# Patient Record
Sex: Female | Born: 1945 | Race: White | Hispanic: No | Marital: Married | Smoking: Never smoker
Health system: Southern US, Community
[De-identification: ages and names within clinical notes are randomized; demographics above are authoritative.]

## PROBLEM LIST (undated history)

## (undated) DIAGNOSIS — R011 Cardiac murmur, unspecified: Secondary | ICD-10-CM

## (undated) DIAGNOSIS — I4891 Unspecified atrial fibrillation: Secondary | ICD-10-CM

## (undated) DIAGNOSIS — I10 Essential (primary) hypertension: Secondary | ICD-10-CM

## (undated) HISTORY — DX: Unspecified atrial fibrillation: I48.91

## (undated) HISTORY — PX: ENDOMETRIAL BIOPSY: SHX622

## (undated) HISTORY — DX: Cardiac murmur, unspecified: R01.1

## (undated) HISTORY — PX: TOTAL KNEE ARTHROPLASTY: SHX125

## (undated) HISTORY — DX: Essential (primary) hypertension: I10

---

## 1997-12-19 ENCOUNTER — Encounter: Admission: RE | Admit: 1997-12-19 | Discharge: 1998-03-19 | Payer: Self-pay | Admitting: Internal Medicine

## 1998-01-30 ENCOUNTER — Ambulatory Visit (HOSPITAL_COMMUNITY): Admission: RE | Admit: 1998-01-30 | Discharge: 1998-01-31 | Payer: Self-pay | Admitting: General Surgery

## 1999-09-17 ENCOUNTER — Encounter: Payer: Self-pay | Admitting: Internal Medicine

## 1999-09-17 ENCOUNTER — Encounter: Admission: RE | Admit: 1999-09-17 | Discharge: 1999-09-17 | Payer: Self-pay | Admitting: Internal Medicine

## 2004-10-01 ENCOUNTER — Other Ambulatory Visit: Admission: RE | Admit: 2004-10-01 | Discharge: 2004-10-01 | Payer: Self-pay | Admitting: Obstetrics and Gynecology

## 2004-10-01 ENCOUNTER — Ambulatory Visit (HOSPITAL_COMMUNITY): Admission: RE | Admit: 2004-10-01 | Discharge: 2004-10-01 | Payer: Self-pay | Admitting: Internal Medicine

## 2008-07-08 ENCOUNTER — Ambulatory Visit (HOSPITAL_COMMUNITY): Admission: RE | Admit: 2008-07-08 | Discharge: 2008-07-08 | Payer: Self-pay | Admitting: Family Medicine

## 2008-09-05 HISTORY — PX: ROTATOR CUFF REPAIR: SHX139

## 2009-05-26 ENCOUNTER — Encounter (INDEPENDENT_AMBULATORY_CARE_PROVIDER_SITE_OTHER): Payer: Self-pay | Admitting: Obstetrics and Gynecology

## 2009-05-26 ENCOUNTER — Ambulatory Visit (HOSPITAL_COMMUNITY): Admission: RE | Admit: 2009-05-26 | Discharge: 2009-05-26 | Payer: Self-pay | Admitting: Obstetrics and Gynecology

## 2010-02-03 ENCOUNTER — Ambulatory Visit: Admission: RE | Admit: 2010-02-03 | Discharge: 2010-02-03 | Payer: Self-pay | Admitting: Gynecologic Oncology

## 2010-03-05 HISTORY — PX: ABDOMINAL HYSTERECTOMY: SHX81

## 2010-03-16 ENCOUNTER — Inpatient Hospital Stay (HOSPITAL_COMMUNITY): Admission: RE | Admit: 2010-03-16 | Discharge: 2010-03-19 | Payer: Self-pay | Admitting: Gynecologic Oncology

## 2010-03-16 ENCOUNTER — Encounter (INDEPENDENT_AMBULATORY_CARE_PROVIDER_SITE_OTHER): Payer: Self-pay | Admitting: Obstetrics and Gynecology

## 2010-03-17 ENCOUNTER — Encounter (INDEPENDENT_AMBULATORY_CARE_PROVIDER_SITE_OTHER): Payer: Self-pay | Admitting: Obstetrics and Gynecology

## 2010-04-22 ENCOUNTER — Ambulatory Visit: Admission: RE | Admit: 2010-04-22 | Discharge: 2010-04-22 | Payer: Self-pay | Admitting: Gynecologic Oncology

## 2010-05-24 ENCOUNTER — Inpatient Hospital Stay (HOSPITAL_COMMUNITY): Admission: RE | Admit: 2010-05-24 | Discharge: 2010-05-28 | Payer: Self-pay | Admitting: Orthopedic Surgery

## 2010-06-29 ENCOUNTER — Encounter
Admission: RE | Admit: 2010-06-29 | Discharge: 2010-08-04 | Payer: Self-pay | Source: Home / Self Care | Admitting: Orthopedic Surgery

## 2010-10-07 NOTE — H&P (Signed)
[name], [name]               ACCOUNT NO.:  [id number]  MEDICAL RECORD NO.:  [id number]          PATIENT TYPE:  INP  LOCATION:  NA                           FACILITY:  [facility]  PHYSICIAN:  Rozell Searing, [facility]    DATE OF BIRTH:  11-24-1945  DATE OF ADMISSION: DATE OF DISCHARGE:                             HISTORY & PHYSICAL   CHIEF COMPLAINT:  Right knee pain.  BRIEF HISTORY:  [name] has been followed by Dr. Lequita Halt for worsening pain in her right knee.  She has unfortunately failed conservative measures.  At this point, her right knee is keeping her from doing her daily activities.  She now presents for a right total knee arthroplasty.  MEDICATION ALLERGIES:  She has sensitivity to Codeine.  This causes nausea and vomiting.  Her allergy to sulfa drugs is a rash.  She has nausea and vomiting with Dilaudid IV.  However, she does fine with oral Dilaudid or with intravenous morphine.  PRIMARY CARE PHYSICIAN:  Dr. Sigmund Hazel  CURRENT MEDICATIONS: 1. Mobic 15 mg one tablet by mouth daily. 2. Lisinopril 20/25 mg one tablet by mouth daily. 3. Tylenol #4/Tramadol 32.5/37.5 one to two tablets by mouth every     four to six hours as needed for pain. 4. Dilaudid 2 mg one to two tablets by mouth every four to six hours     as needed for pain. 5. Zofran 4 mg one tablet by mouth every six hours as needed for     nausea.  PAST MEDICAL HISTORY: 1. Endstage arthritis of the right knee. 2. Hypertension. 3. Innocent heart murmur. 4. Arthritis. 5. History of bulging discs.  This was diagnosed by Dr. Ethelene Hal January     2012.  PAST SURGICAL HISTORY: 1. C-section in 1982. 2. Cholecystectomy in 1998. 3. Rotator cuff repair 2010. 4. Right knee medial meniscectomy 2010. 5. Total hysterectomy December of 2010. 6. Left total knee arthroplasty 2011.  FAMILY MEDICAL HISTORY:  Father passed at the age of 59.  He had heart disease.  Mother passed at the age of 37 of breast  cancer.  SOCIAL HISTORY:  The patient is divorced.  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  She denies past or present use of alcohol or tobacco products.  She has three children.  She lives at home.  She lives alone.  She does plan to go to skilled nursing facility following her hospital stay, and she would prefer [facility].  REASON FOR ADMISSION:  GENERAL:  Negative for fevers, chills or weight change. HEENT:  Positive for impaired vision.  The patient wears glasses. Negative for balance problems.  Negative for blackout spells. CARDIOVASCULAR:  Positive for heart murmur.  Negative for chest pain or palpitations. RESPIRATIONS:  Negative for shortness of breath at rest or with exertion. GASTROINTESTINAL:  Negative for nausea, vomiting or diarrhea. GENITOURINARY:  Negative for hematuria or dysuria. MUSCULOSKELETAL:  Positive for joint pain and swelling. DERMATOLOGIC:  Negative for rash or lesions.  PHYSICAL EXAMINATION:  VITAL SIGNS:  Pulse 70, respirations 18, blood pressure 130/80 in the left arm. GENERAL APPEARANCE:  Ms.  Clark is alert and oriented times three.  She is well-developed and well-nourished.  She does appear anxious on today's visit. HEENT:  Normocephalic, atraumatic.  Extraocular movements intact.  The patient wears glasses. NECK:  Supple with full range of motion without lymphadenopathy. CHEST:  Lungs are clear to auscultation bilaterally without wheezes, rhonchi or rales. HEART:  Regular rate and rhythm.  No murmur noted. ABDOMEN:  Bowel sounds present in all four quadrants.  Abdomen was soft, nontender and nondistended to palpation. EXTREMITIES:  Evaluation of the patient's right knee, she has pain with palpation over the medial joint line.  Decreased range of motion.  She has crepitus throughout the range of motion.  No instability was noted. No masses or tumors palpated in the popliteal space. SKIN:  Unremarkable. NEUROLOGIC:   Intact. PERIPHERAL VASCULAR:  Carotid pulses 2 plus bilaterally without bruits.  RADIOGRAPHS:  AP lateral views of the patient's right knee reveal endstage arthritis of the right knee, significant in the medial compartments as well as the patellofemoral.  IMPRESSION:  Endstage arthritis of the right knee.  PLAN:  Right total knee arthroplasty to be performed by Dr. Lequita Halt on October 19, 2010.  She will undergo all preoperative labs and testing at [facility].     Rozell Searing, [facility]     LD/MEDQ  D:  10/06/2010  T:  10/06/2010  Job:  [id number]  Electronically Signed by Rozell Searing  on 10/07/2010 11:48:59 AM

## 2010-10-11 ENCOUNTER — Encounter (HOSPITAL_COMMUNITY): Payer: PRIVATE HEALTH INSURANCE | Attending: Orthopedic Surgery

## 2010-10-11 LAB — APTT: aPTT: 36 seconds (ref 24–37)

## 2010-10-11 LAB — PROTIME-INR
INR: 1.05 (ref 0.00–1.49)
Prothrombin Time: 13.9 seconds (ref 11.6–15.2)

## 2010-10-11 LAB — COMPREHENSIVE METABOLIC PANEL
Alkaline Phosphatase: 127 U/L — ABNORMAL HIGH (ref 39–117)
BUN: 20 mg/dL (ref 6–23)
Calcium: 9.5 mg/dL (ref 8.4–10.5)
Creatinine, Ser: 0.88 mg/dL (ref 0.4–1.2)
Glucose, Bld: 89 mg/dL (ref 70–99)
Potassium: 4.4 mEq/L (ref 3.5–5.1)
Total Protein: 7.4 g/dL (ref 6.0–8.3)

## 2010-10-11 LAB — CBC
HCT: 39.7 % (ref 36.0–46.0)
Hemoglobin: 13.2 g/dL (ref 12.0–15.0)
MCH: 29.5 pg (ref 26.0–34.0)
MCHC: 33.2 g/dL (ref 30.0–36.0)
MCV: 88.6 fL (ref 78.0–100.0)
Platelets: 306 10*3/uL (ref 150–400)
RBC: 4.48 MIL/uL (ref 3.87–5.11)
RDW: 14 % (ref 11.5–15.5)
WBC: 11 10*3/uL — ABNORMAL HIGH (ref 4.0–10.5)

## 2010-10-11 LAB — SURGICAL PCR SCREEN
MRSA, PCR: NEGATIVE
Staphylococcus aureus: NEGATIVE

## 2010-10-12 LAB — URINE MICROSCOPIC-ADD ON

## 2010-10-12 LAB — URINALYSIS, ROUTINE W REFLEX MICROSCOPIC
Bilirubin Urine: NEGATIVE
Ketones, ur: NEGATIVE mg/dL
Leukocytes, UA: NEGATIVE
Nitrite: NEGATIVE
Specific Gravity, Urine: 1.023 (ref 1.005–1.030)
Urobilinogen, UA: 0.2 mg/dL (ref 0.0–1.0)

## 2010-10-19 ENCOUNTER — Inpatient Hospital Stay (HOSPITAL_COMMUNITY)
Admission: RE | Admit: 2010-10-19 | Discharge: 2010-10-23 | DRG: 470 | Disposition: A | Discharge status: Rehabilitation Facility | Payer: PRIVATE HEALTH INSURANCE | Attending: Orthopedic Surgery | Admitting: Orthopedic Surgery

## 2010-10-19 DIAGNOSIS — D62 Acute posthemorrhagic anemia: Secondary | ICD-10-CM | POA: Diagnosis not present

## 2010-10-19 DIAGNOSIS — I10 Essential (primary) hypertension: Secondary | ICD-10-CM | POA: Diagnosis present

## 2010-10-19 DIAGNOSIS — IMO0002 Reserved for concepts with insufficient information to code with codable children: Secondary | ICD-10-CM | POA: Diagnosis present

## 2010-10-19 DIAGNOSIS — M1710 Unilateral primary osteoarthritis, unspecified knee: Principal | ICD-10-CM | POA: Diagnosis present

## 2010-10-19 LAB — TYPE AND SCREEN
ABO/RH(D): A POS
Antibody Screen: NEGATIVE

## 2010-10-20 LAB — CBC
Platelets: 214 10*3/uL (ref 150–400)
RBC: 3.6 MIL/uL — ABNORMAL LOW (ref 3.87–5.11)
WBC: 8.9 10*3/uL (ref 4.0–10.5)

## 2010-10-20 LAB — BASIC METABOLIC PANEL
Chloride: 104 mEq/L (ref 96–112)
GFR calc Af Amer: >60 mL/min (ref >60)
Potassium: 4.4 mEq/L (ref 3.5–5.1)

## 2010-10-20 NOTE — Op Note (Signed)
NAMEAMRIT, ERCK               ACCOUNT NO.:  [id number]  MEDICAL RECORD NO.:  [id number]           PATIENT TYPE:  I  LOCATION:  1607                         FACILITY:  [facility]  PHYSICIAN:  Ollen Gross, M.D.    DATE OF BIRTH:  06-30-1946  DATE OF PROCEDURE:  10/19/2010 DATE OF DISCHARGE:                              OPERATIVE REPORT   PREOPERATIVE DIAGNOSIS:  Osteoarthritis, right knee.  POSTOPERATIVE DIAGNOSIS:  Osteoarthritis, right knee.  PROCEDURE:  Right total knee arthroplasty.  SURGEON:  Ollen Gross, M.D.  ASSISTANT:  Alexzandrew L. Perkins, P.A.C.  ANESTHESIA:  General.  ESTIMATED BLOOD LOSS:  Minimal.  DRAIN:  Hemovac x1.  TOURNIQUET TIME:  33 minutes and 300 mmHg.  COMPLICATIONS:  None.  CONDITION.:  Stable to recovery.  BRIEF CLINICAL NOTE:  [name] is a 65 year old female who had advanced arthritis of both knees.  She previously underwent a successful left total knee arthroplasty.  She had intractable pain in her right knee and presents now for right total knee arthroplasty.  PROCEDURE IN DETAIL:  After successful administration of general anesthetic, a tourniquet was placed high on her right thigh and her right lower extremity.  It was prepped and draped in usual sterile fashion.  Extremity was wrapped in Esmarch and knee flexed, tourniquet inflated to 300 mmHg.  Midline incision was made with #10 blade through subcutaneous tissue to the level of the extensor mechanism.  A fresh blade was used make a medial parapatellar arthrotomy.  Soft tissue on the proximal medial tibia subperiosteally elevated to the joint line with a knife into the semimembranosus bursa with a Cobb elevator.  Soft tissue laterally was elevated with attention being paid to avoiding the patellar tendon on tibial tubercle.  The patella was everted, knee flexed 90 degrees, and ACL and PCL removed.  Drill was used to create a starting hole in the distal femur and the canal was  thoroughly irrigated.  The 5-degree left valgus alignment guide was placed and the block was pinned to remove 10 mm off the distal femur.  Distal femoral resection was made with an oscillating saw.  Tibia subluxed forward and the menisci were removed.  The extramedullary tibial alignment guide was placed referencing proximally to medial aspect of the tibial tubercle and distally along the second metatarsal axis and tibial crest.  Block was pinned to remove 2 mm off the more deficient medial side.  Tibial resection was made with an oscillating saw.  Size 3 was most appropriate tibial component and the proximal tibia was prepared with modular drill and keel punch for the size 3.  The femoral sizing guide was placed, size 3 was most appropriate femur. Rotation was marked off the epicondylar axis and the size 3 cutting block was placed.  The rotation was confirmed by creating rectangular flexion gap at 90 degrees.  The anterior-posterior chamfer cuts were made.  Intercondylar block was placed and that cut was made.  Trial size 3 posterior stabilized femur was placed.  The size 10 mm posterior stabilized rotating platform insert trial was placed.  With the 10, full extension was achieved with excellent  varus-valgus and anterior- posterior balance throughout full range of motion.  The patella was everted and the thickness was measured to be 21 mm.  Freehand resection was taken to 12 mm, 35 template was placed, lug holes were drilled, trial patella was placed and it tracked normally.  Osteophytes were removed off the posterior femur with the trial in place.  All trials were removed and the cut bone surfaces prepared for pulsatile lavage. Cement was mixed and once ready for implantation, size 3 mobile bearing tibial tray, size 3 posterior stabilized femur and 35 patella were cemented into place.  Patella was held with a clamp.  Trial 10 mm insert was placed, knee held in full extension, all  extruded cement was removed.  When the cement fully hardened, then the permanent 10-mm posterior stabilized rotating platform femoral component was placed. The wound was then copiously irrigated with saline solution and the arthrotomy closed over Hemovac drain with interrupted #1 PDS.  Flexion against gravity to 135 degrees and patella tracked normally.  Tourniquet released after total time of 33 minutes.  The subcu was then closed with interrupted 2-0 Vicryl and subcuticular with running 4-0 Monocryl.  The catheter for Marcaine pain pump was placed and pump was initiated. Incisions cleaned and dried and Steri-Strips and a bulky sterile dressing were applied.  The leg was then placed into a knee immobilizer. She was awakened and transported to recovery in stable condition.     Ollen Gross, M.D.     FA/MEDQ  D:  10/19/2010  T:  10/20/2010  Job:  [id number]  Electronically Signed by Ollen Gross M.D. on 10/20/2010 02:55:01 PM

## 2010-10-21 LAB — CBC
HCT: 30.7 % — ABNORMAL LOW (ref 36.0–46.0)
Hemoglobin: 9.8 g/dL — ABNORMAL LOW (ref 12.0–15.0)
MCV: 91.9 fL (ref 78.0–100.0)
RBC: 3.34 MIL/uL — ABNORMAL LOW (ref 3.87–5.11)
WBC: 8.9 10*3/uL (ref 4.0–10.5)

## 2010-10-21 LAB — BASIC METABOLIC PANEL
BUN: 8 mg/dL (ref 6–23)
Chloride: 102 mEq/L (ref 96–112)
Glucose, Bld: 127 mg/dL — ABNORMAL HIGH (ref 70–99)
Potassium: 4.2 mEq/L (ref 3.5–5.1)

## 2010-10-22 ENCOUNTER — Inpatient Hospital Stay (HOSPITAL_COMMUNITY): Payer: PRIVATE HEALTH INSURANCE

## 2010-10-22 LAB — CBC
HCT: 29.8 % — ABNORMAL LOW (ref 36.0–46.0)
MCHC: 30.9 g/dL (ref 30.0–36.0)
MCV: 94.3 fL (ref 78.0–100.0)
RDW: 14 % (ref 11.5–15.5)
WBC: 8.1 10*3/uL (ref 4.0–10.5)

## 2010-10-27 NOTE — Discharge Summary (Signed)
[name], [name]               ACCOUNT NO.:  [id number]  MEDICAL RECORD NO.:  [id number]           PATIENT TYPE:  I  LOCATION:  1607                         FACILITY:  [facility]  PHYSICIAN:  Ollen Gross, M.D.    DATE OF BIRTH:  06-Jun-1946  DATE OF ADMISSION:  10/19/2010 DATE OF DISCHARGE:  10/22/2010                        DISCHARGE SUMMARY - REFERRING   ADMITTING DIAGNOSES: 1. Osteoarthritis, right knee. 2. Hypertension. 3. Heart murmur. 4. Osteoarthritis. 5. Degenerative disk disease/bulging disks.  DISCHARGE DIAGNOSES: 1. Osteoarthritis right knee, status post right total knee replacement     arthroplasty. 2. Postop acute blood loss anemia, did not require transfusion. 3. Hypertension. 4. Heart murmur. 5. Osteoarthritis. 6. Degenerative disk disease/bulging disks.  PROCEDURE:  October 19, 2010, right total knee.  Surgeon, Dr. Lequita Halt. Assistant, Patrica Duel, PA-C.  Anesthesia general.  Tourniquet time 33 minutes.  CONSULTS:  None.  BRIEF HISTORY:  The patient is a 65 year old female with end-stage arthritis of both knees, previously underwent a successful total knee and now presents for her right total knee.  LABORATORY DATA:  Preop CBC showed hemoglobin low of 13.2, hematocrit of 39.7, white cell count 11, platelets 306.  PT/INR 13.9 and 1.05 on admission with PTT of 36.  Chem panel on admission, elevated alkaline phosphatase of 127.  Remaining Chem panel all within normal limits. Preop UA, 0-2 white, 0-2 red, many bacteria, turbid color.  Blood group type A positive.  Nasal swabs were negative.  Staphylococcus aureus negative for MRSA.  Serial CBCs were followed, hemoglobin dropped down to 10.6 and 9.8, last done hemoglobin and hematocrit prior to discharge 9.2 and 29.8.  Serial BMETs were followed for 48 hours, glucose went up from 89 to 148 back down to 127, the remaining electrolytes remained within normal limits.  X-RAYS:  Two-view chest,  May 27, 2010, cardiomegaly without congestive heart failure, questionable pulmonary hyperinflation.  EKG dated April 30, 2010, sinus rhythm within normal limits and confirmed, did not see signature.  HOSPITAL COURSE:  The patient was admitted to [facility], taken to OR, and underwent above-stated procedure without complication. The patient tolerated procedure well, later transferred to recovery room and then to orthopedic floor.  Started on p.o. and IV analgesic for pain control.  Following surgery, given 24 hours postop IV antibiotics. Allowed to be weightbearing as tolerated.  She has had fair amount of pain through the night doing a little bit better on the morning of day #1, encouraged p.o. medications.  She did want to look into skilled nursing facility, so we got Social Work involved.  Hemoglobin was stable, looked good postop at 10.6.  She got up with therapy.  By day #2, she was doing a little bit better, pain was under better control, hemoglobin was 9.8, dressing was changed and incision looked good.  Kept her on low iron supplement for low hemoglobin.  FL-2 was sent out and beds were sought.  She was looking into [facility] and on the morning of day #3, we were waiting on final insurance approval; if it is approved, then the patient will be transferred at that time.  DISCHARGE PLAN: 1. Possible discharge to [facility] on October 22, 2010. 2. Discharge diagnoses, please see above. 3. Discharge medications, current medications at time of transfer     include Xarelto 10 mg p.o. daily.  She needs to stay on this for 12     more days, then discontinue the Xarelto.  Colace 100 mg p.o.     b.i.d.; Zofran 8 mg p.o. q.6 hours p.r.n. nausea; Nu-Iron 150 mg     p.o. daily for 2 more weeks, then discontinue the Nu-Iron; Tylenol     325 mg 1-2 every 4-6 hours as needed for mild pain, temperature, or     headache; Robaxin 500 mg p.o. q.6-8 hours p.r.n. spasm; Restoril  15     mg 1-2 p.o. nightly p.r.n. sleep; Dilaudid 2 mg 1 or 2 tablets     every 4 hours as needed for moderate pain. 4. Diet, as tolerated. 5. Activity, she is weightbearing as tolerated, total knee protocol.     Continue with PT and OT for gait training, ambulation, ADLs, range     of motion strengthening exercises for total knee protocol. 6. Followup.  The patient needs to follow up in the office on Tuesday,     February 28th, please contact the office at [phone] to help     arrange appointment followup care of this patient.  DISPOSITION:  Awaiting insurance approval, planned to go to skilled nursing facility.  CONDITION UPON DISCHARGE:  Improving, but final discharge is pending at insurance approval.     Alexzandrew L. Julien Girt, P.A.C.   ______________________________ Ollen Gross, M.D.    ALP/MEDQ  D:  10/22/2010  T:  10/22/2010  Job:  [id number]  cc:   Sigmund Hazel, M.D. Fax: [phone]  [facility]  Electronically Signed by Patrica Duel P.A.C. on 10/26/2010 07:47:34 AM Electronically Signed by Ollen Gross M.D. on 10/27/2010 04:47:44 PM

## 2010-11-18 LAB — CBC
HCT: 25.8 % — ABNORMAL LOW (ref 36.0–46.0)
HCT: 26.2 % — ABNORMAL LOW (ref 36.0–46.0)
HCT: 27.1 % — ABNORMAL LOW (ref 36.0–46.0)
Hemoglobin: 12.1 g/dL (ref 12.0–15.0)
Hemoglobin: 8.6 g/dL — ABNORMAL LOW (ref 12.0–15.0)
Hemoglobin: 8.8 g/dL — ABNORMAL LOW (ref 12.0–15.0)
Hemoglobin: 9.2 g/dL — ABNORMAL LOW (ref 12.0–15.0)
MCH: 29.4 pg (ref 26.0–34.0)
MCH: 29.5 pg (ref 26.0–34.0)
MCH: 29.5 pg (ref 26.0–34.0)
MCH: 29.8 pg (ref 26.0–34.0)
MCHC: 33.1 g/dL (ref 30.0–36.0)
MCHC: 33.3 g/dL (ref 30.0–36.0)
MCHC: 33.5 g/dL (ref 30.0–36.0)
MCHC: 33.8 g/dL (ref 30.0–36.0)
RBC: 3.1 MIL/uL — ABNORMAL LOW (ref 3.87–5.11)
RDW: 13.6 % (ref 11.5–15.5)
RDW: 14.3 % (ref 11.5–15.5)

## 2010-11-18 LAB — BASIC METABOLIC PANEL
BUN: 8 mg/dL (ref 6–23)
CO2: 31 mEq/L (ref 19–32)
Chloride: 106 mEq/L (ref 96–112)
Glucose, Bld: 116 mg/dL — ABNORMAL HIGH (ref 70–99)
Glucose, Bld: 143 mg/dL — ABNORMAL HIGH (ref 70–99)
Potassium: 4.3 mEq/L (ref 3.5–5.1)
Potassium: 4.3 mEq/L (ref 3.5–5.1)
Sodium: 139 mEq/L (ref 135–145)
Sodium: 141 mEq/L (ref 135–145)

## 2010-11-18 LAB — COMPREHENSIVE METABOLIC PANEL
AST: 24 U/L (ref 0–37)
CO2: 32 mEq/L (ref 19–32)
Calcium: 9.1 mg/dL (ref 8.4–10.5)
Creatinine, Ser: 0.72 mg/dL (ref 0.4–1.2)
GFR calc Af Amer: >60 mL/min (ref >60)
GFR calc non Af Amer: >60 mL/min (ref >60)

## 2010-11-18 LAB — APTT: aPTT: 35 seconds (ref 24–37)

## 2010-11-18 LAB — PROTIME-INR
INR: 1.07 (ref 0.00–1.49)
INR: 1.2 (ref 0.00–1.49)
INR: 1.45 (ref 0.00–1.49)
Prothrombin Time: 14.1 seconds (ref 11.6–15.2)

## 2010-11-18 LAB — URINALYSIS, ROUTINE W REFLEX MICROSCOPIC
Ketones, ur: NEGATIVE mg/dL
Nitrite: NEGATIVE
Protein, ur: NEGATIVE mg/dL
Urobilinogen, UA: 0.2 mg/dL (ref 0.0–1.0)

## 2010-11-21 LAB — BASIC METABOLIC PANEL
CO2: 29 mEq/L (ref 19–32)
Chloride: 104 mEq/L (ref 96–112)
Creatinine, Ser: 0.64 mg/dL (ref 0.4–1.2)
GFR calc Af Amer: >60 mL/min (ref >60)
Glucose, Bld: 164 mg/dL — ABNORMAL HIGH (ref 70–99)

## 2010-11-21 LAB — COMPREHENSIVE METABOLIC PANEL
Albumin: 3.7 g/dL (ref 3.5–5.2)
Alkaline Phosphatase: 100 U/L (ref 39–117)
BUN: 14 mg/dL (ref 6–23)
CO2: 33 mEq/L — ABNORMAL HIGH (ref 19–32)
Chloride: 102 mEq/L (ref 96–112)
Creatinine, Ser: 0.72 mg/dL (ref 0.4–1.2)
GFR calc non Af Amer: >60 mL/min (ref >60)
Glucose, Bld: 105 mg/dL — ABNORMAL HIGH (ref 70–99)
Potassium: 4.4 mEq/L (ref 3.5–5.1)
Total Bilirubin: 0.5 mg/dL (ref 0.3–1.2)

## 2010-11-21 LAB — DIFFERENTIAL
Basophils Absolute: 0 10*3/uL (ref 0.0–0.1)
Basophils Relative: 0 % (ref 0–1)
Monocytes Absolute: 0.3 10*3/uL (ref 0.1–1.0)
Neutro Abs: 4.1 10*3/uL (ref 1.7–7.7)
Neutrophils Relative %: 71 % (ref 43–77)

## 2010-11-21 LAB — CBC
HCT: 36.4 % (ref 36.0–46.0)
Hemoglobin: 10.7 g/dL — ABNORMAL LOW (ref 12.0–15.0)
Hemoglobin: 12.4 g/dL (ref 12.0–15.0)
MCH: 31 pg (ref 26.0–34.0)
MCH: 31 pg (ref 26.0–34.0)
MCV: 90.3 fL (ref 78.0–100.0)
MCV: 90.5 fL (ref 78.0–100.0)
RBC: 3.46 MIL/uL — ABNORMAL LOW (ref 3.87–5.11)
RBC: 4.02 MIL/uL (ref 3.87–5.11)
WBC: 5.8 10*3/uL (ref 4.0–10.5)

## 2010-11-21 LAB — ABO/RH: ABO/RH(D): A POS

## 2010-11-21 LAB — SURGICAL PCR SCREEN: Staphylococcus aureus: POSITIVE — AB

## 2010-11-24 ENCOUNTER — Ambulatory Visit: Payer: PRIVATE HEALTH INSURANCE | Attending: Orthopedic Surgery | Admitting: Physical Therapy

## 2010-11-24 DIAGNOSIS — IMO0001 Reserved for inherently not codable concepts without codable children: Secondary | ICD-10-CM | POA: Insufficient documentation

## 2010-11-24 DIAGNOSIS — Z96659 Presence of unspecified artificial knee joint: Secondary | ICD-10-CM | POA: Insufficient documentation

## 2010-11-24 DIAGNOSIS — M25569 Pain in unspecified knee: Secondary | ICD-10-CM | POA: Insufficient documentation

## 2010-11-24 DIAGNOSIS — M25669 Stiffness of unspecified knee, not elsewhere classified: Secondary | ICD-10-CM | POA: Insufficient documentation

## 2010-11-24 NOTE — Discharge Summary (Signed)
  [name], [name]               ACCOUNT NO.:  [id number]  MEDICAL RECORD NO.:  [id number]           PATIENT TYPE:  I  LOCATION:  1607                         FACILITY:  [facility]  PHYSICIAN:  Ollen Gross, M.D.    DATE OF BIRTH:  1946-06-24  DATE OF ADMISSION:  10/19/2010 DATE OF DISCHARGE:  10/23/2010                              DISCHARGE SUMMARY   ADDENDUM  ADMITTING AND DISCHARGE DIAGNOSIS:  Please see previously dictated summary.  PROCEDURE:  Right total knee.  HISTORY AND PREOPERATIVE LABORATORY DATA:  See previously dictated summary.  HOSPITAL COURSE:  There was a chance of possibility that the patient will be discharged to [facility] on Friday 17, 2012, however, bed was not available.  She had stay in the hospital another day.  She was not discharged until the following day, the Saturday, the 18th. Arrangements were being made.  Once she got final insurance approval and the bed was available, she was transferred over on October 23, 2010.  DISCHARGE MEDICATIONS/PLAN:  Please see previously dictated summary.  DISPOSITION:  Skilled nurse facility place.  CONDITION ON DISCHARGE:  Improved.     Alexzandrew L. Julien Girt, P.A.C.   ______________________________ Ollen Gross, M.D.    ALP/MEDQ  D:  11/09/2010  T:  11/10/2010  Job:  [id number]  cc:   Ollen Gross, M.D. Fax: [phone]  Sigmund Hazel, M.D. Fax: [phone]  [facility]  [employer]  Electronically Signed by Patrica Duel P.A.C. on 11/11/2010 10:35:38 AM Electronically Signed by Ollen Gross M.D. on 11/24/2010 08:16:37 AM

## 2010-11-25 ENCOUNTER — Ambulatory Visit: Payer: PRIVATE HEALTH INSURANCE | Admitting: Physical Therapy

## 2010-11-30 ENCOUNTER — Ambulatory Visit: Payer: PRIVATE HEALTH INSURANCE | Admitting: Physical Therapy

## 2010-12-02 ENCOUNTER — Ambulatory Visit: Payer: PRIVATE HEALTH INSURANCE | Attending: Orthopedic Surgery | Admitting: Physical Therapy

## 2010-12-02 DIAGNOSIS — IMO0001 Reserved for inherently not codable concepts without codable children: Secondary | ICD-10-CM | POA: Insufficient documentation

## 2010-12-02 DIAGNOSIS — M25569 Pain in unspecified knee: Secondary | ICD-10-CM | POA: Insufficient documentation

## 2010-12-02 DIAGNOSIS — M25669 Stiffness of unspecified knee, not elsewhere classified: Secondary | ICD-10-CM | POA: Insufficient documentation

## 2010-12-02 DIAGNOSIS — Z96659 Presence of unspecified artificial knee joint: Secondary | ICD-10-CM | POA: Insufficient documentation

## 2010-12-07 ENCOUNTER — Ambulatory Visit: Payer: PRIVATE HEALTH INSURANCE | Attending: Orthopedic Surgery | Admitting: Physical Therapy

## 2010-12-07 DIAGNOSIS — M25669 Stiffness of unspecified knee, not elsewhere classified: Secondary | ICD-10-CM | POA: Insufficient documentation

## 2010-12-07 DIAGNOSIS — IMO0001 Reserved for inherently not codable concepts without codable children: Secondary | ICD-10-CM | POA: Insufficient documentation

## 2010-12-07 DIAGNOSIS — Z96659 Presence of unspecified artificial knee joint: Secondary | ICD-10-CM | POA: Insufficient documentation

## 2010-12-07 DIAGNOSIS — M25569 Pain in unspecified knee: Secondary | ICD-10-CM | POA: Insufficient documentation

## 2010-12-10 LAB — BASIC METABOLIC PANEL
BUN: 9 mg/dL (ref 6–23)
CO2: 31 mEq/L (ref 19–32)
Chloride: 99 mEq/L (ref 96–112)
Glucose, Bld: 85 mg/dL (ref 70–99)
Potassium: 3.5 mEq/L (ref 3.5–5.1)

## 2010-12-10 LAB — URINALYSIS, ROUTINE W REFLEX MICROSCOPIC
Bilirubin Urine: NEGATIVE
Ketones, ur: NEGATIVE mg/dL
Nitrite: NEGATIVE
Protein, ur: NEGATIVE mg/dL
Urobilinogen, UA: 0.2 mg/dL (ref 0.0–1.0)

## 2010-12-10 LAB — CBC
HCT: 37.8 % (ref 36.0–46.0)
MCV: 91.2 fL (ref 78.0–100.0)
Platelets: 273 10*3/uL (ref 150–400)
RDW: 14.5 % (ref 11.5–15.5)

## 2010-12-14 ENCOUNTER — Ambulatory Visit: Payer: PRIVATE HEALTH INSURANCE | Admitting: Physical Therapy

## 2010-12-16 ENCOUNTER — Ambulatory Visit: Payer: PRIVATE HEALTH INSURANCE | Admitting: Physical Therapy

## 2010-12-21 ENCOUNTER — Encounter: Payer: PRIVATE HEALTH INSURANCE | Admitting: Physical Therapy

## 2011-06-07 LAB — CREATININE, SERUM: GFR calc non Af Amer: >60

## 2011-06-07 LAB — BUN: BUN: 11

## 2011-08-19 IMAGING — CR DG CHEST 2V
2 series · 2 of 2 positions shown · non-contrast
Comparison: 03/15/2010

CLINICAL DATA: Postop for knee arthroplasty.  Low saturation.
Cough.

CHEST - 2 VIEW

[w chest lat *]
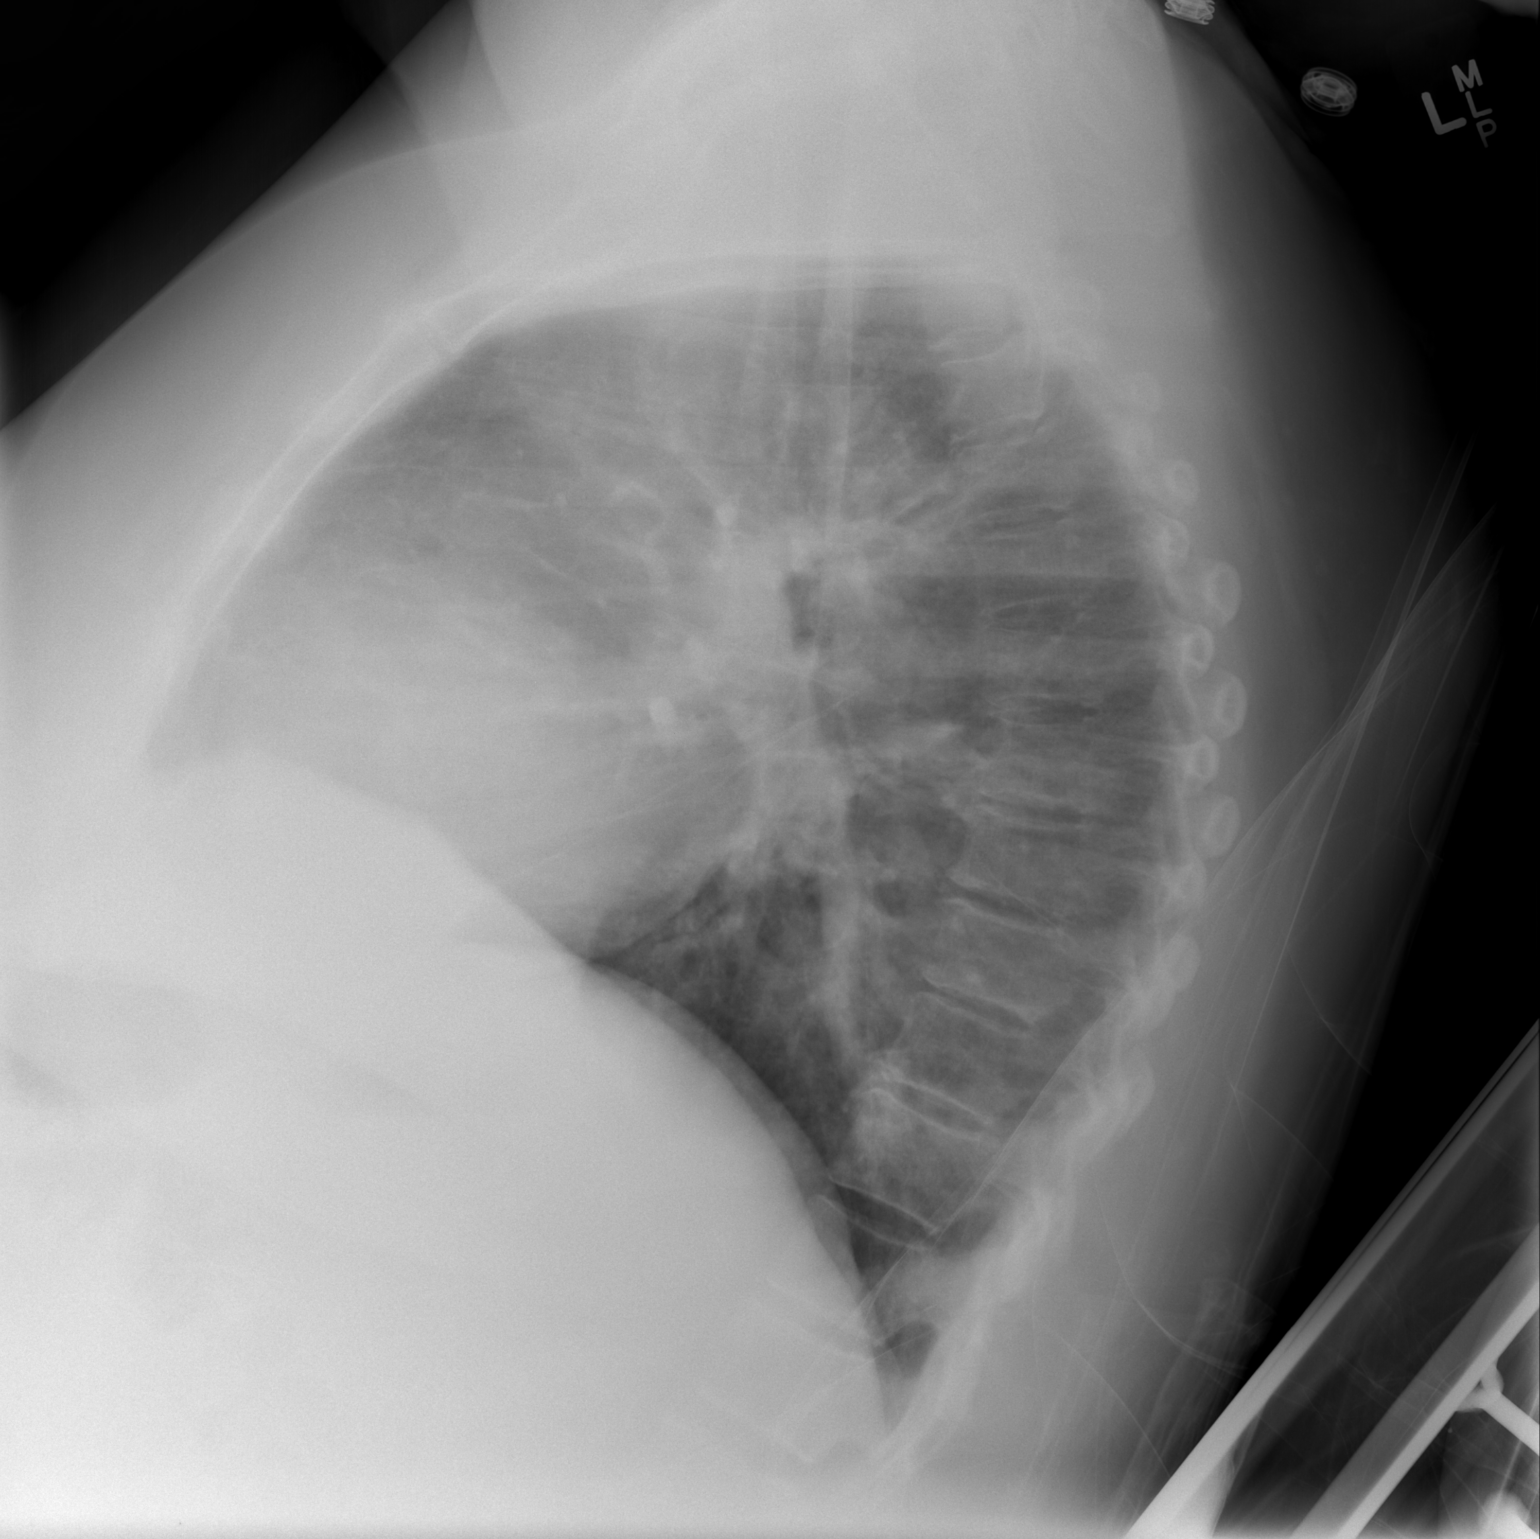

[view not recorded]
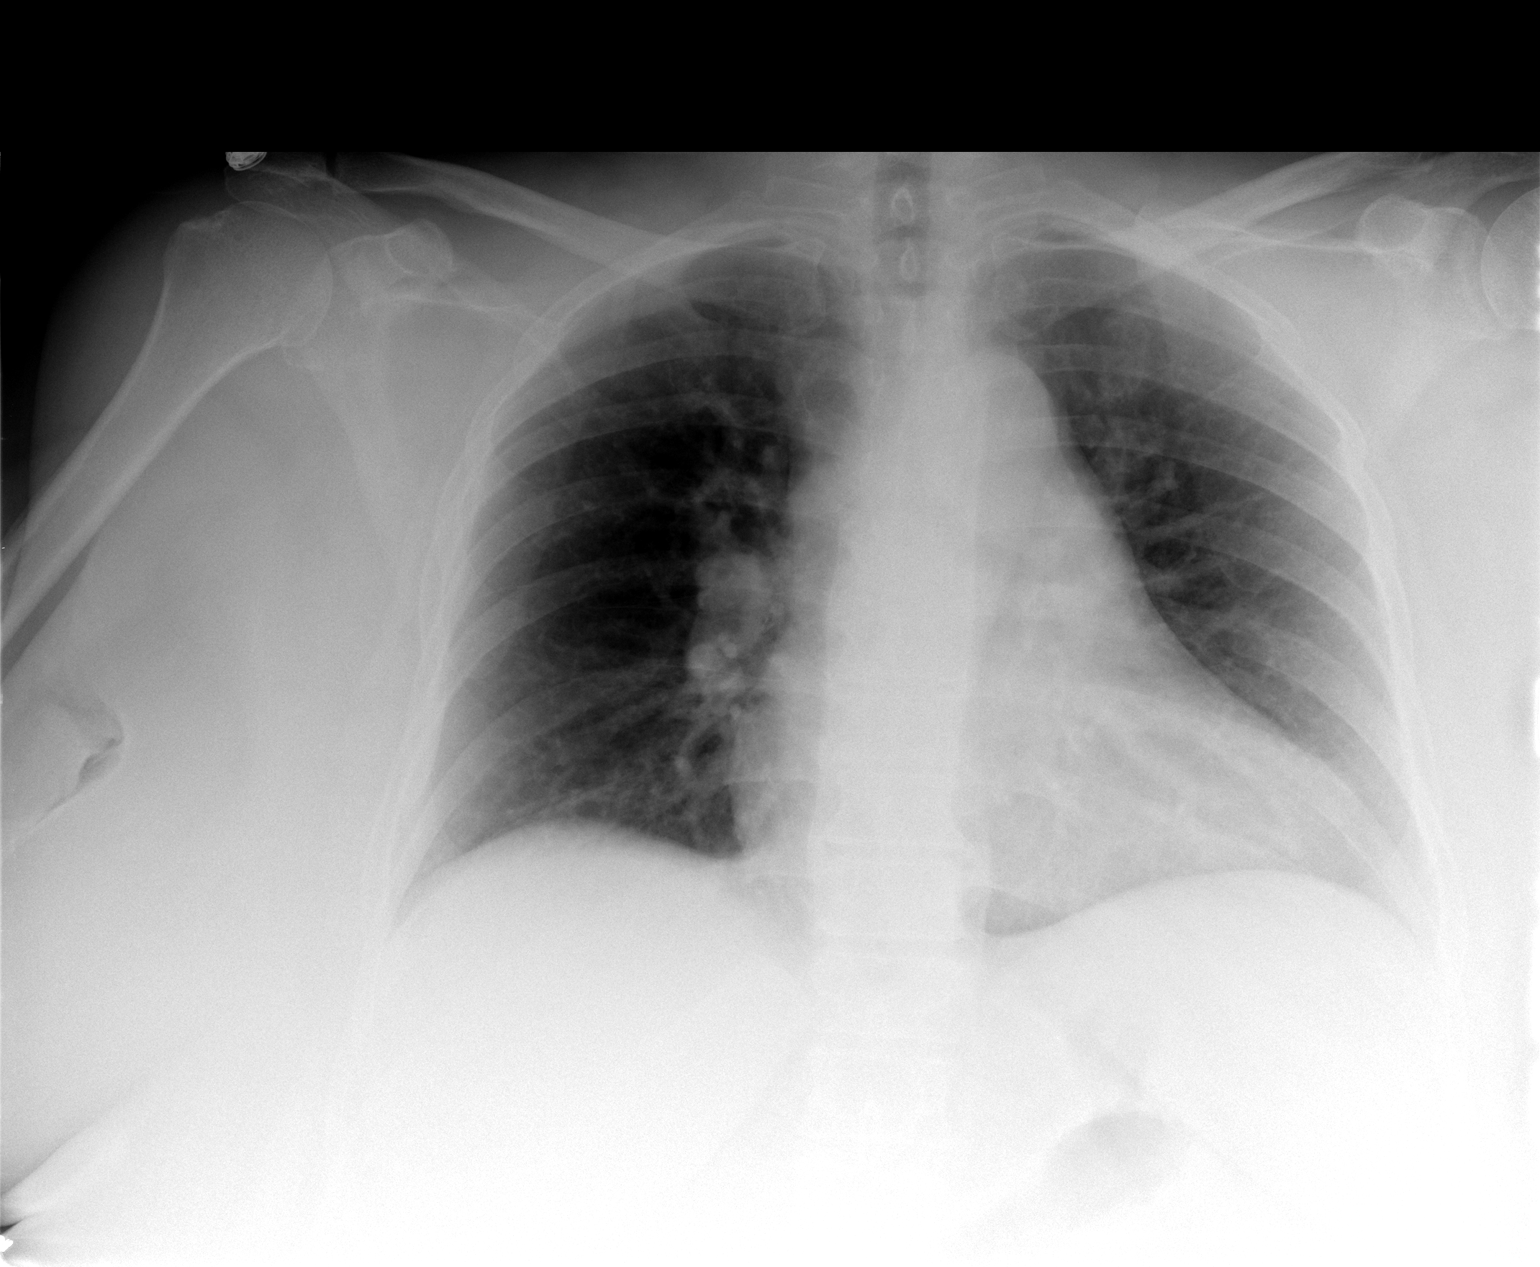

[2 of 2 positions shown; findings below may reference images not displayed]

FINDINGS: Increased A - P diameter of the chest suggests
hyperinflation.  Mid thoracic spondylosis is mild. Midline trachea.
Mild cardiomegaly. No pleural effusion or pneumothorax.  No
congestive failure.

Clear lungs.
IMPRESSION: 1. Cardiomegaly without congestive failure.
2.  Question pulmonary hyperinflation.

## 2013-09-16 ENCOUNTER — Encounter: Payer: Self-pay | Admitting: Obstetrics and Gynecology

## 2013-11-07 ENCOUNTER — Ambulatory Visit (INDEPENDENT_AMBULATORY_CARE_PROVIDER_SITE_OTHER): Payer: Commercial Managed Care - HMO | Admitting: Obstetrics and Gynecology

## 2013-11-07 ENCOUNTER — Encounter: Payer: Self-pay | Admitting: Obstetrics and Gynecology

## 2013-11-07 VITALS — BP 120/82 | HR 80 | Ht 61.5 in | Wt 248.0 lb

## 2013-11-07 DIAGNOSIS — Z01419 Encounter for gynecological examination (general) (routine) without abnormal findings: Secondary | ICD-10-CM

## 2013-11-07 DIAGNOSIS — Z13820 Encounter for screening for osteoporosis: Secondary | ICD-10-CM

## 2013-11-07 NOTE — Progress Notes (Signed)
Patient ID: [name], female   DOB: 07-Mar-1946, 68 y.o.   MRN: [id number] GYNECOLOGY VISIT  PCP:    Kathyrn Lass, MD  Referring provider:   HPI: 68 y.o.   Divorced  Caucasian  female   [phone] with No LMP recorded. Patient has had a hysterectomy.   here for  AEX.   Some urinary incontinence if coughs hard.   Always working on her weight.  Hgb:    PCP Urine:  Not done  GYNECOLOGIC HISTORY: No LMP recorded. Patient has had a hysterectomy. Sexually active:  no Partner preference: female Contraception:   Hysterectomy Menopausal hormone therapy: no DES exposure: no  Blood transfusions:  no  Sexually transmitted diseases:   no GYN procedures and prior surgeries:  TAH/BSO - complex atypical hyperplasia extending into adenomyosis, leiomyomata, focal endosalpingiosis of ovaries.  Last mammogram:  12/2009 wnl:[facility]               Last pap and high risk HPV testing:   03/2009 wnl:no HPV testing History of abnormal pap smear:  no   OB History   Grav Para Term Preterm Abortions TAB SAB Ect Mult Living   3 3 3       3        LIFESTYLE: Exercise:    no           Tobacco:    no Alcohol:      no Drug use:   no  OTHER HEALTH MAINTENANCE: Tetanus/TDap:    2008? Gardisil:               n/a Influenza:             2014 Zostavax:              never  Bone density:       never Colonoscopy:       Never, Will do in near future.   Cholesterol check:  09/2013 wnl with PCP  Family History  Problem Relation Age of Onset  . Breast cancer Mother   . Breast cancer Paternal Aunt   . Stroke Paternal Aunt     There are no active problems to display for this patient.  Past Medical History  Diagnosis Date  . Heart murmur     --innocent  . Hypertension     Past Surgical History  Procedure Laterality Date  . Abdominal hysterectomy  03/2010    TAH/BSO  . Total knee arthroplasty Bilateral   . Cesarean section  1981  . Rotator cuff repair Left 2010    ALLERGIES: Sulfa  antibiotics  Current Outpatient Prescriptions  Medication Sig Dispense Refill  . lisinopril-hydrochlorothiazide (PRINZIDE,ZESTORETIC) 20-25 MG per tablet Take 20-25 tablets by mouth daily.      . Multiple Vitamin (MULTIVITAMIN) capsule Take 1 capsule by mouth daily.      . vitamin B-12 (CYANOCOBALAMIN) 100 MCG tablet Take 100 mcg by mouth daily.       No current facility-administered medications for this visit.     ROS:  Pertinent items are noted in HPI.  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   PHYSICAL EXAMINATION:    BP 120/82  Pulse 80  Ht 5' 1.5" (1.562 m)  Wt 248 lb (112.492 kg)  BMI 46.11 kg/m2   Wt Readings from Last 3 Encounters:  11/07/13 248 lb (112.492 kg)     Ht Readings from Last 3 Encounters:  11/07/13 5' 1.5" (1.562 m)  General appearance: alert, cooperative and appears stated age Head: Normocephalic, without obvious abnormality, atraumatic Neck: no adenopathy, supple, symmetrical, trachea midline and thyroid not enlarged, symmetric, no tenderness/mass/nodules Lungs: clear to auscultation bilaterally Breasts: Inspection negative, No nipple retraction or dimpling, No nipple discharge or bleeding, No axillary or supraclavicular adenopathy, Normal to palpation without dominant masses Heart: regular rate and rhythm Abdomen: obese, soft, non-tender; no masses,  no organomegaly Extremities: extremities normal, atraumatic, no cyanosis or edema Skin: Skin color, texture, turgor normal. No rashes.  Multiple eschars on chest.  (Recent visit to dermatology.) Lymph nodes: Cervical, supraclavicular, and axillary nodes normal. No abnormal inguinal nodes palpated Neurologic: Grossly normal  Pelvic: External genitalia:  no lesions              Urethra:  normal appearing urethra with no masses, tenderness or lesions              Bartholins and Skenes: normal                 Vagina: normal appearing vagina with normal color and discharge, no lesions               Cervix: absent              Pap and high risk HPV testing done: no.            Bimanual Exam:  Uterus:  uterus is  Absent                                      Adnexa: nontender and no masses                                      Rectovaginal: Confirms                                      Anus:  normal sphincter tone, no lesions  ASSESSMENT  Normal gynecologic exam. Genuine stress incontinence.  PLAN  Mammogram recommended yearly.  Will schedule for patient. Pap smear and high risk HPV testing not indicted. Will schedule bone density for patient.  Counseled on self breast exam, exercise. Discussed Kegels, physical therapy and briefly mentioned surgery - the latter two declined.  Return annually or prn   An After Visit Summary was printed and given to the patient.

## 2013-11-07 NOTE — Patient Instructions (Signed)

## 2013-11-22 ENCOUNTER — Other Ambulatory Visit: Payer: PRIVATE HEALTH INSURANCE

## 2013-11-22 ENCOUNTER — Ambulatory Visit
Admission: RE | Admit: 2013-11-22 | Discharge: 2013-11-22 | Disposition: A | Discharge status: Home / Self Care | Payer: Commercial Managed Care - HMO | Source: Ambulatory Visit | Attending: Obstetrics and Gynecology | Admitting: Obstetrics and Gynecology

## 2013-11-22 DIAGNOSIS — Z01419 Encounter for gynecological examination (general) (routine) without abnormal findings: Secondary | ICD-10-CM

## 2014-01-30 ENCOUNTER — Telehealth: Payer: Self-pay | Admitting: Obstetrics and Gynecology

## 2014-01-30 NOTE — Telephone Encounter (Signed)
Calling patient to let her know records are here for her to pick up. Voicemail full unable to leave message. Left a automated call back number.

## 2014-07-07 ENCOUNTER — Encounter: Payer: Self-pay | Admitting: Obstetrics and Gynecology

## 2014-11-10 ENCOUNTER — Ambulatory Visit: Payer: Commercial Managed Care - HMO | Admitting: Obstetrics and Gynecology

## 2014-11-11 ENCOUNTER — Encounter: Payer: Self-pay | Admitting: Obstetrics and Gynecology

## 2015-03-17 ENCOUNTER — Telehealth: Payer: Self-pay | Admitting: Obstetrics and Gynecology

## 2015-03-17 NOTE — Telephone Encounter (Signed)
Left patient a message, details in staff message regarding dnka.

## 2015-04-07 ENCOUNTER — Encounter: Payer: Self-pay | Admitting: Obstetrics and Gynecology

## 2015-05-07 ENCOUNTER — Telehealth: Payer: Self-pay | Admitting: Obstetrics and Gynecology

## 2015-05-07 NOTE — Telephone Encounter (Signed)
LMTCB about canceled appointment °

## 2015-07-09 ENCOUNTER — Ambulatory Visit: Payer: Commercial Managed Care - HMO | Admitting: Obstetrics and Gynecology

## 2015-07-16 ENCOUNTER — Ambulatory Visit: Payer: Commercial Managed Care - HMO | Admitting: Obstetrics and Gynecology

## 2015-09-23 ENCOUNTER — Ambulatory Visit (INDEPENDENT_AMBULATORY_CARE_PROVIDER_SITE_OTHER): Payer: PPO | Admitting: Obstetrics and Gynecology

## 2015-09-23 ENCOUNTER — Encounter: Payer: Self-pay | Admitting: Obstetrics and Gynecology

## 2015-09-23 VITALS — BP 122/80 | HR 100 | Resp 20 | Ht 62.0 in | Wt 249.0 lb

## 2015-09-23 DIAGNOSIS — Z01419 Encounter for gynecological examination (general) (routine) without abnormal findings: Secondary | ICD-10-CM

## 2015-09-23 NOTE — Patient Instructions (Addendum)
[name],   I really encourage you to make the calls to the health department and social services to help your daughter get the care and resources she needs!  Erika Half, MD     EXERCISE AND DIET:  We recommended that you start or continue a regular exercise program for good health. Regular exercise means any activity that makes your heart beat faster and makes you sweat.  We recommend exercising at least 30 minutes per day at least 3 days a week, preferably 4 or 5.  We also recommend a diet low in fat and sugar.  Inactivity, poor dietary choices and obesity can cause diabetes, heart attack, stroke, and kidney damage, among others.    ALCOHOL AND SMOKING:  Women should limit their alcohol intake to no more than 7 drinks/beers/glasses of wine (combined, not each!) per week. Moderation of alcohol intake to this level decreases your risk of breast cancer and liver damage. And of course, no recreational drugs are part of a healthy lifestyle.  And absolutely no smoking or even second hand smoke. Most people know smoking can cause heart and lung diseases, but did you know it also contributes to weakening of your bones? Aging of your skin?  Yellowing of your teeth and nails?  CALCIUM AND VITAMIN D:  Adequate intake of calcium and Vitamin D are recommended.  The recommendations for exact amounts of these supplements seem to change often, but generally speaking 600 mg of calcium (either carbonate or citrate) and 800 units of Vitamin D per day seems prudent. Certain women may benefit from higher intake of Vitamin D.  If you are among these women, your doctor will have told you during your visit.    PAP SMEARS:  Pap smears, to check for cervical cancer or precancers,  have traditionally been done yearly, although recent scientific advances have shown that most women can have pap smears less often.  However, every woman still should have a physical exam from her gynecologist every year. It will include a breast  check, inspection of the vulva and vagina to check for abnormal growths or skin changes, a visual exam of the cervix, and then an exam to evaluate the size and shape of the uterus and ovaries.  And after 68 years of age, a rectal exam is indicated to check for rectal cancers. We will also provide age appropriate advice regarding health maintenance, like when you should have certain vaccines, screening for sexually transmitted diseases, bone density testing, colonoscopy, mammograms, etc.   MAMMOGRAMS:  All women over 68 years old should have a yearly mammogram. Many facilities now offer a "3D" mammogram, which may cost around $50 extra out of pocket. If possible,  we recommend you accept the option to have the 3D mammogram performed.  It both reduces the number of women who will be called back for extra views which then turn out to be normal, and it is better than the routine mammogram at detecting truly abnormal areas.    COLONOSCOPY:  Colonoscopy to screen for colon cancer is recommended for all women at age 68.  We know, you hate the idea of the prep.  We agree, BUT, having colon cancer and not knowing it is worse!!  Colon cancer so often starts as a polyp that can be seen and removed at colonscopy, which can quite literally save your life!  And if your first colonoscopy is normal and you have no family history of colon cancer, most women don't have to have  it again for 10 years.  Once every ten years, you can do something that may end up saving your life, right?  We will be happy to help you get it scheduled when you are ready.  Be sure to check your insurance coverage so you understand how much it will cost.  It may be covered as a preventative service at no cost, but you should check your particular policy.

## 2015-09-23 NOTE — Progress Notes (Signed)
Patient ID: [name], female   DOB: October 08, 1945, 70 y.o.   MRN: [id number] 70 y.o. [id number] Divorced Caucasian female here for annual exam.    Daughter not well and living with patient.  Patient with a lot of stress.  Still has urinary incontinene.  Leaks spontaneously.  Had a reaction to the Pneumovax injection.  Needs to get her Shingles vaccine.   Wants to start going to the [facility].  PCP: Kathyrn Lass, MD  - labs with PCP.   No LMP recorded. Patient has had a hysterectomy.          Sexually active: No. female The current method of family planning is status post hysterectomy.    Exercising: No.   Smoker:  no  Health Maintenance: Pap:  03/2009 Neg History of abnormal Pap:  no MMG:  11-22-13 3D/Density Cat.A/Neg/BiRads1:[facility] Colonoscopy:  NEVER BMD:   2015  Result  Normal with PCP([facility]) TDaP:  2010 Screening Labs:  Hb today: PCP, Urine today: PCP   reports that she has never smoked. She does not have any smokeless tobacco history on file. She reports that she does not drink alcohol.  Past Medical History  Diagnosis Date  . Heart murmur     --innocent  . Hypertension     Past Surgical History  Procedure Laterality Date  . Abdominal hysterectomy  03/2010    TAH/BSO  . Total knee arthroplasty Bilateral   . Cesarean section  1981  . Rotator cuff repair Left 2010  . 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 lisinopril-hydrochlorothiazide (PRINZIDE,ZESTORETIC) 20-25 MG per tablet Take 20-25 tablets by mouth daily.    . Multiple Vitamin (MULTIVITAMIN) capsule Take 1 capsule by mouth daily.    . ondansetron (ZOFRAN) 4 MG tablet Take 1 tablet by mouth as needed.  0  . vitamin B-12 (CYANOCOBALAMIN) 100 MCG tablet Take 100 mcg by mouth daily.     No current facility-administered medications for this visit.    Family History  Problem Relation Age of  Onset  . Breast cancer Mother   . Breast cancer Paternal Aunt   . Stroke Paternal Aunt     ROS:  Pertinent items are noted in HPI.  Otherwise, a comprehensive ROS was negative.  Exam:   BP 122/80 mmHg  Pulse 100  Resp 20  Ht 5\' 2"  (1.575 m)  Wt 249 lb (112.946 kg)  BMI 45.53 kg/m2    General appearance: alert, cooperative and appears stated age Head: Normocephalic, without obvious abnormality, atraumatic Neck: no adenopathy, supple, symmetrical, trachea midline and thyroid normal to inspection and palpation Lungs: clear to auscultation bilaterally Breasts: normal appearance, no masses or tenderness, Inspection negative, No nipple retraction or dimpling, No nipple discharge or bleeding, No axillary or supraclavicular adenopathy Heart: regular rate and rhythm Abdomen: central obesity, soft, non-tender; bowel sounds normal; no masses,  no organomegaly Extremities: extremities normal, atraumatic, no cyanosis or edema Skin: Skin color, texture, turgor normal. No rashes or lesions Lymph nodes: Cervical, supraclavicular, and axillary nodes normal. No abnormal inguinal nodes palpated Neurologic: Grossly normal  Pelvic: External genitalia:  no lesions              Urethra:  normal appearing urethra with no masses, tenderness or lesions              Bartholins and Skenes: normal  Vagina: normal appearing vagina with normal color and discharge, no lesions              Cervix: absent              Pap taken: No. Bimanual Exam:  Uterus:  uterus absent.  Minor scarring at vaginal apex.               Adnexa: no mass, fullness, tenderness and Exam limited by body habitus.              Rectovaginal: Yes.  .  Confirms.              Anus:  normal sphincter tone, no lesions  Chaperone was present for exam.  Assessment:   Well woman visit with normal exam. Status post TAH/BSO.  Urinary incontinence. No prior colonoscopy.   Plan: Yearly mammogram recommended after age 40.   Patient will schedule.  Recommended self breast exam.  Pap and HR HPV as above. Discussed Calcium, Vitamin D, regular exercise program including cardiovascular and weight bearing exercise.  Physical activity encouraged.  Labs performed.  No..    Refills given on medications.  No..   Patient will schedule her colonoscopy.  Support given to help daughter obtain health care, social services, and financial resources.  Follow up annually and prn.     After visit summary provided.

## 2015-11-05 DIAGNOSIS — R635 Abnormal weight gain: Secondary | ICD-10-CM | POA: Diagnosis not present

## 2015-11-05 DIAGNOSIS — N951 Menopausal and female climacteric states: Secondary | ICD-10-CM | POA: Diagnosis not present

## 2015-11-19 DIAGNOSIS — R7301 Impaired fasting glucose: Secondary | ICD-10-CM | POA: Diagnosis not present

## 2015-11-19 DIAGNOSIS — E538 Deficiency of other specified B group vitamins: Secondary | ICD-10-CM | POA: Diagnosis not present

## 2015-11-19 DIAGNOSIS — N958 Other specified menopausal and perimenopausal disorders: Secondary | ICD-10-CM | POA: Diagnosis not present

## 2015-11-19 DIAGNOSIS — Z6841 Body Mass Index (BMI) 40.0 and over, adult: Secondary | ICD-10-CM | POA: Diagnosis not present

## 2015-11-19 DIAGNOSIS — E039 Hypothyroidism, unspecified: Secondary | ICD-10-CM | POA: Diagnosis not present

## 2015-12-03 DIAGNOSIS — Z6841 Body Mass Index (BMI) 40.0 and over, adult: Secondary | ICD-10-CM | POA: Diagnosis not present

## 2015-12-03 DIAGNOSIS — E039 Hypothyroidism, unspecified: Secondary | ICD-10-CM | POA: Diagnosis not present

## 2015-12-03 DIAGNOSIS — E538 Deficiency of other specified B group vitamins: Secondary | ICD-10-CM | POA: Diagnosis not present

## 2015-12-03 DIAGNOSIS — R7301 Impaired fasting glucose: Secondary | ICD-10-CM | POA: Diagnosis not present

## 2015-12-11 DIAGNOSIS — Z6841 Body Mass Index (BMI) 40.0 and over, adult: Secondary | ICD-10-CM | POA: Diagnosis not present

## 2015-12-11 DIAGNOSIS — E538 Deficiency of other specified B group vitamins: Secondary | ICD-10-CM | POA: Diagnosis not present

## 2015-12-11 DIAGNOSIS — R7301 Impaired fasting glucose: Secondary | ICD-10-CM | POA: Diagnosis not present

## 2015-12-11 DIAGNOSIS — E039 Hypothyroidism, unspecified: Secondary | ICD-10-CM | POA: Diagnosis not present

## 2015-12-17 DIAGNOSIS — Z6841 Body Mass Index (BMI) 40.0 and over, adult: Secondary | ICD-10-CM | POA: Diagnosis not present

## 2015-12-17 DIAGNOSIS — R7301 Impaired fasting glucose: Secondary | ICD-10-CM | POA: Diagnosis not present

## 2015-12-17 DIAGNOSIS — E538 Deficiency of other specified B group vitamins: Secondary | ICD-10-CM | POA: Diagnosis not present

## 2015-12-17 DIAGNOSIS — E039 Hypothyroidism, unspecified: Secondary | ICD-10-CM | POA: Diagnosis not present

## 2015-12-24 DIAGNOSIS — E039 Hypothyroidism, unspecified: Secondary | ICD-10-CM | POA: Diagnosis not present

## 2015-12-24 DIAGNOSIS — E538 Deficiency of other specified B group vitamins: Secondary | ICD-10-CM | POA: Diagnosis not present

## 2015-12-24 DIAGNOSIS — R7301 Impaired fasting glucose: Secondary | ICD-10-CM | POA: Diagnosis not present

## 2015-12-24 DIAGNOSIS — Z6841 Body Mass Index (BMI) 40.0 and over, adult: Secondary | ICD-10-CM | POA: Diagnosis not present

## 2015-12-31 DIAGNOSIS — E538 Deficiency of other specified B group vitamins: Secondary | ICD-10-CM | POA: Diagnosis not present

## 2015-12-31 DIAGNOSIS — Z6841 Body Mass Index (BMI) 40.0 and over, adult: Secondary | ICD-10-CM | POA: Diagnosis not present

## 2015-12-31 DIAGNOSIS — N958 Other specified menopausal and perimenopausal disorders: Secondary | ICD-10-CM | POA: Diagnosis not present

## 2015-12-31 DIAGNOSIS — E039 Hypothyroidism, unspecified: Secondary | ICD-10-CM | POA: Diagnosis not present

## 2015-12-31 DIAGNOSIS — R7301 Impaired fasting glucose: Secondary | ICD-10-CM | POA: Diagnosis not present

## 2016-01-04 ENCOUNTER — Other Ambulatory Visit: Payer: Self-pay

## 2016-01-04 DIAGNOSIS — Z1231 Encounter for screening mammogram for malignant neoplasm of breast: Secondary | ICD-10-CM

## 2016-01-07 DIAGNOSIS — R7301 Impaired fasting glucose: Secondary | ICD-10-CM | POA: Diagnosis not present

## 2016-01-07 DIAGNOSIS — E039 Hypothyroidism, unspecified: Secondary | ICD-10-CM | POA: Diagnosis not present

## 2016-01-07 DIAGNOSIS — E538 Deficiency of other specified B group vitamins: Secondary | ICD-10-CM | POA: Diagnosis not present

## 2016-01-07 DIAGNOSIS — Z6841 Body Mass Index (BMI) 40.0 and over, adult: Secondary | ICD-10-CM | POA: Diagnosis not present

## 2016-01-14 DIAGNOSIS — R7301 Impaired fasting glucose: Secondary | ICD-10-CM | POA: Diagnosis not present

## 2016-01-14 DIAGNOSIS — E538 Deficiency of other specified B group vitamins: Secondary | ICD-10-CM | POA: Diagnosis not present

## 2016-01-14 DIAGNOSIS — Z6841 Body Mass Index (BMI) 40.0 and over, adult: Secondary | ICD-10-CM | POA: Diagnosis not present

## 2016-01-14 DIAGNOSIS — H9319 Tinnitus, unspecified ear: Secondary | ICD-10-CM | POA: Diagnosis not present

## 2016-01-14 DIAGNOSIS — H6980 Other specified disorders of Eustachian tube, unspecified ear: Secondary | ICD-10-CM | POA: Diagnosis not present

## 2016-01-14 DIAGNOSIS — E039 Hypothyroidism, unspecified: Secondary | ICD-10-CM | POA: Diagnosis not present

## 2016-01-14 DIAGNOSIS — I10 Essential (primary) hypertension: Secondary | ICD-10-CM | POA: Diagnosis not present

## 2016-01-21 DIAGNOSIS — Z6841 Body Mass Index (BMI) 40.0 and over, adult: Secondary | ICD-10-CM | POA: Diagnosis not present

## 2016-01-21 DIAGNOSIS — R7301 Impaired fasting glucose: Secondary | ICD-10-CM | POA: Diagnosis not present

## 2016-01-21 DIAGNOSIS — E039 Hypothyroidism, unspecified: Secondary | ICD-10-CM | POA: Diagnosis not present

## 2016-01-21 DIAGNOSIS — E538 Deficiency of other specified B group vitamins: Secondary | ICD-10-CM | POA: Diagnosis not present

## 2016-01-28 ENCOUNTER — Ambulatory Visit
Admission: RE | Admit: 2016-01-28 | Discharge: 2016-01-28 | Disposition: A | Discharge status: Home / Self Care | Payer: PPO | Source: Ambulatory Visit

## 2016-01-28 DIAGNOSIS — Z6841 Body Mass Index (BMI) 40.0 and over, adult: Secondary | ICD-10-CM | POA: Diagnosis not present

## 2016-01-28 DIAGNOSIS — R7301 Impaired fasting glucose: Secondary | ICD-10-CM | POA: Diagnosis not present

## 2016-01-28 DIAGNOSIS — E538 Deficiency of other specified B group vitamins: Secondary | ICD-10-CM | POA: Diagnosis not present

## 2016-01-28 DIAGNOSIS — Z1231 Encounter for screening mammogram for malignant neoplasm of breast: Secondary | ICD-10-CM

## 2016-01-28 DIAGNOSIS — E039 Hypothyroidism, unspecified: Secondary | ICD-10-CM | POA: Diagnosis not present

## 2016-02-04 DIAGNOSIS — Z6841 Body Mass Index (BMI) 40.0 and over, adult: Secondary | ICD-10-CM | POA: Diagnosis not present

## 2016-02-04 DIAGNOSIS — E039 Hypothyroidism, unspecified: Secondary | ICD-10-CM | POA: Diagnosis not present

## 2016-02-04 DIAGNOSIS — E538 Deficiency of other specified B group vitamins: Secondary | ICD-10-CM | POA: Diagnosis not present

## 2016-02-04 DIAGNOSIS — R7301 Impaired fasting glucose: Secondary | ICD-10-CM | POA: Diagnosis not present

## 2016-02-11 DIAGNOSIS — E538 Deficiency of other specified B group vitamins: Secondary | ICD-10-CM | POA: Diagnosis not present

## 2016-02-11 DIAGNOSIS — R7301 Impaired fasting glucose: Secondary | ICD-10-CM | POA: Diagnosis not present

## 2016-02-11 DIAGNOSIS — E039 Hypothyroidism, unspecified: Secondary | ICD-10-CM | POA: Diagnosis not present

## 2016-02-11 DIAGNOSIS — Z6841 Body Mass Index (BMI) 40.0 and over, adult: Secondary | ICD-10-CM | POA: Diagnosis not present

## 2016-02-18 DIAGNOSIS — E039 Hypothyroidism, unspecified: Secondary | ICD-10-CM | POA: Diagnosis not present

## 2016-02-18 DIAGNOSIS — Z6841 Body Mass Index (BMI) 40.0 and over, adult: Secondary | ICD-10-CM | POA: Diagnosis not present

## 2016-02-18 DIAGNOSIS — R7301 Impaired fasting glucose: Secondary | ICD-10-CM | POA: Diagnosis not present

## 2016-02-18 DIAGNOSIS — E538 Deficiency of other specified B group vitamins: Secondary | ICD-10-CM | POA: Diagnosis not present

## 2016-02-18 DIAGNOSIS — N958 Other specified menopausal and perimenopausal disorders: Secondary | ICD-10-CM | POA: Diagnosis not present

## 2016-02-25 DIAGNOSIS — E039 Hypothyroidism, unspecified: Secondary | ICD-10-CM | POA: Diagnosis not present

## 2016-02-25 DIAGNOSIS — R7301 Impaired fasting glucose: Secondary | ICD-10-CM | POA: Diagnosis not present

## 2016-02-25 DIAGNOSIS — Z6841 Body Mass Index (BMI) 40.0 and over, adult: Secondary | ICD-10-CM | POA: Diagnosis not present

## 2016-02-25 DIAGNOSIS — S2341XA Sprain of ribs, initial encounter: Secondary | ICD-10-CM | POA: Diagnosis not present

## 2016-02-25 DIAGNOSIS — E538 Deficiency of other specified B group vitamins: Secondary | ICD-10-CM | POA: Diagnosis not present

## 2016-03-03 DIAGNOSIS — E538 Deficiency of other specified B group vitamins: Secondary | ICD-10-CM | POA: Diagnosis not present

## 2016-03-03 DIAGNOSIS — Z6841 Body Mass Index (BMI) 40.0 and over, adult: Secondary | ICD-10-CM | POA: Diagnosis not present

## 2016-03-03 DIAGNOSIS — R7301 Impaired fasting glucose: Secondary | ICD-10-CM | POA: Diagnosis not present

## 2016-03-03 DIAGNOSIS — E039 Hypothyroidism, unspecified: Secondary | ICD-10-CM | POA: Diagnosis not present

## 2016-03-17 DIAGNOSIS — Z6841 Body Mass Index (BMI) 40.0 and over, adult: Secondary | ICD-10-CM | POA: Diagnosis not present

## 2016-03-17 DIAGNOSIS — R7301 Impaired fasting glucose: Secondary | ICD-10-CM | POA: Diagnosis not present

## 2016-03-17 DIAGNOSIS — E538 Deficiency of other specified B group vitamins: Secondary | ICD-10-CM | POA: Diagnosis not present

## 2016-03-17 DIAGNOSIS — E039 Hypothyroidism, unspecified: Secondary | ICD-10-CM | POA: Diagnosis not present

## 2016-03-24 DIAGNOSIS — Z6841 Body Mass Index (BMI) 40.0 and over, adult: Secondary | ICD-10-CM | POA: Diagnosis not present

## 2016-03-24 DIAGNOSIS — E538 Deficiency of other specified B group vitamins: Secondary | ICD-10-CM | POA: Diagnosis not present

## 2016-03-31 DIAGNOSIS — R7301 Impaired fasting glucose: Secondary | ICD-10-CM | POA: Diagnosis not present

## 2016-03-31 DIAGNOSIS — E039 Hypothyroidism, unspecified: Secondary | ICD-10-CM | POA: Diagnosis not present

## 2016-03-31 DIAGNOSIS — Z6841 Body Mass Index (BMI) 40.0 and over, adult: Secondary | ICD-10-CM | POA: Diagnosis not present

## 2016-03-31 DIAGNOSIS — E538 Deficiency of other specified B group vitamins: Secondary | ICD-10-CM | POA: Diagnosis not present

## 2016-04-07 DIAGNOSIS — E538 Deficiency of other specified B group vitamins: Secondary | ICD-10-CM | POA: Diagnosis not present

## 2016-04-15 DIAGNOSIS — Z6839 Body mass index (BMI) 39.0-39.9, adult: Secondary | ICD-10-CM | POA: Diagnosis not present

## 2016-04-15 DIAGNOSIS — E538 Deficiency of other specified B group vitamins: Secondary | ICD-10-CM | POA: Diagnosis not present

## 2016-04-15 DIAGNOSIS — R7301 Impaired fasting glucose: Secondary | ICD-10-CM | POA: Diagnosis not present

## 2016-04-15 DIAGNOSIS — E039 Hypothyroidism, unspecified: Secondary | ICD-10-CM | POA: Diagnosis not present

## 2016-04-21 DIAGNOSIS — E538 Deficiency of other specified B group vitamins: Secondary | ICD-10-CM | POA: Diagnosis not present

## 2016-04-28 DIAGNOSIS — E039 Hypothyroidism, unspecified: Secondary | ICD-10-CM | POA: Diagnosis not present

## 2016-04-28 DIAGNOSIS — R7301 Impaired fasting glucose: Secondary | ICD-10-CM | POA: Diagnosis not present

## 2016-04-28 DIAGNOSIS — Z6839 Body mass index (BMI) 39.0-39.9, adult: Secondary | ICD-10-CM | POA: Diagnosis not present

## 2016-04-28 DIAGNOSIS — E538 Deficiency of other specified B group vitamins: Secondary | ICD-10-CM | POA: Diagnosis not present

## 2016-05-05 DIAGNOSIS — E538 Deficiency of other specified B group vitamins: Secondary | ICD-10-CM | POA: Diagnosis not present

## 2016-05-13 DIAGNOSIS — E538 Deficiency of other specified B group vitamins: Secondary | ICD-10-CM | POA: Diagnosis not present

## 2016-05-19 DIAGNOSIS — E538 Deficiency of other specified B group vitamins: Secondary | ICD-10-CM | POA: Diagnosis not present

## 2016-05-19 DIAGNOSIS — Z6839 Body mass index (BMI) 39.0-39.9, adult: Secondary | ICD-10-CM | POA: Diagnosis not present

## 2016-05-19 DIAGNOSIS — E039 Hypothyroidism, unspecified: Secondary | ICD-10-CM | POA: Diagnosis not present

## 2016-05-19 DIAGNOSIS — R7301 Impaired fasting glucose: Secondary | ICD-10-CM | POA: Diagnosis not present

## 2016-05-26 DIAGNOSIS — E538 Deficiency of other specified B group vitamins: Secondary | ICD-10-CM | POA: Diagnosis not present

## 2016-08-18 DIAGNOSIS — Z6841 Body Mass Index (BMI) 40.0 and over, adult: Secondary | ICD-10-CM | POA: Diagnosis not present

## 2016-08-18 DIAGNOSIS — I10 Essential (primary) hypertension: Secondary | ICD-10-CM | POA: Diagnosis not present

## 2016-10-07 ENCOUNTER — Ambulatory Visit: Payer: PPO | Admitting: Obstetrics and Gynecology

## 2016-10-07 DIAGNOSIS — E538 Deficiency of other specified B group vitamins: Secondary | ICD-10-CM | POA: Diagnosis not present

## 2016-10-12 DIAGNOSIS — R7301 Impaired fasting glucose: Secondary | ICD-10-CM | POA: Diagnosis not present

## 2016-10-12 DIAGNOSIS — Z6841 Body Mass Index (BMI) 40.0 and over, adult: Secondary | ICD-10-CM | POA: Diagnosis not present

## 2016-10-12 DIAGNOSIS — E039 Hypothyroidism, unspecified: Secondary | ICD-10-CM | POA: Diagnosis not present

## 2016-10-12 DIAGNOSIS — E538 Deficiency of other specified B group vitamins: Secondary | ICD-10-CM | POA: Diagnosis not present

## 2017-05-25 DIAGNOSIS — E785 Hyperlipidemia, unspecified: Secondary | ICD-10-CM | POA: Diagnosis not present

## 2017-05-25 DIAGNOSIS — Z1159 Encounter for screening for other viral diseases: Secondary | ICD-10-CM | POA: Diagnosis not present

## 2017-05-25 DIAGNOSIS — E049 Nontoxic goiter, unspecified: Secondary | ICD-10-CM | POA: Diagnosis not present

## 2017-05-29 DIAGNOSIS — E049 Nontoxic goiter, unspecified: Secondary | ICD-10-CM | POA: Diagnosis not present

## 2017-05-29 DIAGNOSIS — I10 Essential (primary) hypertension: Secondary | ICD-10-CM | POA: Diagnosis not present

## 2017-05-29 DIAGNOSIS — Z23 Encounter for immunization: Secondary | ICD-10-CM | POA: Diagnosis not present

## 2017-05-29 DIAGNOSIS — Z6841 Body Mass Index (BMI) 40.0 and over, adult: Secondary | ICD-10-CM | POA: Diagnosis not present

## 2017-05-29 DIAGNOSIS — Z0000 Encounter for general adult medical examination without abnormal findings: Secondary | ICD-10-CM | POA: Diagnosis not present

## 2017-05-29 DIAGNOSIS — H9319 Tinnitus, unspecified ear: Secondary | ICD-10-CM | POA: Diagnosis not present

## 2017-05-29 DIAGNOSIS — Z1389 Encounter for screening for other disorder: Secondary | ICD-10-CM | POA: Diagnosis not present

## 2017-05-29 DIAGNOSIS — R7309 Other abnormal glucose: Secondary | ICD-10-CM | POA: Diagnosis not present

## 2017-12-14 DIAGNOSIS — K2900 Acute gastritis without bleeding: Secondary | ICD-10-CM | POA: Diagnosis not present

## 2018-01-08 DIAGNOSIS — F419 Anxiety disorder, unspecified: Secondary | ICD-10-CM | POA: Diagnosis not present

## 2018-01-08 DIAGNOSIS — I10 Essential (primary) hypertension: Secondary | ICD-10-CM | POA: Diagnosis not present

## 2018-01-08 DIAGNOSIS — K219 Gastro-esophageal reflux disease without esophagitis: Secondary | ICD-10-CM | POA: Diagnosis not present

## 2018-01-08 DIAGNOSIS — J4520 Mild intermittent asthma, uncomplicated: Secondary | ICD-10-CM | POA: Diagnosis not present

## 2018-02-05 DIAGNOSIS — Z6841 Body Mass Index (BMI) 40.0 and over, adult: Secondary | ICD-10-CM | POA: Diagnosis not present

## 2018-02-05 DIAGNOSIS — R05 Cough: Secondary | ICD-10-CM | POA: Diagnosis not present

## 2018-02-05 DIAGNOSIS — K219 Gastro-esophageal reflux disease without esophagitis: Secondary | ICD-10-CM | POA: Diagnosis not present

## 2018-02-05 DIAGNOSIS — I10 Essential (primary) hypertension: Secondary | ICD-10-CM | POA: Diagnosis not present

## 2018-02-19 DIAGNOSIS — M5412 Radiculopathy, cervical region: Secondary | ICD-10-CM | POA: Diagnosis not present

## 2018-02-19 DIAGNOSIS — M5030 Other cervical disc degeneration, unspecified cervical region: Secondary | ICD-10-CM | POA: Diagnosis not present

## 2018-02-19 DIAGNOSIS — M79602 Pain in left arm: Secondary | ICD-10-CM | POA: Diagnosis not present

## 2018-02-23 DIAGNOSIS — M5412 Radiculopathy, cervical region: Secondary | ICD-10-CM

## 2018-02-23 HISTORY — DX: Radiculopathy, cervical region: M54.12

## 2018-03-12 DIAGNOSIS — M5412 Radiculopathy, cervical region: Secondary | ICD-10-CM | POA: Diagnosis not present

## 2018-03-15 DIAGNOSIS — M5412 Radiculopathy, cervical region: Secondary | ICD-10-CM | POA: Diagnosis not present

## 2018-03-20 DIAGNOSIS — M5412 Radiculopathy, cervical region: Secondary | ICD-10-CM | POA: Diagnosis not present

## 2018-03-23 DIAGNOSIS — M5030 Other cervical disc degeneration, unspecified cervical region: Secondary | ICD-10-CM

## 2018-03-23 HISTORY — DX: Other cervical disc degeneration, unspecified cervical region: M50.30

## 2018-04-04 DIAGNOSIS — M5412 Radiculopathy, cervical region: Secondary | ICD-10-CM | POA: Diagnosis not present

## 2018-04-10 ENCOUNTER — Other Ambulatory Visit: Payer: Self-pay | Admitting: Physical Medicine and Rehabilitation

## 2018-04-10 DIAGNOSIS — M5412 Radiculopathy, cervical region: Secondary | ICD-10-CM

## 2018-04-10 DIAGNOSIS — Z77018 Contact with and (suspected) exposure to other hazardous metals: Secondary | ICD-10-CM

## 2018-04-16 ENCOUNTER — Ambulatory Visit
Admission: RE | Admit: 2018-04-16 | Discharge: 2018-04-16 | Disposition: A | Discharge status: Home / Self Care | Payer: PPO | Source: Ambulatory Visit | Attending: Physical Medicine and Rehabilitation | Admitting: Physical Medicine and Rehabilitation

## 2018-04-16 DIAGNOSIS — M542 Cervicalgia: Secondary | ICD-10-CM | POA: Diagnosis not present

## 2018-04-16 DIAGNOSIS — Z135 Encounter for screening for eye and ear disorders: Secondary | ICD-10-CM | POA: Diagnosis not present

## 2018-04-16 DIAGNOSIS — Z77018 Contact with and (suspected) exposure to other hazardous metals: Secondary | ICD-10-CM

## 2018-04-16 DIAGNOSIS — M5412 Radiculopathy, cervical region: Secondary | ICD-10-CM

## 2018-05-10 DIAGNOSIS — E8881 Metabolic syndrome: Secondary | ICD-10-CM | POA: Diagnosis not present

## 2018-05-10 DIAGNOSIS — Z6841 Body Mass Index (BMI) 40.0 and over, adult: Secondary | ICD-10-CM | POA: Diagnosis not present

## 2018-05-10 DIAGNOSIS — Z713 Dietary counseling and surveillance: Secondary | ICD-10-CM | POA: Diagnosis not present

## 2018-06-04 DIAGNOSIS — J4520 Mild intermittent asthma, uncomplicated: Secondary | ICD-10-CM | POA: Diagnosis not present

## 2018-06-04 DIAGNOSIS — M2550 Pain in unspecified joint: Secondary | ICD-10-CM | POA: Diagnosis not present

## 2018-06-04 DIAGNOSIS — Z23 Encounter for immunization: Secondary | ICD-10-CM | POA: Diagnosis not present

## 2018-06-04 DIAGNOSIS — E049 Nontoxic goiter, unspecified: Secondary | ICD-10-CM | POA: Diagnosis not present

## 2018-06-04 DIAGNOSIS — I10 Essential (primary) hypertension: Secondary | ICD-10-CM | POA: Diagnosis not present

## 2018-06-04 DIAGNOSIS — E785 Hyperlipidemia, unspecified: Secondary | ICD-10-CM | POA: Diagnosis not present

## 2018-06-04 DIAGNOSIS — Z79899 Other long term (current) drug therapy: Secondary | ICD-10-CM | POA: Diagnosis not present

## 2018-06-04 DIAGNOSIS — Z0000 Encounter for general adult medical examination without abnormal findings: Secondary | ICD-10-CM | POA: Diagnosis not present

## 2018-06-04 DIAGNOSIS — Z1211 Encounter for screening for malignant neoplasm of colon: Secondary | ICD-10-CM | POA: Diagnosis not present

## 2018-06-04 DIAGNOSIS — Z6841 Body Mass Index (BMI) 40.0 and over, adult: Secondary | ICD-10-CM | POA: Diagnosis not present

## 2018-06-04 DIAGNOSIS — K219 Gastro-esophageal reflux disease without esophagitis: Secondary | ICD-10-CM | POA: Diagnosis not present

## 2018-06-05 ENCOUNTER — Other Ambulatory Visit: Payer: Self-pay | Admitting: Family Medicine

## 2018-06-05 DIAGNOSIS — Z1239 Encounter for other screening for malignant neoplasm of breast: Secondary | ICD-10-CM

## 2018-06-08 DIAGNOSIS — Z1211 Encounter for screening for malignant neoplasm of colon: Secondary | ICD-10-CM | POA: Diagnosis not present

## 2018-06-25 DIAGNOSIS — H35371 Puckering of macula, right eye: Secondary | ICD-10-CM | POA: Diagnosis not present

## 2018-06-25 DIAGNOSIS — H524 Presbyopia: Secondary | ICD-10-CM | POA: Diagnosis not present

## 2018-06-25 DIAGNOSIS — H2513 Age-related nuclear cataract, bilateral: Secondary | ICD-10-CM | POA: Diagnosis not present

## 2018-07-09 DIAGNOSIS — R195 Other fecal abnormalities: Secondary | ICD-10-CM | POA: Diagnosis not present

## 2018-07-09 DIAGNOSIS — Z6841 Body Mass Index (BMI) 40.0 and over, adult: Secondary | ICD-10-CM | POA: Diagnosis not present

## 2018-08-01 ENCOUNTER — Ambulatory Visit
Admission: RE | Admit: 2018-08-01 | Discharge: 2018-08-01 | Disposition: A | Discharge status: Home / Self Care | Payer: PPO | Source: Ambulatory Visit | Attending: Family Medicine | Admitting: Family Medicine

## 2018-08-01 DIAGNOSIS — Z1239 Encounter for other screening for malignant neoplasm of breast: Secondary | ICD-10-CM

## 2018-08-01 DIAGNOSIS — Z1231 Encounter for screening mammogram for malignant neoplasm of breast: Secondary | ICD-10-CM | POA: Diagnosis not present

## 2018-09-13 DIAGNOSIS — E785 Hyperlipidemia, unspecified: Secondary | ICD-10-CM | POA: Diagnosis not present

## 2018-09-13 DIAGNOSIS — J4520 Mild intermittent asthma, uncomplicated: Secondary | ICD-10-CM | POA: Diagnosis not present

## 2018-09-13 DIAGNOSIS — E559 Vitamin D deficiency, unspecified: Secondary | ICD-10-CM | POA: Diagnosis not present

## 2018-09-13 DIAGNOSIS — R5383 Other fatigue: Secondary | ICD-10-CM | POA: Diagnosis not present

## 2018-09-13 DIAGNOSIS — I10 Essential (primary) hypertension: Secondary | ICD-10-CM | POA: Diagnosis not present

## 2018-09-13 DIAGNOSIS — K219 Gastro-esophageal reflux disease without esophagitis: Secondary | ICD-10-CM | POA: Diagnosis not present

## 2018-09-14 DIAGNOSIS — R5383 Other fatigue: Secondary | ICD-10-CM | POA: Diagnosis not present

## 2018-09-14 DIAGNOSIS — E785 Hyperlipidemia, unspecified: Secondary | ICD-10-CM | POA: Diagnosis not present

## 2018-09-14 DIAGNOSIS — J4520 Mild intermittent asthma, uncomplicated: Secondary | ICD-10-CM | POA: Diagnosis not present

## 2018-09-14 DIAGNOSIS — E559 Vitamin D deficiency, unspecified: Secondary | ICD-10-CM | POA: Diagnosis not present

## 2018-09-14 DIAGNOSIS — I10 Essential (primary) hypertension: Secondary | ICD-10-CM | POA: Diagnosis not present

## 2018-09-14 DIAGNOSIS — K219 Gastro-esophageal reflux disease without esophagitis: Secondary | ICD-10-CM | POA: Diagnosis not present

## 2018-10-12 DIAGNOSIS — R009 Unspecified abnormalities of heart beat: Secondary | ICD-10-CM | POA: Diagnosis not present

## 2018-10-18 NOTE — Progress Notes (Signed)
Cardiology Office Note:    Date:  10/19/2018   ID:  [name], DOB May 22, 1946, MRN [id number]  PCP:  Kathyrn Lass, MD  Cardiologist:  Shirlee More, MD   Referring MD: Otis Brace, MD  ASSESSMENT:    1. Abnormal EKG   2. Chaotic atrial rhythm   3. Palpitations    PLAN:    In order of problems listed above:  1. Her EKG today shows sinus rhythm in the monitor strip from the day of colonoscopy I would best described as a chaotic atrial rhythm with multiple P wave morphologies but not tachycardic and certainly does not meet criteria for atrial fibrillation.  She thinks it was precipitated by the bowel prep and she may well be correct further evaluation 2-week ambulatory heart rhythm monitor reassess in the office in 6 weeks at this time would not put her on antiarrhythmic drug or anticoagulant and I do not think she requires an echocardiogram thyroid studies this week were normal.  I also encouraged her to buy an adapter for the iPhone and to screen her heart rhythm at home for atrial fibrillation 2. EKG strip to my eye is chaotic atrial rhythm would not anticoagulate her start antiarrhythmic drugs and often this is a response to other issues and may well been precipitated with her bowel prep her thyroid and potassium are normal.  She takes no proarrhythmic drugs. 3. Since this episode she is a little bit aware of her heart and says at times she has palpitation will utilize a 2-week ambulatory heart rhythm monitor 4. Encourage her to buy an iPhone adapter that she can report symptomatic episodes at home  Next appointment in 6 weeks   Medication Adjustments/Labs and Tests Ordered: Current medicines are reviewed at length with the patient today.  Concerns regarding medicines are outlined above.  Orders Placed This Encounter  Procedures  . LONG TERM MONITOR (3-14 DAYS)  . EKG 12-Lead   No orders of the defined types were placed in this encounter.    Chief Complaint  Patient  presents with  . Atrial Fibrillation    History of Present Illness:    [name] is a 73 y.o. female who is being seen today for the evaluation of atrial fibrillation at the request of Brahmbhatt, Orson Gear, MD.  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  She had a fecal blood test that was abnormal and was scheduled for routine colonoscopy she presented in a monitor strip showed a heart rhythm that was a concern for atrial fibrillation procedure canceled was seen in her PCPs office where they told her they were not sure what rhythm she was in but did not think it is atrial fibrillation and is referred to me I agree with their assessment and I think the best description of hers was sinus rhythm with frequent APCs and I think it meets criteria for what we call chaotic atrial rhythm.  She has no underlying history of heart disease and several decades ago was seen by cardiologist in [place] had an echocardiogram and was told she had an innocent murmur.  No chest pain exercise intolerance edema orthopnea syncope or TIA but since that day every once a while she is aware of her heart beating irregularly not severe not frequent and not sustained.  She takes no over-the-counter proarrhythmic drugs.  She tells me she is not scheduled for colonoscopy in the next month.  For further evaluation will utilize a 2-week ambulatory heart rhythm  monitor to assess her atrial arrhythmia if she is having episodes of atrial tachycardia she would benefit from beta-blocker if we document atrial fibrillation we need to consider issues like anticoagulation as she is female and over the age of 65 and/or antiarrhythmic drug.  She has had no obvious rectal bleeding Past Medical History:  Diagnosis Date  . Heart murmur    --innocent  . Hypertension     Past Surgical History:  Procedure Laterality Date  . ABDOMINAL HYSTERECTOMY  03/2010   TAH/BSO  . CESAREAN SECTION  1981  . 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 ROTATOR CUFF REPAIR Left 2010  . TOTAL KNEE ARTHROPLASTY Bilateral     Current Medications: Current Meds  Medication Sig  . albuterol (PROAIR HFA) 108 (90 Base) MCG/ACT inhaler ProAir HFA 90 mcg/actuation aerosol inhaler  INHALE 1 TO 2 PUFFS EVERY 4 TO 6 HOURS AS NEEDED 30 DAYS  . losartan-hydrochlorothiazide (HYZAAR) 50-12.5 MG tablet Take 1 tablet by mouth daily.  . Vitamin D, Ergocalciferol, (DRISDOL) 1.25 MG (50000 UT) CAPS capsule TAKE 1 CAPSULE BY MOUTH 2 TIMES A WEEK     Allergies:   Codeine and Sulfa antibiotics   Social History   Socioeconomic History  . Marital status: Divorced    Spouse name: Not on file  . Number of children: Not on file  . Years of education: Not on file  . Highest education level: Not on file  Occupational History  . Not on file  Social Needs  . Financial resource strain: Not on file  . Food insecurity:    Worry: Not on file    Inability: Not on file  . Transportation needs:    Medical: Not on file    Non-medical: Not on file  Tobacco Use  . Smoking status: Never Smoker  . Smokeless tobacco: Never Used  Substance and Sexual Activity  . Alcohol use: Yes    Comment: occ wine  . Drug use: Never  . Sexual activity: Never    Partners: Male    Birth control/protection: Surgical    Comment: TAH/BSO  Lifestyle  . Physical activity:    Days per week: Not on file    Minutes per session: Not on file  . Stress: Not on file  Relationships  . Social connections:    Talks on phone: Not on file    Gets together: Not on file    Attends religious service: Not on file    Active member of club or organization: Not on file    Attends meetings of clubs or organizations: Not on file    Relationship status: Not on file  Other Topics Concern  . Not on file  Social History Narrative  . Not on file     Family History: The patient's family history includes Breast cancer in her mother, paternal aunt, and  paternal aunt; Heart attack in her maternal grandfather; Stroke in her paternal aunt and another family member.  ROS:   Review of Systems  Constitution: Negative.  HENT: Negative.   Eyes: Negative.   Cardiovascular: Positive for palpitations.  Respiratory: Negative.   Endocrine: Negative.   Hematologic/Lymphatic: Negative.   Skin: Negative.   Musculoskeletal: Negative.   Gastrointestinal: Negative.   Genitourinary: Negative.   Neurological: Negative.   Psychiatric/Behavioral: Negative.   Allergic/Immunologic: Negative.    Please see the history of present illness.     All other systems reviewed and are negative.  EKGs/Labs/Other Studies  Reviewed:    The following studies were reviewed today:   EKG:  EKG is  ordered today.  The ekg ordered today demonstrates sinus rhythm and is normal  Recent Labs: Recent CBC renal function and thyroid are normal reviewed through [facility]   Physical Exam:    VS:  BP 140/90 (BP Location: Left Arm, Patient Position: Sitting, Cuff Size: Large)   Pulse 77   Ht 5\' 2"  (1.575 m)   Wt 229 lb 1.9 oz (103.9 kg)   SpO2 95%   BMI 41.91 kg/m     Wt Readings from Last 3 Encounters:  10/19/18 229 lb 1.9 oz (103.9 kg)  09/23/15 249 lb (112.9 kg)  11/07/13 248 lb (112.5 kg)     GEN:  Well nourished, well developed in no acute distress HEENT: Normal NECK: No JVD; No carotid bruits LYMPHATICS: No lymphadenopathy CARDIAC: RRR, no murmurs, rubs, gallops RESPIRATORY:  Clear to auscultation without rales, wheezing or rhonchi  ABDOMEN: Soft, non-tender, non-distended MUSCULOSKELETAL:  No edema; No deformity  SKIN: Warm and dry NEUROLOGIC:  Alert and oriented x 3 PSYCHIATRIC:  Normal affect     Signed, Shirlee More, MD  10/19/2018 10:06 AM    [place]

## 2018-10-19 ENCOUNTER — Ambulatory Visit (INDEPENDENT_AMBULATORY_CARE_PROVIDER_SITE_OTHER): Payer: PPO | Admitting: Cardiology

## 2018-10-19 ENCOUNTER — Encounter: Payer: Self-pay | Admitting: Cardiology

## 2018-10-19 VITALS — BP 140/90 | HR 77 | Ht 62.0 in | Wt 229.1 lb

## 2018-10-19 DIAGNOSIS — R9431 Abnormal electrocardiogram [ECG] [EKG]: Secondary | ICD-10-CM

## 2018-10-19 DIAGNOSIS — R002 Palpitations: Secondary | ICD-10-CM | POA: Diagnosis not present

## 2018-10-19 DIAGNOSIS — I498 Other specified cardiac arrhythmias: Secondary | ICD-10-CM | POA: Insufficient documentation

## 2018-10-19 HISTORY — DX: Other specified cardiac arrhythmias: I49.8

## 2018-10-19 NOTE — Patient Instructions (Addendum)
Medication Instructions:  Your physician recommends that you continue on your current medications as directed. Please refer to the Current Medication list given to you today.  If you need a refill on your cardiac medications before your next appointment, please call your pharmacy.   Lab work: NONE  If you have labs (blood work) drawn today and your tests are completely normal, you will receive your results only by: Marland Kitchen MyChart Message (if you have MyChart) OR . A paper copy in the mail If you have any lab test that is abnormal or we need to change your treatment, we will call you to review the results.  Testing/Procedures: An EKG was performed today.  You will schedule appointment for placement of Zio monitor at check out desk.  Follow-Up: At [facility], you and your health needs are our priority.  As part of our continuing mission to provide you with exceptional heart care, we have created designated Provider Care Teams.  These Care Teams include your primary Cardiologist (physician) and Advanced Practice Providers (APPs -  Physician Assistants and Nurse Practitioners) who all work together to provide you with the care you need, when you need it. You will need a follow up appointment in 6 weeks.     Any Other Special Instructions Will Be Listed Below (If Applicable). Your physician has recommended that you wear a ZIO monitor. ZIO monitors are medical devices that record the heart's electrical activity. Doctors most often use these monitors to diagnose arrhythmias. Arrhythmias are problems with the speed or rhythm of the heartbeat. The monitor is a small, portable device. You can wear one while you do your normal daily activities. This is usually used to diagnose what is causing palpitations/syncope (passing out).      KardiaMobile [email or web address]        FDA-cleared, clinical grade mobile EKG monitor: Jodelle Red is the most clinically-validated  mobile EKG used by the world's leading cardiac care medical professionals With Basic service, know instantly if your heart rhythm is normal or if atrial fibrillation is detected, and email the last single EKG recording to yourself or your doctor Premium service, available for purchase through the Kardia app for $9.99 per month or $99 per year, includes unlimited history and storage of your EKG recordings, a monthly EKG summary report to share with your doctor, along with the ability to track your blood pressure, activity and weight Includes one KardiaMobile phone clip FREE SHIPPING: Standard delivery 1-3 business days. Orders placed by 11:00am PST will ship that afternoon. Otherwise, will ship next business day. All orders ship via [employer] from [place], [place]

## 2018-10-22 ENCOUNTER — Ambulatory Visit (INDEPENDENT_AMBULATORY_CARE_PROVIDER_SITE_OTHER): Payer: PPO

## 2018-10-22 DIAGNOSIS — R002 Palpitations: Secondary | ICD-10-CM

## 2018-10-25 ENCOUNTER — Ambulatory Visit: Payer: PPO | Admitting: Cardiology

## 2018-11-15 ENCOUNTER — Telehealth: Payer: Self-pay | Admitting: *Deleted

## 2018-11-15 MED ORDER — METOPROLOL TARTRATE 50 MG PO TABS: 50.0000 mg | ORAL_TABLET | Freq: Two times a day (BID) | ORAL | 1 refills | Status: DC

## 2018-11-15 NOTE — Telephone Encounter (Signed)
-----   Message from Richardo Priest, MD sent at 11/13/2018  5:58 PM EDT ----- Her event monitor shows a high frequency of supraventricular arrhythmia frequent episodes of atrial tachycardia and episodes of atrial fibrillation with rapid rates of greater than 180 bpm.  If it was not for her anemia and Hemoccult-positive stool not anticoagulated unlike her started beta-blocker metoprolol Lopressor 50 mg twice daily and when I have office hours in [facility]

## 2018-11-15 NOTE — Telephone Encounter (Signed)
Patient informed of monitor results and advised that Dr. Bettina Gavia recommends her start metoprolol tartrate 50 mg twice daily. Patient is agreeable and verbalized understanding. Prescription sent to [facility] in [place] as requested. Reminded her of follow up appointment on 12/10/2018 in the [facility] office at 10:20 am when results will be discussed in greater detail. No further questions.

## 2018-12-03 ENCOUNTER — Telehealth: Payer: Self-pay | Admitting: Cardiology

## 2018-12-03 NOTE — Telephone Encounter (Signed)
Patient contacted by phone. She is very concerned as her heart has continued to race since wearing the monitor.  She was calling to inquire about the appointment she is scheduled for on 12-10-2018.  Patient has not been taking metoprolol as directed after results of monitor were reviewed by Dr Bettina Gavia.  She was only taking it once daily.   Patient is concerned if she is having atrial fibrillation why she has not been given an anticoagulation.  Explained to patient per Dr Joya Gaskins verbal order that she should not be taking any anticoagulation due to positive hemocult stool test and she has not had colonoscopy.  Patient advised that being on an anticoagulation would put her at a higher risk for GI bleed.   Patient advised to purchase Kardia app to monitor heart rate from home.  She was instructed to take metoprolol tartrate 50mg  one tablet twice daily as previously instructed by Dr Bettina Gavia.  Patient will have televisit with Dr Bettina Gavia as planned on 12-10-2018 to discuss in more detail.   Patient understands that Dr Bettina Gavia would not be providing a note at this time stating that she is unable to work.   Patient agrees to plan and verbalized understanding.

## 2018-12-03 NOTE — Telephone Encounter (Signed)
I understand her anxiety as we are all concerned at this time but I do not think I can say that she is unable to work.

## 2018-12-03 NOTE — Telephone Encounter (Signed)
Patient called asking if she should stay out of work for the next 30 days until this calms down. 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 Please advise.

## 2018-12-06 ENCOUNTER — Telehealth: Payer: Self-pay | Admitting: Cardiology

## 2018-12-06 DIAGNOSIS — R195 Other fecal abnormalities: Secondary | ICD-10-CM | POA: Insufficient documentation

## 2018-12-06 HISTORY — DX: Other fecal abnormalities: R19.5

## 2018-12-06 NOTE — Telephone Encounter (Signed)
Called to get additional information on cardiac screening questions below.   Patient reports gaining 10 pounds in the past year. She has occasional shortness of breath on exertion mainly when walking up stairs. Patient states that her ankle were slightly swollen a couple of days ago after drinking multiple diet sodas. This edema has resolved.   Patient will keep follow up televisit as scheduled on Monday, 12/10/2018, at 10:40 am with Dr. Bettina Gavia. She will weigh herself that morning and have recent BP and HR readings available in addition to having her medications readily available to review. No further questions.

## 2018-12-06 NOTE — Telephone Encounter (Signed)
Cardiac Questionnaire:    Since your last visit or hospitalization:    1. Have you been having new or worsening chest pain? no   2. Have you been having new or worsening shortness of breath? some on exertion 3. Have you been having new or worsening leg swelling, wt gain, or increase in abdominal girth (pants fitting more tightly)? Wt gain and swelling ankles   4. Have you had any passing out spells? no    *A YES to any of these questions would result in the appointment being kept. *If all the answers to these questions are NO, we should indicate that given the current situation regarding the worldwide coronarvirus pandemic, at the recommendation of the CDC, we are looking to limit gatherings in our waiting area, and thus will reschedule their appointment beyond four weeks from today.   _____________   [id number] Pre-Screening Questions:   Do you currently have a fever?no  Have you recently travelled on a cruise, internationally, or to [place], [place], [place], [place], or [place], [place]) ? no  Have you been in contact with someone that is currently pending confirmation of Covid19 testing or has been confirmed to have the [place] virus?  No Are you currently experiencing fatigue or cough? No  YOUR CARDIOLOGY TEAM HAS ARRANGED FOR AN E-VISIT FOR YOUR APPOINTMENT - PLEASE REVIEW IMPORTANT INFORMATION BELOW SEVERAL DAYS PRIOR TO YOUR APPOINTMENT  Due to the recent COVID-19 pandemic, we are transitioning in-person office visits to tele-medicine visits in an effort to decrease unnecessary exposure to our patients and staff. Medicare and most insurances are covering these visits without a copay needed. We also encourage you to sign up for MyChart if you have not already done so. You will need a smartphone if possible. For patients that do not have this, we can still complete the visit using a regular telephone but do prefer a smartphone to enable video when possible. You may have a close family  member that lives with you that can help. If possible, we also ask that you have a blood pressure cuff and scale at home to measure your blood pressure, heart rate and weight prior to your scheduled appointment. Patients with clinical needs that need an in-person evaluation and testing will still be able to come to the office if absolutely necessary. If you have any questions, feel free to call our office.  CONSENT FOR TELE-HEALTH VISIT - PLEASE REVIEW  I hereby voluntarily request, consent and authorize [facility] and its employed or contracted physicians, physician assistants, nurse practitioners or other licensed health care professionals (the Practitioner), to provide me with telemedicine health care services (the Services") as deemed necessary by the treating Practitioner. I acknowledge and consent to receive the Services by the Practitioner via telemedicine. I understand that the telemedicine visit will involve communicating with the Practitioner through live audiovisual communication technology and the disclosure of certain medical information by electronic transmission. I acknowledge that I have been given the opportunity to request an in-person assessment or other available alternative prior to the telemedicine visit and am voluntarily participating in the telemedicine visit.  I understand that I have the right to withhold or withdraw my consent to the use of telemedicine in the course of my care at any time, without affecting my right to future care or treatment, and that the Practitioner or I may terminate the telemedicine visit at any time. I understand that I have the right to inspect all information obtained and/or recorded  in the course of the telemedicine visit and may receive copies of available information for a reasonable fee.  I understand that some of the potential risks of receiving the Services via telemedicine include:   Delay or interruption in medical evaluation due to  technological equipment failure or disruption;  Information transmitted may not be sufficient (e.g. poor resolution of images) to allow for appropriate medical decision making by the Practitioner; and/or   In rare instances, security protocols could fail, causing a breach of personal health information.  Furthermore, I acknowledge that it is my responsibility to provide information about my medical history, conditions and care that is complete and accurate to the best of my ability. I acknowledge that Practitioner's advice, recommendations, and/or decision may be based on factors not within their control, such as incomplete or inaccurate data provided by me or distortions of diagnostic images or specimens that may result from electronic transmissions. I understand that the practice of medicine is not an exact science and that Practitioner makes no warranties or guarantees regarding treatment outcomes. I acknowledge that I will receive a copy of this consent concurrently upon execution via email to the email address I last provided but may also request a printed copy by calling the office of [facility].    I understand that my insurance will be billed for this visit.   I have read or had this consent read to me.  I understand the contents of this consent, which adequately explains the benefits and risks of the Services being provided via telemedicine.   I have been provided ample opportunity to ask questions regarding this consent and the Services and have had my questions answered to my satisfaction.  I give my informed consent for the services to be provided through the use of telemedicine in my medical care  By participating in this telemedicine visit I agree to the above.  Patient consent to televisit 12/06/2018/pp

## 2018-12-09 ENCOUNTER — Other Ambulatory Visit: Payer: Self-pay | Admitting: Cardiology

## 2018-12-10 ENCOUNTER — Telehealth (INDEPENDENT_AMBULATORY_CARE_PROVIDER_SITE_OTHER): Payer: PPO | Admitting: Cardiology

## 2018-12-10 ENCOUNTER — Encounter: Payer: Self-pay | Admitting: *Deleted

## 2018-12-10 ENCOUNTER — Encounter: Payer: Self-pay | Admitting: Cardiology

## 2018-12-10 VITALS — BP 138/68 | HR 46 | Ht 62.0 in | Wt 236.0 lb

## 2018-12-10 DIAGNOSIS — R195 Other fecal abnormalities: Secondary | ICD-10-CM | POA: Diagnosis not present

## 2018-12-10 DIAGNOSIS — I119 Hypertensive heart disease without heart failure: Secondary | ICD-10-CM | POA: Insufficient documentation

## 2018-12-10 DIAGNOSIS — I480 Paroxysmal atrial fibrillation: Secondary | ICD-10-CM

## 2018-12-10 HISTORY — DX: Paroxysmal atrial fibrillation: I48.0

## 2018-12-10 HISTORY — DX: Hypertensive heart disease without heart failure: I11.9

## 2018-12-10 MED ORDER — APIXABAN 5 MG PO TABS: 5.0000 mg | ORAL_TABLET | Freq: Two times a day (BID) | ORAL | 3 refills | Status: DC

## 2018-12-10 NOTE — Progress Notes (Signed)
Virtual Visit via Telephone Note   This visit type was conducted due to national recommendations for restrictions regarding the COVID-19 Pandemic (e.g. social distancing) in an effort to limit this patient's exposure and mitigate transmission in our community.  Due to her co-morbid illnesses, this patient is at least at moderate risk for complications without adequate follow up.  This format is felt to be most appropriate for this patient at this time.  The patient did not have access to video technology/had technical difficulties with video requiring transitioning to audio format only (telephone).  All issues noted in this document were discussed and addressed.  No physical exam could be performed with this format.  Please refer to the patient's chart for her  consent to telehealth for [facility].   Evaluation Performed:  Follow-up visit  Date:  12/10/2018   ID:  Erika VIRUET, DOB July 18, 1946, MRN [id number]  Patient Location: Home  Provider Location: Home  PCP:  Kathyrn Lass, MD  Cardiologist:  No primary care provider on file. Dr Kathyrn Lass Electrophysiologist:  None   Chief Complaint:  PAF  History of Present Illness:    [name] is a 73 y.o. female who presents via audio/video conferencing for a telehealth visit today.    She has a history of chaotic atrial rhythm captured at the time of a GI evaluation for stool hematest positive last seen 10/19/18. She utilize an extended ambulatory heart rhythm monitor which showed frequent supraventricular ectopy with frequent episodes of SVT as well as atrial fibrillation.  The atrial fibrillation was associated with a rapid response and she was started on a beta blocker.  Her stroke risk is moderate female sex age over 65 and history of hypertension The patient does not have symptoms concerning for COVID-19 infection (fever, chills, cough, or new shortness of breath).   There were several goals for this visit the first was to  review the results of her event monitor which indeed does show paroxysmal atrial fibrillation with a rapid response.  I placed her on a beta-blocker she has had a nice response and has had no clinical recurrence and we have deferred anticoagulation waiting colonoscopy.  It is obvious in this COVID-19 environment that is not can occur for the next weeks to months she has had no clinical bleeding her CBC is normal she had hematest positive stool as a screen and she is at moderate stroke risk with female sex age and hypertension.  After review of the benefits options and risk she made an informed decision to stop aspirin start Eliquis and will do a CBC at her medical office in 2 weeks looking for any change in hemoglobin hematocrit and should be built vigilant for clinical bleeding.  I do not think she needs to start an antiarrhythmic drug and I again asked her to purchase the iPhone adapter to monitor heart rhythm.  The second issue is her cardiovascular risk with COVID-19 she is in a private office she does not have social distancing in our office is not isolated from the staff.  She is at increased cardiovascular risk with age hypertension and heart disease and I have given her a note that if she feels it is necessary she can withdraw from work.  She has had no fever cough or shortness of breath.  No edema orthopnea palpitations syncope or TIA and actually feels quite well on her beta-blocker.  She has never had clinical bleeding GI or GU  Past Medical History:  Diagnosis Date  . Heart murmur    --innocent  . Hypertension    Past Surgical History:  Procedure Laterality Date  . ABDOMINAL HYSTERECTOMY  03/2010   TAH/BSO  . CESAREAN SECTION  1981  . 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 ROTATOR CUFF REPAIR Left 2010  . TOTAL KNEE ARTHROPLASTY Bilateral      No outpatient medications have been marked as taking for the 12/10/18 encounter (Appointment) with  Richardo Priest, MD.     Allergies:   Codeine and Sulfa antibiotics   Social History   Tobacco Use  . Smoking status: Never Smoker  . Smokeless tobacco: Never Used  Substance Use Topics  . Alcohol use: Yes    Comment: occ wine  . Drug use: Never     Family Hx: The patient's family history includes Breast cancer in her mother, paternal aunt, and paternal aunt; Heart attack in her maternal grandfather; Stroke in her paternal aunt and another family member.  ROS:   Please see the history of present illness.     All other systems reviewed and are negative.   Prior CV studies:   The following studies were reviewed today:  We reviewed the results of her ambulatory event monitor together as well as her CBC in February  Labs/Other Tests and Data Reviewed:    Labs with her PCP 10/12/2018 showed normal CBC and TSH Recent Labs: No results found for requested labs within last 8760 hours.   Recent Lipid Panel No results found for: CHOL, TRIG, HDL, CHOLHDL, LDLCALC, LDLDIRECT  Wt Readings from Last 3 Encounters:  10/19/18 229 lb 1.9 oz (103.9 kg)  09/23/15 249 lb (112.9 kg)  11/07/13 248 lb (112.5 kg)     Objective:    Vital Signs:  There were no vitals taken for this visit.   Well nourished, well developed female in no acute distress.   ASSESSMENT & PLAN:    1. Paroxysmal atrial fibrillation improved she has had good response to beta-blocker and we addressed this issue of anticoagulation.  Unfortunately in her current COVID-19 environment it could be weeks to months before colonoscopy is done her stroke risk is moderate she has good healthcare literacy we can access a hemoglobin as an outpatient in 2 weeks and we will stop aspirin and put her on Eliquis as outlined.  I do not think she requires an antiarrhythmic drug. 2. Hypertensive heart disease stable continue current antihypertensive beta-blocker ARB diuretic and when I see her in the office in July she will need to have  an echocardiogram done as we documented atrial fibrillation 3. 3.  Fecal occult blood test positive she will have elective colonoscopy in the future  COVID-19 Education: The signs and symptoms of COVID-19 were discussed with the patient and how to seek care for testing (follow up with PCP or arrange E-visit).  The importance of social distancing was discussed today.  Time:   Today, I have spent 27 minutes with the patient with telehealth technology discussing the above problems.     Medication Adjustments/Labs and Tests Ordered: Current medicines are reviewed at length with the patient today.  Concerns regarding medicines are outlined above.  Tests Ordered: No orders of the defined types were placed in this encounter.  Medication Changes: No orders of the defined types were placed in this encounter.   Disposition:  Follow up July office appt  Signed, Shirlee More, MD  12/10/2018 9:43 AM  [facility]

## 2018-12-10 NOTE — Patient Instructions (Addendum)
Medication Instructions:  Your physician has recommended you make the following change in your medication:   STOP aspirin  START apixaban (eliquis) 5 mg: Take 1 tablet twice daily  If you need a refill on your cardiac medications before your next appointment, please call your pharmacy.   Lab work: Your physician recommends that you return for lab work in 2 weeks: CBC. Please go to our [facility] office or the nearest [facility] for labs, no appointment needed. If you have these labs done at your workplace, please fax the results to our office for Dr. Bettina Gavia to review at [phone].   If you have labs (blood work) drawn today and your tests are completely normal, you will receive your results only by: Marland Kitchen MyChart Message (if you have MyChart) OR . A paper copy in the mail If you have any lab test that is abnormal or we need to change your treatment, we will call you to review the results.  Testing/Procedures: None  Follow-Up: At [facility], you and your health needs are our priority.  As part of our continuing mission to provide you with exceptional heart care, we have created designated Provider Care Teams.  These Care Teams include your primary Cardiologist (physician) and Advanced Practice Providers (APPs -  Physician Assistants and Nurse Practitioners) who all work together to provide you with the care you need, when you need it. You will need a follow up appointment in 3 months: Monday, 03/25/2019, at 11:00 am in the [facility] office.      Apixaban oral tablets What is this medicine? APIXABAN (a PIX a ban) is an anticoagulant (blood thinner). It is used to lower the chance of stroke in people with a medical condition called atrial fibrillation. It is also used to treat or prevent blood clots in the lungs or in the veins. This medicine may be used for other purposes; ask your health care provider or pharmacist if you have questions. COMMON BRAND NAME(S): Eliquis What should I  tell my health care provider before I take this medicine? They need to know if you have any of these conditions: -bleeding disorders -bleeding in the brain -blood in your stools (black or tarry stools) or if you have blood in your vomit -history of stomach bleeding -kidney disease -liver disease -mechanical heart valve -an unusual or allergic reaction to apixaban, other medicines, foods, dyes, or preservatives -pregnant or trying to get pregnant -breast-feeding How should I use this medicine? Take this medicine by mouth with a glass of water. Follow the directions on the prescription label. You can take it with or without food. If it upsets your stomach, take it with food. Take your medicine at regular intervals. Do not take it more often than directed. Do not stop taking except on your doctor's advice. Stopping this medicine may increase your risk of a blood clot. Be sure to refill your prescription before you run out of medicine. Talk to your pediatrician regarding the use of this medicine in children. Special care may be needed. Overdosage: If you think you have taken too much of this medicine contact a poison control center or emergency room at once. NOTE: This medicine is only for you. Do not share this medicine with others. What if I miss a dose? If you miss a dose, take it as soon as you can. If it is almost time for your next dose, take only that dose. Do not take double or extra doses. What may interact with  this medicine? This medicine may interact with the following: -aspirin and aspirin-like medicines -certain medicines for fungal infections like ketoconazole and itraconazole -certain medicines for seizures like carbamazepine and phenytoin -certain medicines that treat or prevent blood clots like warfarin, enoxaparin, and dalteparin -clarithromycin -NSAIDs, medicines for pain and inflammation, like ibuprofen or naproxen -rifampin -ritonavir -St. John's wort This list may not  describe all possible interactions. Give your health care provider a list of all the medicines, herbs, non-prescription drugs, or dietary supplements you use. Also tell them if you smoke, drink alcohol, or use illegal drugs. Some items may interact with your medicine. What should I watch for while using this medicine? Visit your healthcare professional for regular checks on your progress. You may need blood work done while you are taking this medicine. Your condition will be monitored carefully while you are receiving this medicine. It is important not to miss any appointments. Avoid sports and activities that might cause injury while you are using this medicine. Severe falls or injuries can cause unseen bleeding. Be careful when using sharp tools or knives. Consider using an Copy. Take special care brushing or flossing your teeth. Report any injuries, bruising, or red spots on the skin to your healthcare professional. If you are going to need surgery or other procedure, tell your healthcare professional that you are taking this medicine. Wear a medical ID bracelet or chain. Carry a card that describes your disease and details of your medicine and dosage times. What side effects may I notice from receiving this medicine? Side effects that you should report to your doctor or health care professional as soon as possible: -allergic reactions like skin rash, itching or hives, swelling of the face, lips, or tongue -signs and symptoms of bleeding such as bloody or black, tarry stools; red or dark-brown urine; spitting up blood or brown material that looks like coffee grounds; red spots on the skin; unusual bruising or bleeding from the eye, gums, or nose -signs and symptoms of a blood clot such as chest pain; shortness of breath; pain, swelling, or warmth in the leg -signs and symptoms of a stroke such as changes in vision; confusion; trouble speaking or understanding; severe headaches; sudden  numbness or weakness of the face, arm or leg; trouble walking; dizziness; loss of coordination This list may not describe all possible side effects. Call your doctor for medical advice about side effects. You may report side effects to FDA at 1-800-FDA-1088. Where should I keep my medicine? Keep out of the reach of children. Store at room temperature between 20 and 25 degrees C (68 and 77 degrees F). Throw away any unused medicine after the expiration date. NOTE: This sheet is a summary. It may not cover all possible information. If you have questions about this medicine, talk to your doctor, pharmacist, or health care provider.  2019 Elsevier/Gold Standard (2017-08-17 11:20:07)

## 2018-12-11 ENCOUNTER — Telehealth: Payer: Self-pay | Admitting: Cardiology

## 2018-12-11 NOTE — Telephone Encounter (Signed)
Please fax out of work letter to [employer], [employer] @ [phone]

## 2018-12-11 NOTE — Telephone Encounter (Signed)
[name], at the front desk in the [employer], has faxed work letter as requested.

## 2018-12-25 ENCOUNTER — Encounter: Payer: Self-pay | Admitting: Cardiology

## 2018-12-26 ENCOUNTER — Telehealth: Payer: Self-pay

## 2018-12-26 NOTE — Telephone Encounter (Signed)
° °  Patient requesting call regarding being out of work

## 2018-12-26 NOTE — Telephone Encounter (Signed)
Attempted to return patient's call, no answer, left voicemail

## 2019-01-18 ENCOUNTER — Telehealth: Payer: Self-pay | Admitting: Cardiology

## 2019-01-18 NOTE — Telephone Encounter (Signed)
  Patient needs to know that she is okay to go back to work. She was not sure how long [employer] took her out for

## 2019-01-18 NOTE — Telephone Encounter (Signed)
Phoned patient who states Dr. Bettina Gavia recently wrote a note for her to be off work due to cardiac disease and high risk exposure to COVID on the job. States she has been denied short term disabiltiy and is trying to apply for COVID unemployment but has been unsuccessful.    She's planning to contest the STD denial and would like to get another note from Dr. Bettina Gavia if possible. She has been trying to call Disability to determine why she's being denied,  then is hoping to get a revised note from Dr. Bettina Gavia if possible.      Patient has been on hold for disability office for several hours this morning and hasn't been able to speak to anyone.     She will follow-up with [place] on this.

## 2019-01-24 ENCOUNTER — Encounter: Payer: Self-pay | Admitting: *Deleted

## 2019-01-24 ENCOUNTER — Telehealth (INDEPENDENT_AMBULATORY_CARE_PROVIDER_SITE_OTHER): Payer: PPO | Admitting: Cardiology

## 2019-01-24 ENCOUNTER — Telehealth: Payer: Self-pay | Admitting: Cardiology

## 2019-01-24 ENCOUNTER — Other Ambulatory Visit: Payer: Self-pay

## 2019-01-24 ENCOUNTER — Encounter: Payer: Self-pay | Admitting: Cardiology

## 2019-01-24 VITALS — BP 123/74 | HR 59 | Ht 62.0 in | Wt 237.0 lb

## 2019-01-24 DIAGNOSIS — Z7901 Long term (current) use of anticoagulants: Secondary | ICD-10-CM

## 2019-01-24 DIAGNOSIS — I480 Paroxysmal atrial fibrillation: Secondary | ICD-10-CM | POA: Diagnosis not present

## 2019-01-24 DIAGNOSIS — I119 Hypertensive heart disease without heart failure: Secondary | ICD-10-CM

## 2019-01-24 HISTORY — DX: Long term (current) use of anticoagulants: Z79.01

## 2019-01-24 NOTE — Telephone Encounter (Signed)
error 

## 2019-01-24 NOTE — Patient Instructions (Signed)

## 2019-01-24 NOTE — Progress Notes (Signed)
Virtual Visit via Telephone Note   This visit type was conducted due to national recommendations for restrictions regarding the COVID-19 Pandemic (e.g. social distancing) in an effort to limit this patient's exposure and mitigate transmission in our community.  Due to her co-morbid illnesses, this patient is at least at moderate risk for complications without adequate follow up.  This format is felt to be most appropriate for this patient at this time.  The patient did not have access to video technology/had technical difficulties with video requiring transitioning to audio format only (telephone).  All issues noted in this document were discussed and addressed.  No physical exam could be performed with this format.  Please refer to the patient's chart for her  consent to telehealth for [facility].   Date:  01/24/2019   ID:  [name], DOB Jul 15, 1946, MRN [id number]  Patient Location: Home Provider Location: Office  PCP:  Kathyrn Lass, MD  Cardiologist:  Shirlee More, MD  Electrophysiologist:  None   Evaluation Performed:  Follow-Up Visit  Chief Complaint:  Atrial fibrillation  History of Present Illness:    [name] is a 73 y.o. female with a history of chaotic atrial rhythm captured at the time of a GI evaluation for stool hematest positive last seen 12/10/18.  She utilize an extended ambulatory heart rhythm monitor which showed frequent supraventricular ectopy with frequent episodes of SVT as well as atrial fibrillation.  The atrial fibrillation was associated with a rapid response and she was started on a beta blocker.  Her stroke risk is moderate female sex age over 64 and history of hypertension and started on apixaban for stroke prophylaxis.  The patient does not have symptoms concerning for COVID-19 infection (fever, chills, cough, or new shortness of breath).   She has had no clinical recurrence of atrial fibrillation tolerates her anticoagulant without bleeding  complication.  No edema chest pain shortness of breath syncope or TIA.  She intends to purchase the iPhone adapter to screen heart rhythm at home  Past Medical History:  Diagnosis Date  . Atrial fibrillation ([place])   . Heart murmur    --innocent  . Hypertension    Past Surgical History:  Procedure Laterality Date  . ABDOMINAL HYSTERECTOMY  03/2010   TAH/BSO  . CESAREAN SECTION  1981  . 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 ROTATOR CUFF REPAIR Left 2010  . TOTAL KNEE ARTHROPLASTY Bilateral      Current Meds  Medication Sig  . albuterol (PROAIR HFA) 108 (90 Base) MCG/ACT inhaler ProAir HFA 90 mcg/actuation aerosol inhaler  INHALE 1 TO 2 PUFFS EVERY 4 TO 6 HOURS AS NEEDED 30 DAYS  . apixaban (ELIQUIS) 5 MG TABS tablet Take 1 tablet (5 mg total) by mouth 2 (two) times daily.  Marland Kitchen losartan-hydrochlorothiazide (HYZAAR) 50-12.5 MG tablet Take 1 tablet by mouth daily.  . metoprolol tartrate (LOPRESSOR) 50 MG tablet TAKE 1 TABLET BY MOUTH TWICE A DAY  . Vitamin D, Ergocalciferol, (DRISDOL) 1.25 MG (50000 UT) CAPS capsule TAKE 1 CAPSULE BY MOUTH 2 TIMES A WEEK     Allergies:   Codeine and Sulfa antibiotics   Social History   Tobacco Use  . Smoking status: Never Smoker  . Smokeless tobacco: Never Used  Substance Use Topics  . Alcohol use: Yes    Comment: occ wine  . Drug use: Never     Family Hx: The patient's family history includes Breast cancer in her  mother, paternal aunt, and paternal aunt; Heart attack in her maternal grandfather; Stroke in her paternal aunt and another family member.  ROS:   Please see the history of present illness.     All other systems reviewed and are negative.   Prior CV studies:   The following studies were reviewed today:  Labs/Other Tests and Data Reviewed:    EKG:  No ECG reviewed.  Recent Labs: No results found for requested labs within last 8760 hours.   Recent Lipid Panel No results found  for: CHOL, TRIG, HDL, CHOLHDL, LDLCALC, LDLDIRECT  Wt Readings from Last 3 Encounters:  01/24/19 237 lb (107.5 kg)  12/10/18 236 lb (107 kg)  10/19/18 229 lb 1.9 oz (103.9 kg)     Objective:    Vital Signs:  BP 123/74   Pulse (!) 59   Ht 5\' 2"  (1.575 m)   Wt 237 lb (107.5 kg)   BMI 43.35 kg/m    VITAL SIGNS:  reviewed her  ASSESSMENT & PLAN:    1. Atrial fibrillation paroxysmal improved continue beta-blocker anticoagulant I have asked her to go ahead and purchase the iPhone adapter to record heart rhythm and if we document recurrent atrial fibrillation would benefit from antiarrhythmic drug like flecainide. 2. Chronic anticoagulation continue the same stroke risk is moderate age female sex 61. COVID-19, at high risk I asked her to remain out of work until after her surge 03/06/2019  COVID-19 Education: The signs and symptoms of COVID-19 were discussed with the patient and how to seek care for testing (follow up with PCP or arrange E-visit).  The importance of social distancing was discussed today.  Time:   Today, I have spent 20 minutes with the patient with telehealth technology discussing the above problems.     Medication Adjustments/Labs and Tests Ordered: Current medicines are reviewed at length with the patient today.  Concerns regarding medicines are outlined above.   Tests Ordered: No orders of the defined types were placed in this encounter.   Medication Changes: No orders of the defined types were placed in this encounter.   Disposition:  Follow up in 3 month(s)  Signed, Shirlee More, MD  01/24/2019 11:06 AM    [place]

## 2019-01-31 ENCOUNTER — Telehealth: Payer: Self-pay | Admitting: *Deleted

## 2019-01-31 NOTE — Telephone Encounter (Signed)
Pt would like letter stating she was seen on 5/21 at 10:45 faxed to [phone] c/o Janit Bern at [facility]. Faxed letter today.

## 2019-02-08 DIAGNOSIS — H35371 Puckering of macula, right eye: Secondary | ICD-10-CM | POA: Diagnosis not present

## 2019-03-01 ENCOUNTER — Telehealth: Payer: Self-pay | Admitting: Cardiology

## 2019-03-01 NOTE — Telephone Encounter (Signed)
Please call about going back to work

## 2019-03-01 NOTE — Telephone Encounter (Signed)
Returned patients' call, no answer, left voice message with callback number.

## 2019-03-03 ENCOUNTER — Other Ambulatory Visit: Payer: Self-pay | Admitting: Cardiology

## 2019-03-05 NOTE — Telephone Encounter (Signed)
Patient returned your call, please call back at [phone]

## 2019-03-05 NOTE — Telephone Encounter (Signed)
She may return to work without restrictions. Please write letter and I will sign. Ask her if she would like to pick up at [facility] or [place]. (if [place] we can fax over there)

## 2019-03-05 NOTE — Telephone Encounter (Signed)
Return to work clearance letter  given to patient who has no further questions or concerns.

## 2019-03-05 NOTE — Telephone Encounter (Signed)
Patient has been on medical leave but needs to return to work starting tomorrow because her FMLA runs out and she could lose her job. She would like to pick up the letter releasing her to return to work by 4 pm today if possible. 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   pls advise, tx

## 2019-03-18 DIAGNOSIS — R195 Other fecal abnormalities: Secondary | ICD-10-CM | POA: Diagnosis not present

## 2019-03-18 DIAGNOSIS — D125 Benign neoplasm of sigmoid colon: Secondary | ICD-10-CM | POA: Diagnosis not present

## 2019-03-18 DIAGNOSIS — D122 Benign neoplasm of ascending colon: Secondary | ICD-10-CM | POA: Diagnosis not present

## 2019-03-18 DIAGNOSIS — K317 Polyp of stomach and duodenum: Secondary | ICD-10-CM | POA: Diagnosis not present

## 2019-03-18 DIAGNOSIS — K2930 Chronic superficial gastritis without bleeding: Secondary | ICD-10-CM | POA: Diagnosis not present

## 2019-03-18 DIAGNOSIS — K5289 Other specified noninfective gastroenteritis and colitis: Secondary | ICD-10-CM | POA: Diagnosis not present

## 2019-03-18 DIAGNOSIS — R1314 Dysphagia, pharyngoesophageal phase: Secondary | ICD-10-CM | POA: Diagnosis not present

## 2019-03-18 DIAGNOSIS — R110 Nausea: Secondary | ICD-10-CM | POA: Diagnosis not present

## 2019-03-18 DIAGNOSIS — K648 Other hemorrhoids: Secondary | ICD-10-CM | POA: Diagnosis not present

## 2019-03-19 ENCOUNTER — Telehealth: Payer: Self-pay | Admitting: *Deleted

## 2019-03-19 DIAGNOSIS — Z20822 Contact with and (suspected) exposure to covid-19: Secondary | ICD-10-CM

## 2019-03-19 NOTE — Telephone Encounter (Signed)
Pt referred for testing by Charletta Cousin. Reason for testing: headache,fever Insurance: [employer] PPO  Pt called and scheduled for testing at [facility] site on 03/20/19. Pt advised to wear a mask and remain in the car. Understanding verbalized.

## 2019-03-19 NOTE — Addendum Note (Signed)
Addended by: Dimple Nanas on: 03/19/2019 04:37 PM   Modules accepted: Orders

## 2019-03-20 ENCOUNTER — Ambulatory Visit (HOSPITAL_COMMUNITY)
Admission: RE | Admit: 2019-03-20 | Discharge: 2019-03-20 | Disposition: A | Discharge status: Home / Self Care | Payer: PPO | Source: Ambulatory Visit | Attending: Gastroenterology | Admitting: Gastroenterology

## 2019-03-20 ENCOUNTER — Other Ambulatory Visit: Payer: PPO

## 2019-03-20 ENCOUNTER — Other Ambulatory Visit: Payer: Self-pay

## 2019-03-20 ENCOUNTER — Other Ambulatory Visit (HOSPITAL_COMMUNITY): Payer: Self-pay | Admitting: Gastroenterology

## 2019-03-20 DIAGNOSIS — K5641 Fecal impaction: Secondary | ICD-10-CM | POA: Diagnosis not present

## 2019-03-20 DIAGNOSIS — Z20822 Contact with and (suspected) exposure to covid-19: Secondary | ICD-10-CM

## 2019-03-20 DIAGNOSIS — R5082 Postprocedural fever: Secondary | ICD-10-CM

## 2019-03-21 DIAGNOSIS — D125 Benign neoplasm of sigmoid colon: Secondary | ICD-10-CM | POA: Diagnosis not present

## 2019-03-21 DIAGNOSIS — D122 Benign neoplasm of ascending colon: Secondary | ICD-10-CM | POA: Diagnosis not present

## 2019-03-21 DIAGNOSIS — K5289 Other specified noninfective gastroenteritis and colitis: Secondary | ICD-10-CM | POA: Diagnosis not present

## 2019-03-21 DIAGNOSIS — K317 Polyp of stomach and duodenum: Secondary | ICD-10-CM | POA: Diagnosis not present

## 2019-03-21 DIAGNOSIS — K2930 Chronic superficial gastritis without bleeding: Secondary | ICD-10-CM | POA: Diagnosis not present

## 2019-03-25 ENCOUNTER — Ambulatory Visit: Payer: PPO | Admitting: Cardiology

## 2019-03-25 LAB — NOVEL CORONAVIRUS, NAA: SARS-CoV-2, NAA: NOT DETECTED

## 2019-04-11 ENCOUNTER — Telehealth: Payer: Self-pay | Admitting: Cardiology

## 2019-04-12 NOTE — Telephone Encounter (Signed)
Left message on pt's voice mail to return call if still have question.

## 2019-04-22 ENCOUNTER — Other Ambulatory Visit: Payer: Self-pay | Admitting: Cardiology

## 2019-04-24 DIAGNOSIS — I471 Supraventricular tachycardia, unspecified: Secondary | ICD-10-CM

## 2019-04-24 HISTORY — DX: Supraventricular tachycardia: I47.1

## 2019-04-24 HISTORY — DX: Supraventricular tachycardia, unspecified: I47.10

## 2019-04-24 NOTE — Progress Notes (Signed)
Cardiology Office Note:    Date:  04/25/2019   ID:  [name], DOB 10-23-1945, MRN [id number]  PCP:  Erika Lass, MD  Cardiologist:  Erika More, MD    Referring MD: Erika Lass, MD    ASSESSMENT:    1. Hypertensive heart disease without heart failure   2. PAF (paroxysmal atrial fibrillation) ([place])   3. Chronic anticoagulation    PLAN:    In order of problems listed above:  1. Reduce beta-blocker dose with relative bradycardia and fatigue 2. Stable continue anticoagulant she will has purchase the iPhone adapter start recording her heart rhythm daily continue anticoagulant labs tomorrow with fatigue including a CBC renal function potassium and proBNP level 3. Continue her anticoagulant   Next appointment: 6 weeks   Medication Adjustments/Labs and Tests Ordered: Current medicines are reviewed at length with the patient today.  Concerns regarding medicines are outlined above.  No orders of the defined types were placed in this encounter.  No orders of the defined types were placed in this encounter.   Chief Complaint  Patient presents with  . Follow-up  . Atrial Fibrillation    History of Present Illness:    [name] is a 73 y.o. female with a hx of paroxysmal atrial fibrillation on anticoagulation, SVT, HTN last seen 01/24/19. Compliance with diet, lifestyle and medications: Yes  She requests to be seen for several reasons the first is she feels fatigued thinks that the beta-blocker effect.  She has had some intermittent palpitation unsure if she has had atrial fibrillation ordered the iPhone adapter arriving tomorrow she is in a regular rhythm and I defer an EKG today because it is obvious that she is not in atrial fibrillation.  The other she has developed edema and exertional shortness of breath.  I Georgina Peer put her on a low-dose of a loop diuretic furosemide 2 days a week tomorrow she is seeing her PCP and she will have labs done including a CBC renal  function potassium and proBNP level.  I will see back in 1 month and for capturing frequent episodes of atrial fibrillation she will need antiarrhythmic drug or referral to EP for catheter ablation.  No chest pain syncope or bleeding.  She did have a colonoscopy performed Past Medical History:  Diagnosis Date  . Atrial fibrillation ([place])   . Heart murmur    --innocent  . Hypertension     Past Surgical History:  Procedure Laterality Date  . ABDOMINAL HYSTERECTOMY  03/2010   TAH/BSO  . CESAREAN SECTION  1981  . 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 ROTATOR CUFF REPAIR Left 2010  . TOTAL KNEE ARTHROPLASTY Bilateral     Current Medications: Current Meds  Medication Sig  . albuterol (PROAIR HFA) 108 (90 Base) MCG/ACT inhaler ProAir HFA 90 mcg/actuation aerosol inhaler  INHALE 1 TO 2 PUFFS EVERY 4 TO 6 HOURS AS NEEDED 30 DAYS  . ELIQUIS 5 MG TABS tablet TAKE 1 TABLET BY MOUTH TWICE A DAY  . losartan-hydrochlorothiazide (HYZAAR) 50-12.5 MG tablet Take 1 tablet by mouth daily.  . metoprolol tartrate (LOPRESSOR) 50 MG tablet TAKE 1 TABLET BY MOUTH TWICE A DAY  . Vitamin D, Ergocalciferol, (DRISDOL) 1.25 MG (50000 UT) CAPS capsule TAKE 1 CAPSULE BY MOUTH 2 TIMES A WEEK     Allergies:   Codeine and Sulfa antibiotics   Social History   Socioeconomic History  . Marital status: Divorced    Spouse name:  Not on file  . Number of children: Not on file  . Years of education: Not on file  . Highest education level: Not on file  Occupational History  . Not on file  Social Needs  . Financial resource strain: Not on file  . Food insecurity    Worry: Not on file    Inability: Not on file  . Transportation needs    Medical: Not on file    Non-medical: Not on file  Tobacco Use  . Smoking status: Never Smoker  . Smokeless tobacco: Never Used  Substance and Sexual Activity  . Alcohol use: Yes    Comment: occ wine  . Drug use: Never  . Sexual  activity: Never    Partners: Male    Birth control/protection: Surgical    Comment: TAH/BSO  Lifestyle  . Physical activity    Days per week: Not on file    Minutes per session: Not on file  . Stress: Not on file  Relationships  . Social Herbalist on phone: Not on file    Gets together: Not on file    Attends religious service: Not on file    Active member of club or organization: Not on file    Attends meetings of clubs or organizations: Not on file    Relationship status: Not on file  Other Topics Concern  . Not on file  Social History Narrative  . Not on file     Family History: The patient's family history includes Breast cancer in her mother, paternal aunt, and paternal aunt; Heart attack in her maternal grandfather; Stroke in her paternal aunt and another family member. ROS:   Please see the history of present illness.    All other systems reviewed and are negative.  EKGs/Labs/Other Studies Reviewed:    The following studies were reviewed today:    Recent Labs: No results found for requested labs within last 8760 hours.  Recent Lipid Panel No results found for: CHOL, TRIG, HDL, CHOLHDL, VLDL, LDLCALC, LDLDIRECT  Physical Exam:    VS:  BP 130/74 (BP Location: Right Arm, Patient Position: Sitting, Cuff Size: Large)   Pulse (!) 51   Temp 98.2 F (36.8 C)   Ht 5\' 2"  (1.575 m)   Wt 237 lb 6.4 oz (107.7 kg)   SpO2 97%   BMI 43.42 kg/m     Wt Readings from Last 3 Encounters:  04/25/19 237 lb 6.4 oz (107.7 kg)  01/24/19 237 lb (107.5 kg)  12/10/18 236 lb (107 kg)     GEN: Central obesity BMI greater than 40 well nourished, well developed in no acute distress HEENT: Normal NECK: No JVD; No carotid bruits LYMPHATICS: No lymphadenopathy CARDIAC: RRR, no murmurs, rubs, gallops RESPIRATORY:  Clear to auscultation without rales, wheezing or rhonchi  ABDOMEN: Soft, non-tender, non-distended MUSCULOSKELETAL: 1-2+ pitting lower extremity edema  bilateral edema; No deformity  SKIN: Warm and dry NEUROLOGIC:  Alert and oriented x 3 PSYCHIATRIC:  Normal affect    Signed, Erika More, MD  04/25/2019 9:43 AM    [place]

## 2019-04-25 ENCOUNTER — Encounter: Payer: Self-pay | Admitting: Cardiology

## 2019-04-25 ENCOUNTER — Ambulatory Visit (INDEPENDENT_AMBULATORY_CARE_PROVIDER_SITE_OTHER): Payer: PPO | Admitting: Cardiology

## 2019-04-25 ENCOUNTER — Other Ambulatory Visit: Payer: Self-pay

## 2019-04-25 VITALS — BP 130/74 | HR 51 | Temp 98.2°F | Ht 62.0 in | Wt 237.4 lb

## 2019-04-25 DIAGNOSIS — I480 Paroxysmal atrial fibrillation: Secondary | ICD-10-CM

## 2019-04-25 DIAGNOSIS — R0602 Shortness of breath: Secondary | ICD-10-CM | POA: Diagnosis not present

## 2019-04-25 DIAGNOSIS — Z7901 Long term (current) use of anticoagulants: Secondary | ICD-10-CM

## 2019-04-25 DIAGNOSIS — I119 Hypertensive heart disease without heart failure: Secondary | ICD-10-CM | POA: Diagnosis not present

## 2019-04-25 MED ORDER — FUROSEMIDE 20 MG PO TABS: 20.0000 mg | ORAL_TABLET | ORAL | 0 refills | Status: DC

## 2019-04-25 MED ORDER — METOPROLOL TARTRATE 25 MG PO TABS: 25.0000 mg | ORAL_TABLET | Freq: Two times a day (BID) | ORAL | 3 refills | Status: DC

## 2019-04-25 NOTE — Patient Instructions (Signed)
Medication Instructions:  Your physician has recommended you make the following change in your medication:   DECREASE metoprolol tartrate (lopressor) 25 mg: Take 1 tablet daily  START furosemide (lasix) 20 mg: Take 1 tablet twice a week  If you need a refill on your cardiac medications before your next appointment, please call your pharmacy.   Lab work: Your physician recommends that you return for lab work tomorrow: CBC, BMP, ProBNP. Please return to our office for lab work, no appointment needed. No need to fast beforehand.   If you have labs (blood work) drawn today and your tests are completely normal, you will receive your results only by: Marland Kitchen MyChart Message (if you have MyChart) OR . A paper copy in the mail If you have any lab test that is abnormal or we need to change your treatment, we will call you to review the results.  Testing/Procedures: None  Follow-Up: At [facility], you and your health needs are our priority.  As part of our continuing mission to provide you with exceptional heart care, we have created designated Provider Care Teams.  These Care Teams include your primary Cardiologist (physician) and Advanced Practice Providers (APPs -  Physician Assistants and Nurse Practitioners) who all work together to provide you with the care you need, when you need it. You will need a follow up appointment in 6 weeks.      Furosemide tablets What is this medicine? FUROSEMIDE (fyoor [place] se mide) is a diuretic. It helps you make more urine and to lose salt and excess water from your body. This medicine is used to treat high blood pressure, and edema or swelling from heart, kidney, or liver disease. This medicine may be used for other purposes; ask your health care provider or pharmacist if you have questions. COMMON BRAND NAME(S): Active-Medicated Specimen Kit, Delone, Diuscreen, Lasix, RX Specimen Collection Kit, Specimen Collection Kit, URINX Medicated Specimen Collection What  should I tell my health care provider before I take this medicine? They need to know if you have any of these conditions:  abnormal blood electrolytes  diarrhea or vomiting  gout  heart disease  kidney disease, small amounts of urine, or difficulty passing urine  liver disease  thyroid disease  an unusual or allergic reaction to furosemide, sulfa drugs, other medicines, foods, dyes, or preservatives  pregnant or trying to get pregnant  breast-feeding How should I use this medicine? Take this medicine by mouth with a glass of water. Follow the directions on the prescription label. You may take this medicine with or without food. If it upsets your stomach, take it with food or milk. Do not take your medicine more often than directed. Remember that you will need to pass more urine after taking this medicine. Do not take your medicine at a time of day that will cause you problems. Do not take at bedtime. Talk to your pediatrician regarding the use of this medicine in children. While this drug may be prescribed for selected conditions, precautions do apply. Overdosage: If you think you have taken too much of this medicine contact a poison control center or emergency room at once. NOTE: This medicine is only for you. Do not share this medicine with others. What if I miss a dose? If you miss a dose, take it as soon as you can. If it is almost time for your next dose, take only that dose. Do not take double or extra doses. What may interact with this medicine?  aspirin  and aspirin-like medicines  certain antibiotics  chloral hydrate  cisplatin  cyclosporine  digoxin  diuretics  laxatives  lithium  medicines for blood pressure  medicines that relax muscles for surgery  methotrexate  NSAIDs, medicines for pain and inflammation like ibuprofen, naproxen, or indomethacin  phenytoin  steroid medicines like prednisone or cortisone  sucralfate  thyroid hormones This  list may not describe all possible interactions. Give your health care provider a list of all the medicines, herbs, non-prescription drugs, or dietary supplements you use. Also tell them if you smoke, drink alcohol, or use illegal drugs. Some items may interact with your medicine. What should I watch for while using this medicine? Visit your doctor or health care provider for regular checks on your progress. Check your blood pressure regularly. Ask your doctor or health care provider what your blood pressure should be, and when you should contact him or her. If you are a diabetic, check your blood sugar as directed. This medicine may cause serious skin reactions. They can happen weeks to months after starting the medicine. Contact your health care provider right away if you notice fevers or flu-like symptoms with a rash. The rash may be red or purple and then turn into blisters or peeling of the skin. Or, you might notice a red rash with swelling of the face, lips or lymph nodes in your neck or under your arms. You may need to be on a special diet while taking this medicine. Check with your doctor. Also, ask how many glasses of fluid you need to drink a day. You must not get dehydrated. You may get drowsy or dizzy. Do not drive, use machinery, or do anything that needs mental alertness until you know how this drug affects you. Do not stand or sit up quickly, especially if you are an older patient. This reduces the risk of dizzy or fainting spells. Alcohol can make you more drowsy and dizzy. Avoid alcoholic drinks. This medicine can make you more sensitive to the sun. Keep out of the sun. If you cannot avoid being in the sun, wear protective clothing and use sunscreen. Do not use sun lamps or tanning beds/booths. What side effects may I notice from receiving this medicine? Side effects that you should report to your doctor or health care professional as soon as possible:  blood in urine or stools  dry  mouth  fever or chills  hearing loss or ringing in the ears  irregular heartbeat  muscle pain or weakness, cramps  rash, fever, and swollen lymph nodes  redness, blistering, peeling or loosening of the skin, including inside the mouth  skin rash  stomach upset, pain, or nausea  tingling or numbness in the hands or feet  unusually weak or tired  vomiting or diarrhea  yellowing of the eyes or skin Side effects that usually do not require medical attention (report to your doctor or health care professional if they continue or are bothersome):  headache  loss of appetite  unusual bleeding or bruising This list may not describe all possible side effects. Call your doctor for medical advice about side effects. You may report side effects to FDA at 1-800-FDA-1088. Where should I keep my medicine? Keep out of the reach of children. Store at room temperature between 15 and 30 degrees C (59 and 86 degrees F). Protect from light. Throw away any unused medicine after the expiration date. NOTE: This sheet is a summary. It may not cover all possible information. If  you have questions about this medicine, talk to your doctor, pharmacist, or health care provider.  2020 Elsevier/Gold Standard (2018-11-23 14:04:13)

## 2019-04-26 DIAGNOSIS — I10 Essential (primary) hypertension: Secondary | ICD-10-CM | POA: Diagnosis not present

## 2019-04-26 DIAGNOSIS — I4891 Unspecified atrial fibrillation: Secondary | ICD-10-CM | POA: Diagnosis not present

## 2019-04-26 DIAGNOSIS — J4520 Mild intermittent asthma, uncomplicated: Secondary | ICD-10-CM | POA: Diagnosis not present

## 2019-04-26 DIAGNOSIS — E559 Vitamin D deficiency, unspecified: Secondary | ICD-10-CM | POA: Diagnosis not present

## 2019-04-26 DIAGNOSIS — K219 Gastro-esophageal reflux disease without esophagitis: Secondary | ICD-10-CM | POA: Diagnosis not present

## 2019-04-26 DIAGNOSIS — Z23 Encounter for immunization: Secondary | ICD-10-CM | POA: Diagnosis not present

## 2019-04-26 DIAGNOSIS — Z6841 Body Mass Index (BMI) 40.0 and over, adult: Secondary | ICD-10-CM | POA: Diagnosis not present

## 2019-04-29 ENCOUNTER — Ambulatory Visit: Payer: PPO | Admitting: Cardiology

## 2019-05-09 DIAGNOSIS — R739 Hyperglycemia, unspecified: Secondary | ICD-10-CM | POA: Diagnosis not present

## 2019-06-06 ENCOUNTER — Other Ambulatory Visit: Payer: Self-pay

## 2019-06-06 ENCOUNTER — Ambulatory Visit (INDEPENDENT_AMBULATORY_CARE_PROVIDER_SITE_OTHER): Payer: PPO | Admitting: Cardiology

## 2019-06-06 VITALS — BP 132/68 | HR 50 | Temp 98.1°F | Ht 62.0 in | Wt 239.8 lb

## 2019-06-06 DIAGNOSIS — R5381 Other malaise: Secondary | ICD-10-CM | POA: Diagnosis not present

## 2019-06-06 DIAGNOSIS — R0602 Shortness of breath: Secondary | ICD-10-CM | POA: Diagnosis not present

## 2019-06-06 DIAGNOSIS — I119 Hypertensive heart disease without heart failure: Secondary | ICD-10-CM | POA: Diagnosis not present

## 2019-06-06 DIAGNOSIS — R5383 Other fatigue: Secondary | ICD-10-CM

## 2019-06-06 DIAGNOSIS — Z7901 Long term (current) use of anticoagulants: Secondary | ICD-10-CM

## 2019-06-06 DIAGNOSIS — I480 Paroxysmal atrial fibrillation: Secondary | ICD-10-CM | POA: Diagnosis not present

## 2019-06-06 MED ORDER — VERAPAMIL HCL 80 MG PO TABS: 80.0000 mg | ORAL_TABLET | Freq: Two times a day (BID) | ORAL | 3 refills | Status: DC

## 2019-06-06 NOTE — Patient Instructions (Signed)
Medication Instructions:  Your physician has recommended you make the following change in your medication:   STOP metoprolol  START verapamil (calan) 80 mg: Take 1 tablet twice daily AFTER discontinuing  Metoprolol for 24 hours  If you need a refill on your cardiac medications before your next appointment, please call your pharmacy.   Lab work: Your physician recommends that you return for lab work today: CBC, BMP, ProBNP.   If you have labs (blood work) drawn today and your tests are completely normal, you will receive your results only by: Marland Kitchen MyChart Message (if you have MyChart) OR . A paper copy in the mail If you have any lab test that is abnormal or we need to change your treatment, we will call you to review the results.  Testing/Procedures: None  Follow-Up: At [facility], you and your health needs are our priority.  As part of our continuing mission to provide you with exceptional heart care, we have created designated Provider Care Teams.  These Care Teams include your primary Cardiologist (physician) and Advanced Practice Providers (APPs -  Physician Assistants and Nurse Practitioners) who all work together to provide you with the care you need, when you need it. You will need a follow up appointment in 3 months.       Verapamil tablets What is this medicine? VERAPAMIL (ver AP a mil) is a calcium-channel blocker. It affects the amount of calcium found in your heart and muscle cells. This relaxes your blood vessels, which can reduce the amount of work the heart has to do. This medicine is used to treat chest pain caused by angina, high blood pressure, and controls heart rate in certain conditions. This medicine may be used for other purposes; ask your health care provider or pharmacist if you have questions. COMMON BRAND NAME(S): Calan What should I tell my health care provider before I take this medicine? They need to know if you have any of these conditions:  heart or  blood vessel disease  heart rhythm disturbances such as sick sinus syndrome, ventricular arrhythmias, Wolff-Parkinson-White syndrome, or Lown-Ganong-Levine syndrome  liver or kidney disease  low blood pressure  an unusual or allergic reaction to verapamil, other medicines, foods, dyes, or preservatives  pregnant or trying to get pregnant  breast-feeding How should I use this medicine? Take this medicine by mouth with a glass of water. Follow the directions on the prescription label. This medicine can be taken with or without food. Take your doses at regular intervals. Do not take your medicine more often than directed. Talk to your pediatrician regarding the use of this medicine in children. Special care may be needed. Overdosage: If you think you have taken too much of this medicine contact a poison control center or emergency room at once. NOTE: This medicine is only for you. Do not share this medicine with others. What if I miss a dose? If you miss a dose, take it as soon as you can. If it is almost time for your next dose, take only that dose. Do not take double or extra doses. What may interact with this medicine? Do not take this medicine with any of the following:  cisapride  disopyramide  dofetilide  grapefruit juice  hawthorn  pimozide  red yeast rice This medicine may also interact with the following medications:  barbiturates such as phenobarbital  cimetidine  cyclosporine  lithium  local anesthetics or general anesthetics  medicines for heart rhythm problems like amiodarone, digoxin, flecainide, procainamide,  quinidine  medicines for high blood pressure or heart problems  medicines for seizures like carbamazepine and phenytoin  rifampin, rifabutin or rifapentine  theophylline or aminophylline This list may not describe all possible interactions. Give your health care provider a list of all the medicines, herbs, non-prescription drugs, or dietary  supplements you use. Also tell them if you smoke, drink alcohol, or use illegal drugs. Some items may interact with your medicine. What should I watch for while using this medicine? Check your blood pressure and pulse rate regularly. Ask your doctor or health care professional what your blood pressure and pulse rate should be and when you should contact him or her. Do not suddenly stop taking this medicine. Ask your doctor or health care professional how to gradually reduce the dose. You may get drowsy or dizzy. Do not drive, use machinery, or do anything that needs mental alertness until you know how this medicine affects you. Do not stand or sit up quickly, especially if you are an older patient. This reduces the risk of dizzy or fainting spells. Alcohol may interfere with the effect of this medicine. Avoid alcoholic drinks. What side effects may I notice from receiving this medicine? Side effects that you should report to your doctor or health care professional as soon as possible:  difficulty breathing  dizziness or light headedness  fainting  fast heartbeat, palpitations, irregular heartbeat, or chest pain  skin rash  slow heartbeat  swelling of the legs or ankles Side effects that usually do not require medical attention (report to your doctor or health care professional if they continue or are bothersome):  constipation  facial flushing  headache  nausea, vomiting  sexual dysfunction  weakness or tiredness This list may not describe all possible side effects. Call your doctor for medical advice about side effects. You may report side effects to FDA at 1-800-FDA-1088. Where should I keep my medicine? Keep out of the reach of children. Store at room temperature between 15 and 25 degrees C (59 and 77 degrees F). Protect from light. Keep container tightly closed. Throw away any unused medicine after the expiration date. NOTE: This sheet is a summary. It may not cover all  possible information. If you have questions about this medicine, talk to your doctor, pharmacist, or health care provider.  2020 Elsevier/Gold Standard (2008-05-19 17:23:53)

## 2019-06-06 NOTE — Progress Notes (Signed)
Cardiology Office Note:    Date:  06/06/2019   ID:  [name], DOB Sep 21, 1945, MRN [id number]  PCP:  Kathyrn Lass, MD  Cardiologist:  Shirlee More, MD    Referring MD: Kathyrn Lass, MD    ASSESSMENT:    1. PAF (paroxysmal atrial fibrillation) ([place])   2. Chronic anticoagulation   3. Hypertensive heart disease without heart failure   4. Malaise and fatigue    PLAN:    In order of problems listed above:  1. Stable continue anticoagulation withdraw the beta-blocker with perceived side effects and malaise cough and bronchospasm and use a low-dose of a calcium channel blocker.  Strongly encouraged her to set up her iPhone adapter and screen her heart rhythm. 2. Continue anticoagulant 3. See above #1 4. Check labs at risk for anemia and check proBNP with shortness of breath to be sure she is not exhibiting heart failure symptoms.   Next appointment: 3 months   Medication Adjustments/Labs and Tests Ordered: Current medicines are reviewed at length with the patient today.  Concerns regarding medicines are outlined above.  No orders of the defined types were placed in this encounter.  No orders of the defined types were placed in this encounter.   Chief Complaint  Patient presents with  . Follow-up    After beta-blocker was decreased  . Atrial Fibrillation    History of Present Illness:    [name] is a 73 y.o. female with a hx of paroxysmal atrial fibrillation on anticoagulation, SVT, HTN  last seen 04/25/2019 and her beta-blocker dosage was decreased with relative bradycardia and fatigue. Compliance with diet, lifestyle and medications: Yes  She has had no episodes of atrial fibrillation but has yet to set up the iPhone adapter to record her heart rhythm.  Despite decreasing her beta-blocker she still has malaise fatigue as a cough wheezing and has shortness of breath with activity.  I think we need to stop a beta-blocker I would not initiate an antiarrhythmic  drug I will put her on a low-dose calcium channel blocker check labs to be sure that she is not anemic anticoagulated will get renal function and a proBNP to screen for heart failure although she has no physical findings.  The symptoms do not seem to be correlated with recurrent atrial fibrillation and she will remain anti-coagulated.  Orthopnea chest pain or syncope she has had no palpitation Past Medical History:  Diagnosis Date  . Atrial fibrillation ([place])   . Heart murmur    --innocent  . Hypertension     Past Surgical History:  Procedure Laterality Date  . ABDOMINAL HYSTERECTOMY  03/2010   TAH/BSO  . CESAREAN SECTION  1981  . 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 ROTATOR CUFF REPAIR Left 2010  . TOTAL KNEE ARTHROPLASTY Bilateral     Current Medications: Current Meds  Medication Sig  . albuterol (PROAIR HFA) 108 (90 Base) MCG/ACT inhaler ProAir HFA 90 mcg/actuation aerosol inhaler  INHALE 1 TO 2 PUFFS EVERY 4 TO 6 HOURS AS NEEDED 30 DAYS  . ELIQUIS 5 MG TABS tablet TAKE 1 TABLET BY MOUTH TWICE A DAY  . furosemide (LASIX) 20 MG tablet Take 1 tablet (20 mg total) by mouth 2 (two) times a week.  . losartan-hydrochlorothiazide (HYZAAR) 50-12.5 MG tablet Take 1 tablet by mouth daily.  . metoprolol tartrate (LOPRESSOR) 25 MG tablet Take 1 tablet (25 mg total) by mouth 2 (two) times daily.  Marland Kitchen  pantoprazole (PROTONIX) 40 MG tablet Take 40 mg by mouth daily as needed.  . Vitamin D, Ergocalciferol, (DRISDOL) 1.25 MG (50000 UT) CAPS capsule TAKE 1 CAPSULE BY MOUTH 2 TIMES A WEEK     Allergies:   Codeine and Sulfa antibiotics   Social History   Socioeconomic History  . Marital status: Divorced    Spouse name: Not on file  . Number of children: Not on file  . Years of education: Not on file  . Highest education level: Not on file  Occupational History  . Not on file  Social Needs  . Financial resource strain: Not on file  . Food insecurity     Worry: Not on file    Inability: Not on file  . Transportation needs    Medical: Not on file    Non-medical: Not on file  Tobacco Use  . Smoking status: Never Smoker  . Smokeless tobacco: Never Used  Substance and Sexual Activity  . Alcohol use: Yes    Comment: occ wine  . Drug use: Never  . Sexual activity: Never    Partners: Male    Birth control/protection: Surgical    Comment: TAH/BSO  Lifestyle  . Physical activity    Days per week: Not on file    Minutes per session: Not on file  . Stress: Not on file  Relationships  . Social Herbalist on phone: Not on file    Gets together: Not on file    Attends religious service: Not on file    Active member of club or organization: Not on file    Attends meetings of clubs or organizations: Not on file    Relationship status: Not on file  Other Topics Concern  . Not on file  Social History Narrative  . Not on file     Family History: The patient's family history includes Breast cancer in her mother, paternal aunt, and paternal aunt; Heart attack in her maternal grandfather; Stroke in her paternal aunt and another family member. ROS:   Please see the history of present illness.    All other systems reviewed and are negative.  EKGs/Labs/Other Studies Reviewed:    The following studies were reviewed today   Physical Exam:    VS:  BP 132/68 (BP Location: Right Arm, Patient Position: Sitting, Cuff Size: Large)   Pulse (!) 50   Temp 98.1 F (36.7 C)   Ht 5\' 2"  (1.575 m)   Wt 239 lb 12.8 oz (108.8 kg)   SpO2 94%   BMI 43.86 kg/m     Wt Readings from Last 3 Encounters:  06/06/19 239 lb 12.8 oz (108.8 kg)  04/25/19 237 lb 6.4 oz (107.7 kg)  01/24/19 237 lb (107.5 kg)     GEN:  Well nourished, well developed in no acute distress HEENT: Normal NECK: No JVD; No carotid bruits LYMPHATICS: No lymphadenopathy CARDIAC: RRR, no murmurs, rubs, gallops RESPIRATORY:  Clear to auscultation without rales,  wheezing or rhonchi  ABDOMEN: Soft, non-tender, non-distended MUSCULOSKELETAL:  No edema; No deformity  SKIN: Warm and dry NEUROLOGIC:  Alert and oriented x 3 PSYCHIATRIC:  Normal affect    Signed, Shirlee More, MD  06/06/2019 9:34 AM    [place]

## 2019-06-07 ENCOUNTER — Other Ambulatory Visit: Payer: Self-pay | Admitting: Cardiology

## 2019-06-07 LAB — BASIC METABOLIC PANEL
BUN/Creatinine Ratio: 18 (ref 12–28)
BUN: 17 mg/dL (ref 8–27)
CO2: 30 mmol/L — ABNORMAL HIGH (ref 20–29)
Calcium: 9.4 mg/dL (ref 8.7–10.3)
Chloride: 99 mmol/L (ref 96–106)
Creatinine, Ser: 0.92 mg/dL (ref 0.57–1.00)
GFR calc Af Amer: 72 mL/min/{1.73_m2} (ref >59)
GFR calc non Af Amer: 63 mL/min/{1.73_m2} (ref >59)
Glucose: 105 mg/dL — ABNORMAL HIGH (ref 65–99)
Potassium: 4.6 mmol/L (ref 3.5–5.2)
Sodium: 140 mmol/L (ref 134–144)

## 2019-06-07 LAB — CBC
Hematocrit: 39.5 % (ref 34.0–46.6)
Hemoglobin: 13.5 g/dL (ref 11.1–15.9)
MCH: 31.2 pg (ref 26.6–33.0)
MCHC: 34.2 g/dL (ref 31.5–35.7)
MCV: 91 fL (ref 79–97)
Platelets: 241 10*3/uL (ref 150–450)
RBC: 4.33 x10E6/uL (ref 3.77–5.28)
RDW: 13.3 % (ref 11.7–15.4)
WBC: 7.2 10*3/uL (ref 3.4–10.8)

## 2019-06-07 LAB — PRO B NATRIURETIC PEPTIDE: NT-Pro BNP: 434 pg/mL — ABNORMAL HIGH (ref 0–301)

## 2019-06-10 ENCOUNTER — Telehealth: Payer: Self-pay | Admitting: Cardiology

## 2019-06-10 NOTE — Telephone Encounter (Signed)
Please call patient to discuss medication she was put on.  It makes her feel bad, SOB and she is not comfortable taking it

## 2019-06-11 ENCOUNTER — Other Ambulatory Visit: Payer: Self-pay

## 2019-06-11 DIAGNOSIS — Z20822 Contact with and (suspected) exposure to covid-19: Secondary | ICD-10-CM

## 2019-06-11 DIAGNOSIS — Z20828 Contact with and (suspected) exposure to other viral communicable diseases: Secondary | ICD-10-CM | POA: Diagnosis not present

## 2019-06-11 MED ORDER — VERAPAMIL HCL 80 MG PO TABS: 40.0000 mg | ORAL_TABLET | Freq: Two times a day (BID) | ORAL | 3 refills | Status: DC

## 2019-06-11 NOTE — Telephone Encounter (Signed)
Lets try one half equal 40 mg twice daily and if she does not tolerate it I think we should leave her off suppressive medications at this time

## 2019-06-11 NOTE — Telephone Encounter (Signed)
Patient returned call and explained that she is unable to tolerate taking verapamil 80 mg twice daily started during her office visit on 06/06/2019. She reports increased shortness of breath and fatigue since starting this medication. Patient took verapmil 80 mg once in the evening yesterday and feels fine today. She is wondering if she could take 0.5 tablet (40 mg) twice daily or take 1 tablet (80 mg) daily in the evening instead of taking 1 tablet twice daily. Please advise. Thanks!

## 2019-06-11 NOTE — Telephone Encounter (Signed)
Left detailed message on patient's cell phone per DPR advising her to decrease verapamil dose to 0.5 tablet (40 mg) twice daily and contact our office with any further questions or concerns.

## 2019-06-11 NOTE — Telephone Encounter (Signed)
Left message to return call to discuss.  

## 2019-06-12 ENCOUNTER — Other Ambulatory Visit: Payer: Self-pay | Admitting: Family Medicine

## 2019-06-12 DIAGNOSIS — Z20822 Contact with and (suspected) exposure to covid-19: Secondary | ICD-10-CM

## 2019-06-13 LAB — NOVEL CORONAVIRUS, NAA: SARS-CoV-2, NAA: NOT DETECTED

## 2019-07-04 DIAGNOSIS — Z0000 Encounter for general adult medical examination without abnormal findings: Secondary | ICD-10-CM | POA: Diagnosis not present

## 2019-07-09 DIAGNOSIS — M436 Torticollis: Secondary | ICD-10-CM | POA: Diagnosis not present

## 2019-07-18 ENCOUNTER — Encounter: Payer: Self-pay | Admitting: Family

## 2019-07-18 ENCOUNTER — Other Ambulatory Visit: Payer: Self-pay | Admitting: Cardiology

## 2019-07-18 ENCOUNTER — Ambulatory Visit: Payer: PPO | Admitting: Family

## 2019-07-18 ENCOUNTER — Telehealth: Payer: Self-pay | Admitting: Family

## 2019-07-18 NOTE — Progress Notes (Deleted)
Office Visit    Patient Name: [name] Date of Encounter: 07/18/2019  Primary Care Provider:  Kathyrn Lass, MD Primary Cardiologist:  Shirlee More, MD Electrophysiologist:  None   Chief Complaint    [name] is a 73 y.o. female with a hx of PAF on anticoagulation, SVT, HTN presents today for follow up after medication changes.   Past Medical History    Past Medical History:  Diagnosis Date  . Atrial fibrillation ([place])   . Heart murmur    --innocent  . Hypertension    Past Surgical History:  Procedure Laterality Date  . ABDOMINAL HYSTERECTOMY  03/2010   TAH/BSO  . CESAREAN SECTION  1981  . 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 ROTATOR CUFF REPAIR Left 2010  . TOTAL KNEE ARTHROPLASTY Bilateral     Allergies  Allergies  Allergen Reactions  . Codeine Nausea And Vomiting  . Sulfa Antibiotics Rash    History of Present Illness    [name] is a 73 y.o. female with a hx of PAF on chronic anticoagulation, SVT, HTN last seen 06/06/19.  Her beta blocker has been decreased and subsequently discontinued due to malaise and fatigue. She was started on Verapamil 80mg  at office visit 06/06/19. This was decreased by half on 06/10/19   EKGs/Labs/Other Studies Reviewed:   The following studies were reviewed today: Long term monitor 10/22/18 A 14-day extended ZIO monitor was performed beginning 10/22/2018 to evaluate atrial arrhythmia.   Predominant rhythm is sinus with minimum average and maximum heart rates of 52, 72 and 107 bpm.   There were no pauses of 3 seconds or greater second-degree AV or sinus node exit block.   Ventricular ectopy is rare with isolated PVCs and couplets.   Supraventricular ectopy is frequent with APCs couplets and rare triplets.   There are frequent brief episodes of atrial tachycardia.   Other episodes called SVT are clearly atrial fibrillation with rapid heart rate of greater than  180 bpm.  Unfortunately these episodes are called SVT and atrial fibrillation burden cannot be assessed.   Therefore triggered events typically associated with supraventricular ectopy.     Conclusion, frequent supraventricular ectopy with frequent episodes of atrial tachycardia as well as atrial fibrillation with rapid ventricular rate.   EKG:  No EKG today.  Recent Labs: 06/06/2019: BUN 17; Creatinine, Ser 0.92; Hemoglobin 13.5; NT-Pro BNP 434; Platelets 241; Potassium 4.6; Sodium 140  Recent Lipid Panel No results found for: CHOL, TRIG, HDL, CHOLHDL, VLDL, LDLCALC, LDLDIRECT  Home Medications   No outpatient medications have been marked as taking for the 07/18/19 encounter (Appointment) with Loel Dubonnet, NP.      Review of Systems    ***   ROS All other systems reviewed and are otherwise negative except as noted above.  Physical Exam    VS:  There were no vitals taken for this visit. , BMI There is no height or weight on file to calculate BMI. GEN: Well nourished, well developed, in no acute distress. HEENT: normal. Neck: Supple, no JVD, carotid bruits, or masses. Cardiac: ***RRR, no murmurs, rubs, or gallops. No clubbing, cyanosis, edema.  ***Radials/DP/PT 2+ and equal bilaterally.  Respiratory:  ***Respirations regular and unlabored, clear to auscultation bilaterally. GI: Soft, nontender, nondistended, BS + x 4. MS: No deformity or atrophy. Skin: Warm and dry, no rash. Neuro:  Strength and sensation are intact. Psych: Normal affect.  Assessment & Plan  1. PAF -  2. Chronic anticoagulation - Secondary to PAF.  3. Hypertensive heart disease without heart failure -  4. Malaise and fatigue -   Disposition: Follow up {follow up:15908} with ***   Loel Dubonnet, NP 07/18/2019, 8:43 AM

## 2019-07-18 NOTE — Telephone Encounter (Signed)
Reports she has been taking Verapamil half tablet (40mg ) in the morning and full tablet (80mg ) in the evening. Reports tolerating well. Denies dizziness, fatigue, malaise.   She has been set up for her follow up with Dr. Bettina Gavia 09/15/18.   Loel Dubonnet, NP

## 2019-08-11 DIAGNOSIS — K047 Periapical abscess without sinus: Secondary | ICD-10-CM | POA: Diagnosis not present

## 2019-08-20 ENCOUNTER — Other Ambulatory Visit: Payer: Self-pay | Admitting: Cardiology

## 2019-09-15 NOTE — Progress Notes (Signed)
Cardiology Office Note:    Date:  09/16/2019   ID:  [name], DOB [birth year], MRN [id number]  PCP:  Erika Lass, MD  Cardiologist:  Erika More, MD    Referring MD: Erika Lass, MD    ASSESSMENT:    1. PAF (paroxysmal atrial fibrillation) ([place])   2. Chronic anticoagulation   3. Hypertensive heart disease without heart failure    PLAN:    In order of problems listed above:  1. Stable symptomatically improved continue her calcium channel blocker anticoagulant. 2. Continue anticoagulation 3. BP at target continue combination including ARB thiazide diuretic   Next appointment: 6 months   Medication Adjustments/Labs and Tests Ordered: Current medicines are reviewed at length with the patient today.  Concerns regarding medicines are outlined above.  No orders of the defined types were placed in this encounter.  Meds ordered this encounter  Medications  . furosemide (LASIX) 20 MG tablet    Sig: Take 1 tablet (20 mg total) by mouth 2 (two) times a week.    Dispense:  30 tablet    Refill:  0  . apixaban (ELIQUIS) 5 MG TABS tablet    Sig: Take 1 tablet (5 mg total) by mouth 2 (two) times daily.    Dispense:  180 tablet    Refill:  0    Chief Complaint  Patient presents with  . Atrial Fibrillation    History of Present Illness:    [name] is a 74 y.o. female with a hx of paroxysmal atrial fibrillation on anticoagulation, SVT, HTN  and bradycardia requiring adjustment of beta-blocker dose last seen 06/06/2019.  Planes of malaise and fatigue N-terminal proBNP level was checked and not found to be in heart failure range of 434.  [name] was improved with normalization of hemoglobin to 13.5. Compliance with diet, lifestyle and medications: Yes  She is improved off of a beta-blocker and now takes one half of verapamil 40 mg in the morning 180 mg in the evening without side effects and good heart rate control no edema shortness of breath chest pain palpitation  or syncope.  She tolerates her anticoagulant without bleeding complication.  She was placed on a statin had muscle weakness and stopped Past Medical History:  Diagnosis Date  . Atrial fibrillation ([place])   . Heart murmur    --innocent  . Hypertension     Past Surgical History:  Procedure Laterality Date  . ABDOMINAL HYSTERECTOMY  03/2010   TAH/BSO  . CESAREAN SECTION  1981  . 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 ROTATOR CUFF REPAIR Left 2010  . TOTAL KNEE ARTHROPLASTY Bilateral     Current Medications: Current Meds  Medication Sig  . albuterol (PROAIR HFA) 108 (90 Base) MCG/ACT inhaler ProAir HFA 90 mcg/actuation aerosol inhaler  INHALE 1 TO 2 PUFFS EVERY 4 TO 6 HOURS AS NEEDED 30 DAYS  . apixaban (ELIQUIS) 5 MG TABS tablet Take 1 tablet (5 mg total) by mouth 2 (two) times daily.  Marland Kitchen losartan-hydrochlorothiazide (HYZAAR) 50-12.5 MG tablet Take 1 tablet by mouth daily.  . pantoprazole (PROTONIX) 40 MG tablet Take 40 mg by mouth daily as needed.  . verapamil (CALAN) 80 MG tablet TAKE 1 TABLET (80 MG TOTAL) BY MOUTH 2 (TWO) TIMES DAILY.  Marland Kitchen Vitamin D, Ergocalciferol, (DRISDOL) 1.25 MG (50000 UT) CAPS capsule TAKE 1 CAPSULE BY MOUTH 2 TIMES A WEEK  . [DISCONTINUED] ELIQUIS 5 MG TABS tablet TAKE 1 TABLET BY MOUTH  TWICE A DAY  . [DISCONTINUED] furosemide (LASIX) 20 MG tablet TAKE 1 TABLET (20 MG TOTAL) BY MOUTH 2 (TWO) TIMES A WEEK.     Allergies:   Codeine and Sulfa antibiotics   Social History   Socioeconomic History  . Marital status: Divorced    Spouse name: Not on file  . Number of children: Not on file  . Years of education: Not on file  . Highest education level: Not on file  Occupational History  . Not on file  Tobacco Use  . Smoking status: Never Smoker  . Smokeless tobacco: Never Used  Substance and Sexual Activity  . Alcohol use: Yes    Comment: occ wine  . Drug use: Never  . Sexual activity: Never    Partners: Male     Birth control/protection: Surgical    Comment: TAH/BSO  Other Topics Concern  . Not on file  Social History Narrative  . Not on file   Social Determinants of Health   Financial Resource Strain:   . Difficulty of Paying Living Expenses: Not on file  Food Insecurity:   . Worried About Charity fundraiser in the Last Year: Not on file  . Ran Out of Food in the Last Year: Not on file  Transportation Needs:   . Lack of Transportation (Medical): Not on file  . Lack of Transportation (Non-Medical): Not on file  Physical Activity:   . Days of Exercise per Week: Not on file  . Minutes of Exercise per Session: Not on file  Stress:   . Feeling of Stress : Not on file  Social Connections:   . Frequency of Communication with Friends and Family: Not on file  . Frequency of Social Gatherings with Friends and Family: Not on file  . Attends Religious Services: Not on file  . Active Member of Clubs or Organizations: Not on file  . Attends Archivist Meetings: Not on file  . Marital Status: Not on file     Family History: The patient's family history includes Breast cancer in her mother, paternal aunt, and paternal aunt; Heart attack in her maternal grandfather; Stroke in her paternal aunt and another family member. ROS:   Please see the history of present illness.    All other systems reviewed and are negative.  EKGs/Labs/Other Studies Reviewed:    The following studies were reviewed today:  EKG:  EKG ordered today and personally reviewed.  The ekg ordered today demonstrates rate controlled atrial fibrillation  Recent Labs: 06/06/2019: BUN 17; Creatinine, Ser 0.92; Hemoglobin 13.5; NT-Pro BNP 434; Platelets 241; Potassium 4.6; Sodium 140  Recent Lipid Panel No results found for: CHOL, TRIG, HDL, CHOLHDL, VLDL, LDLCALC, LDLDIRECT  Physical Exam:    VS:  BP 138/70 (BP Location: Left Arm, Patient Position: Sitting, Cuff Size: Large)   Pulse (!) 104   Ht 5\' 2"  (1.575 m)   Wt  241 lb (109.3 kg)   SpO2 95%   BMI 44.08 kg/m     Wt Readings from Last 3 Encounters:  09/16/19 241 lb (109.3 kg)  06/06/19 239 lb 12.8 oz (108.8 kg)  04/25/19 237 lb 6.4 oz (107.7 kg)     GEN:  Well nourished, well developed in no acute distress HEENT: Normal NECK: No JVD; No carotid bruits LYMPHATICS: No lymphadenopathy CARDIAC: Irregular S1 variable, no rubs, gallops RESPIRATORY:  Clear to auscultation without rales, wheezing or rhonchi  ABDOMEN: Soft, non-tender, non-distended MUSCULOSKELETAL:  No edema; No deformity  SKIN:  Warm and dry NEUROLOGIC:  Alert and oriented x 3 PSYCHIATRIC:  Normal affect    Signed, Erika More, MD  09/16/2019 2:56 PM    [place]

## 2019-09-16 ENCOUNTER — Other Ambulatory Visit: Payer: Self-pay

## 2019-09-16 ENCOUNTER — Encounter: Payer: Self-pay | Admitting: Cardiology

## 2019-09-16 ENCOUNTER — Ambulatory Visit (INDEPENDENT_AMBULATORY_CARE_PROVIDER_SITE_OTHER): Payer: PPO | Admitting: Cardiology

## 2019-09-16 VITALS — BP 138/70 | HR 104 | Ht 62.0 in | Wt 241.0 lb

## 2019-09-16 DIAGNOSIS — Z7901 Long term (current) use of anticoagulants: Secondary | ICD-10-CM | POA: Diagnosis not present

## 2019-09-16 DIAGNOSIS — I4819 Other persistent atrial fibrillation: Secondary | ICD-10-CM

## 2019-09-16 DIAGNOSIS — I119 Hypertensive heart disease without heart failure: Secondary | ICD-10-CM | POA: Diagnosis not present

## 2019-09-16 MED ORDER — APIXABAN 5 MG PO TABS: 5.0000 mg | ORAL_TABLET | Freq: Two times a day (BID) | ORAL | 0 refills | Status: DC

## 2019-09-16 MED ORDER — FUROSEMIDE 20 MG PO TABS: 20.0000 mg | ORAL_TABLET | ORAL | 0 refills | Status: DC

## 2019-09-16 NOTE — Patient Instructions (Signed)
Medication Instructions:  None  *If you need a refill on your cardiac medications before your next appointment, please call your pharmacy*  Lab Work: None  If you have labs (blood work) drawn today and your tests are completely normal, you will receive your results only by: . MyChart Message (if you have MyChart) OR . A paper copy in the mail If you have any lab test that is abnormal or we need to change your treatment, we will call you to review the results.  Testing/Procedures: None  Follow-Up: At [facility], you and your health needs are our priority.  As part of our continuing mission to provide you with exceptional heart care, we have created designated Provider Care Teams.  These Care Teams include your primary Cardiologist (physician) and Advanced Practice Providers (APPs -  Physician Assistants and Nurse Practitioners) who all work together to provide you with the care you need, when you need it.  Your next appointment:   6 month(s)  The format for your next appointment:   In Person  Provider:   Brian Munley, MD  Other Instructions None   

## 2019-09-17 NOTE — Addendum Note (Signed)
Addended by: Amado Coe on: 09/17/2019 09:03 AM   Modules accepted: Orders

## 2019-10-17 ENCOUNTER — Ambulatory Visit: Payer: PPO | Attending: Internal Medicine

## 2019-10-17 DIAGNOSIS — Z20822 Contact with and (suspected) exposure to covid-19: Secondary | ICD-10-CM

## 2019-10-18 DIAGNOSIS — L821 Other seborrheic keratosis: Secondary | ICD-10-CM | POA: Diagnosis not present

## 2019-10-18 LAB — NOVEL CORONAVIRUS, NAA: SARS-CoV-2, NAA: DETECTED — AB

## 2019-10-19 ENCOUNTER — Other Ambulatory Visit: Payer: Self-pay | Admitting: Internal Medicine

## 2019-10-19 DIAGNOSIS — I119 Hypertensive heart disease without heart failure: Secondary | ICD-10-CM

## 2019-10-19 DIAGNOSIS — U071 COVID-19: Secondary | ICD-10-CM

## 2019-10-19 NOTE — Progress Notes (Signed)
  I connected by phone with [name] on 10/19/2019 at 9:39 AM to discuss the potential use of an new treatment for mild to moderate COVID-19 viral infection in non-hospitalized patients.  This patient is a 74 y.o. female that meets the FDA criteria for Emergency Use Authorization of bamlanivimab or casirivimab\imdevimab.  Has a (+) direct SARS-CoV-2 viral test result  Has mild or moderate COVID-19   Is ? 72 years of age and weighs ? 40 kg  Is NOT hospitalized due to COVID-19  Is NOT requiring oxygen therapy or requiring an increase in baseline oxygen flow rate due to COVID-19  Is within 10 days of symptom onset  Has at least one of the high risk factor(s) for progression to severe COVID-19 and/or hospitalization as defined in EUA.  Specific high risk criteria : >/= 72 yo with hx htn and BMI >35   I have spoken and communicated the following to the patient or parent/caregiver:  1. FDA has authorized the emergency use of bamlanivimab and casirivimab\imdevimab for the treatment of mild to moderate COVID-19 in adults and pediatric patients with positive results of direct SARS-CoV-2 viral testing who are 105 years of age and older weighing at least 40 kg, and who are at high risk for progressing to severe COVID-19 and/or hospitalization.  2. The significant known and potential risks and benefits of bamlanivimab and casirivimab\imdevimab, and the extent to which such potential risks and benefits are unknown.  3. Information on available alternative treatments and the risks and benefits of those alternatives, including clinical trials.  4. Patients treated with bamlanivimab and casirivimab\imdevimab should continue to self-isolate and use infection control measures (e.g., wear mask, isolate, social distance, avoid sharing personal items, clean and disinfect "high touch" surfaces, and frequent handwashing) according to CDC guidelines.   5. The patient or parent/caregiver has the option to  accept or refuse bamlanivimab or casirivimab\imdevimab .  After reviewing this information with the patient, The patient agreed to proceed with receiving the bamlanimivab infusion and will be provided a copy of the Fact sheet prior to receiving the infusion.Alan Ripper, NP-C [place]

## 2019-10-20 ENCOUNTER — Encounter (HOSPITAL_COMMUNITY): Payer: Self-pay

## 2019-10-20 ENCOUNTER — Ambulatory Visit (HOSPITAL_COMMUNITY)
Admission: RE | Admit: 2019-10-20 | Discharge: 2019-10-20 | Disposition: A | Discharge status: Home / Self Care | Payer: Medicare Other | Source: Ambulatory Visit | Attending: Pulmonary Disease | Admitting: Pulmonary Disease

## 2019-10-20 DIAGNOSIS — U071 COVID-19: Secondary | ICD-10-CM

## 2019-10-20 DIAGNOSIS — Z23 Encounter for immunization: Secondary | ICD-10-CM | POA: Diagnosis not present

## 2019-10-20 DIAGNOSIS — I119 Hypertensive heart disease without heart failure: Secondary | ICD-10-CM | POA: Diagnosis present

## 2019-10-20 MED ORDER — EPINEPHRINE 0.3 MG/0.3ML IJ SOAJ: 0.3000 mg | Freq: Once | INTRAMUSCULAR | Status: DC | PRN

## 2019-10-20 MED ORDER — METHYLPREDNISOLONE SODIUM SUCC 125 MG IJ SOLR: 125.0000 mg | Freq: Once | INTRAMUSCULAR | Status: DC | PRN

## 2019-10-20 MED ORDER — DIPHENHYDRAMINE HCL 50 MG/ML IJ SOLN: 50.0000 mg | Freq: Once | INTRAMUSCULAR | Status: DC | PRN

## 2019-10-20 MED ORDER — SODIUM CHLORIDE 0.9 % IV SOLN
700.0000 mg | Freq: Once | INTRAVENOUS | Status: AC
  Administered 2019-10-20: 11:00:00 700 mg via INTRAVENOUS
  Filled 2019-10-20: qty 20

## 2019-10-20 MED ORDER — SODIUM CHLORIDE 0.9 % IV SOLN
INTRAVENOUS | Status: DC | PRN
  Administered 2019-10-20: 250 mL via INTRAVENOUS

## 2019-10-20 MED ORDER — ALBUTEROL SULFATE HFA 108 (90 BASE) MCG/ACT IN AERS: 2.0000 | INHALATION_SPRAY | Freq: Once | RESPIRATORY_TRACT | Status: DC | PRN

## 2019-10-20 MED ORDER — FAMOTIDINE IN NACL 20-0.9 MG/50ML-% IV SOLN: 20.0000 mg | Freq: Once | INTRAVENOUS | Status: DC | PRN

## 2019-10-20 NOTE — Discharge Instructions (Signed)

## 2019-10-20 NOTE — Progress Notes (Signed)
  Diagnosis: COVID-19  Physician:Dr. Joya Gaskins  Procedure: [facility] Med: bamlanivimab infusion - Provided patient with bamlanimivab fact sheet for patients, parents and caregivers prior to infusion.  Complications: No immediate complications noted.  Discharge: Discharged home   Claudia Desanctis 10/20/2019

## 2019-10-31 ENCOUNTER — Telehealth: Payer: Self-pay | Admitting: Cardiology

## 2019-10-31 NOTE — Telephone Encounter (Signed)
Request that her last EKG be faxed to her job: [employer] in [place]. Advised that this request would go to our medical records department. Verbalized understanding. Request faxed to [address] [facility] [phone].

## 2019-10-31 NOTE — Telephone Encounter (Signed)
New Message    Pt is calling and would like for last EKG to be faxed to her  Fax # [phone]

## 2019-11-08 DIAGNOSIS — Z6841 Body Mass Index (BMI) 40.0 and over, adult: Secondary | ICD-10-CM | POA: Diagnosis not present

## 2019-11-08 DIAGNOSIS — R6882 Decreased libido: Secondary | ICD-10-CM | POA: Diagnosis not present

## 2019-11-08 DIAGNOSIS — E559 Vitamin D deficiency, unspecified: Secondary | ICD-10-CM | POA: Diagnosis not present

## 2019-11-08 DIAGNOSIS — N958 Other specified menopausal and perimenopausal disorders: Secondary | ICD-10-CM | POA: Diagnosis not present

## 2019-11-08 DIAGNOSIS — K219 Gastro-esophageal reflux disease without esophagitis: Secondary | ICD-10-CM | POA: Diagnosis not present

## 2019-11-08 DIAGNOSIS — I10 Essential (primary) hypertension: Secondary | ICD-10-CM | POA: Diagnosis not present

## 2019-11-08 DIAGNOSIS — Z1339 Encounter for screening examination for other mental health and behavioral disorders: Secondary | ICD-10-CM | POA: Diagnosis not present

## 2019-11-08 DIAGNOSIS — G479 Sleep disorder, unspecified: Secondary | ICD-10-CM | POA: Diagnosis not present

## 2019-11-08 DIAGNOSIS — Z1331 Encounter for screening for depression: Secondary | ICD-10-CM | POA: Diagnosis not present

## 2019-11-22 DIAGNOSIS — Z6838 Body mass index (BMI) 38.0-38.9, adult: Secondary | ICD-10-CM | POA: Diagnosis not present

## 2019-11-22 DIAGNOSIS — I10 Essential (primary) hypertension: Secondary | ICD-10-CM | POA: Diagnosis not present

## 2019-12-06 DIAGNOSIS — I10 Essential (primary) hypertension: Secondary | ICD-10-CM | POA: Diagnosis not present

## 2019-12-06 DIAGNOSIS — R6882 Decreased libido: Secondary | ICD-10-CM | POA: Diagnosis not present

## 2019-12-06 DIAGNOSIS — Z6841 Body Mass Index (BMI) 40.0 and over, adult: Secondary | ICD-10-CM | POA: Diagnosis not present

## 2019-12-06 DIAGNOSIS — N958 Other specified menopausal and perimenopausal disorders: Secondary | ICD-10-CM | POA: Diagnosis not present

## 2019-12-06 DIAGNOSIS — G479 Sleep disorder, unspecified: Secondary | ICD-10-CM | POA: Diagnosis not present

## 2019-12-11 ENCOUNTER — Other Ambulatory Visit: Payer: Self-pay | Admitting: Family Medicine

## 2019-12-11 DIAGNOSIS — Z1231 Encounter for screening mammogram for malignant neoplasm of breast: Secondary | ICD-10-CM

## 2019-12-12 ENCOUNTER — Other Ambulatory Visit: Payer: Self-pay

## 2019-12-12 ENCOUNTER — Ambulatory Visit
Admission: RE | Admit: 2019-12-12 | Discharge: 2019-12-12 | Disposition: A | Discharge status: Home / Self Care | Payer: PPO | Source: Ambulatory Visit | Attending: Family Medicine | Admitting: Family Medicine

## 2019-12-12 DIAGNOSIS — Z1231 Encounter for screening mammogram for malignant neoplasm of breast: Secondary | ICD-10-CM | POA: Diagnosis not present

## 2019-12-13 DIAGNOSIS — N951 Menopausal and female climacteric states: Secondary | ICD-10-CM | POA: Diagnosis not present

## 2019-12-13 DIAGNOSIS — G479 Sleep disorder, unspecified: Secondary | ICD-10-CM | POA: Diagnosis not present

## 2019-12-17 ENCOUNTER — Other Ambulatory Visit: Payer: Self-pay | Admitting: Cardiology

## 2019-12-17 NOTE — Telephone Encounter (Signed)
LOV was 09/16/2019 with [facility] and no upcoming appt. Scheduled.

## 2020-01-03 DIAGNOSIS — H02831 Dermatochalasis of right upper eyelid: Secondary | ICD-10-CM | POA: Diagnosis not present

## 2020-01-03 DIAGNOSIS — H25013 Cortical age-related cataract, bilateral: Secondary | ICD-10-CM | POA: Diagnosis not present

## 2020-01-03 DIAGNOSIS — H5203 Hypermetropia, bilateral: Secondary | ICD-10-CM | POA: Diagnosis not present

## 2020-01-03 DIAGNOSIS — H35371 Puckering of macula, right eye: Secondary | ICD-10-CM | POA: Diagnosis not present

## 2020-01-10 DIAGNOSIS — Z6841 Body Mass Index (BMI) 40.0 and over, adult: Secondary | ICD-10-CM | POA: Diagnosis not present

## 2020-01-10 DIAGNOSIS — D1801 Hemangioma of skin and subcutaneous tissue: Secondary | ICD-10-CM | POA: Diagnosis not present

## 2020-01-10 DIAGNOSIS — Z86018 Personal history of other benign neoplasm: Secondary | ICD-10-CM | POA: Diagnosis not present

## 2020-01-10 DIAGNOSIS — I10 Essential (primary) hypertension: Secondary | ICD-10-CM | POA: Diagnosis not present

## 2020-01-10 DIAGNOSIS — L814 Other melanin hyperpigmentation: Secondary | ICD-10-CM | POA: Diagnosis not present

## 2020-01-10 DIAGNOSIS — L309 Dermatitis, unspecified: Secondary | ICD-10-CM | POA: Diagnosis not present

## 2020-01-10 DIAGNOSIS — L281 Prurigo nodularis: Secondary | ICD-10-CM | POA: Diagnosis not present

## 2020-01-10 DIAGNOSIS — L578 Other skin changes due to chronic exposure to nonionizing radiation: Secondary | ICD-10-CM | POA: Diagnosis not present

## 2020-01-10 DIAGNOSIS — L821 Other seborrheic keratosis: Secondary | ICD-10-CM | POA: Diagnosis not present

## 2020-01-31 DIAGNOSIS — H02132 Senile ectropion of right lower eyelid: Secondary | ICD-10-CM | POA: Diagnosis not present

## 2020-01-31 DIAGNOSIS — H02423 Myogenic ptosis of bilateral eyelids: Secondary | ICD-10-CM | POA: Diagnosis not present

## 2020-01-31 DIAGNOSIS — H57813 Brow ptosis, bilateral: Secondary | ICD-10-CM | POA: Diagnosis not present

## 2020-01-31 DIAGNOSIS — I10 Essential (primary) hypertension: Secondary | ICD-10-CM | POA: Insufficient documentation

## 2020-01-31 DIAGNOSIS — I4891 Unspecified atrial fibrillation: Secondary | ICD-10-CM | POA: Insufficient documentation

## 2020-01-31 DIAGNOSIS — L918 Other hypertrophic disorders of the skin: Secondary | ICD-10-CM | POA: Diagnosis not present

## 2020-01-31 DIAGNOSIS — H0279 Other degenerative disorders of eyelid and periocular area: Secondary | ICD-10-CM | POA: Diagnosis not present

## 2020-01-31 DIAGNOSIS — H02831 Dermatochalasis of right upper eyelid: Secondary | ICD-10-CM | POA: Diagnosis not present

## 2020-01-31 DIAGNOSIS — H02834 Dermatochalasis of left upper eyelid: Secondary | ICD-10-CM | POA: Diagnosis not present

## 2020-01-31 DIAGNOSIS — H53483 Generalized contraction of visual field, bilateral: Secondary | ICD-10-CM | POA: Diagnosis not present

## 2020-01-31 DIAGNOSIS — H02413 Mechanical ptosis of bilateral eyelids: Secondary | ICD-10-CM | POA: Diagnosis not present

## 2020-02-14 DIAGNOSIS — N951 Menopausal and female climacteric states: Secondary | ICD-10-CM | POA: Diagnosis not present

## 2020-02-14 DIAGNOSIS — E039 Hypothyroidism, unspecified: Secondary | ICD-10-CM | POA: Diagnosis not present

## 2020-02-21 DIAGNOSIS — H53483 Generalized contraction of visual field, bilateral: Secondary | ICD-10-CM | POA: Diagnosis not present

## 2020-02-21 DIAGNOSIS — N951 Menopausal and female climacteric states: Secondary | ICD-10-CM | POA: Diagnosis not present

## 2020-02-21 DIAGNOSIS — G479 Sleep disorder, unspecified: Secondary | ICD-10-CM | POA: Diagnosis not present

## 2020-04-01 ENCOUNTER — Other Ambulatory Visit: Payer: Self-pay

## 2020-04-01 MED ORDER — APIXABAN 5 MG PO TABS: 5.0000 mg | ORAL_TABLET | Freq: Two times a day (BID) | ORAL | 1 refills | Status: DC

## 2020-04-16 NOTE — Progress Notes (Signed)
Cardiology Office Note:    Date:  04/17/2020   ID:  [name], DOB 1946-03-31, MRN [id number]  PCP:  Kathyrn Lass, MD  Cardiologist:  Shirlee More, MD    Referring MD: Kathyrn Lass, MD    ASSESSMENT:    1. PAF (paroxysmal atrial fibrillation) ([place])   2. Hypertensive heart disease without heart failure   3. Chronic anticoagulation    PLAN:    In order of problems listed above:  1. Stable maintaining sinus rhythm continue her current suppression with rate limiting calcium channel blocker and anticoagulation. 2. BP at target continue current antihypertensive as needed diuretic   Next appointment: 6 months   Medication Adjustments/Labs and Tests Ordered: Current medicines are reviewed at length with the patient today.  Concerns regarding medicines are outlined above.  Orders Placed This Encounter  Procedures  . Comprehensive metabolic panel  . Lipid panel  . EKG 12-Lead   No orders of the defined types were placed in this encounter.   Chief Complaint  Patient presents with  . Follow-up    History of Present Illness:    [name] is a 74 y.o. female with a hx of paroxysmal atrial fibrillation on anticoagulation, SVT, HTN and bradycardia requiring adjustment of beta-blocker dose  last seen 09/16/2019. Compliance with diet, lifestyle and medications: Yes  Last seen she is in persistent atrial fibrillation appears to be in regular rhythm today we will do an EKG. Tolerates anticoagulant without bleeding complication no rapid heart rhythms palpitation chest pain shortness of breath and rarely takes a diuretic for peripheral edema. Past Medical History:  Diagnosis Date  . Atrial fibrillation ([place])   . Heart murmur    --innocent  . Hypertension     Past Surgical History:  Procedure Laterality Date  . ABDOMINAL HYSTERECTOMY  03/2010   TAH/BSO  . CESAREAN SECTION  1981  . 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  ROTATOR CUFF REPAIR Left 2010  . TOTAL KNEE ARTHROPLASTY Bilateral     Current Medications: Current Meds  Medication Sig  . albuterol (PROAIR HFA) 108 (90 Base) MCG/ACT inhaler ProAir HFA 90 mcg/actuation aerosol inhaler  INHALE 1 TO 2 PUFFS EVERY 4 TO 6 HOURS AS NEEDED 30 DAYS  . apixaban (ELIQUIS) 5 MG TABS tablet Take 1 tablet (5 mg total) by mouth 2 (two) times daily.  . furosemide (LASIX) 20 MG tablet Take 1 tablet (20 mg total) by mouth 2 (two) times a week.  . losartan-hydrochlorothiazide (HYZAAR) 50-12.5 MG tablet Take 1 tablet by mouth daily.  . pantoprazole (PROTONIX) 40 MG tablet Take 40 mg by mouth daily as needed.  . verapamil (CALAN) 80 MG tablet TAKE 1 TABLET (80 MG TOTAL) BY MOUTH 2 (TWO) TIMES DAILY.  Marland Kitchen Vitamin D, Ergocalciferol, (DRISDOL) 1.25 MG (50000 UT) CAPS capsule TAKE 1 CAPSULE BY MOUTH 2 TIMES A WEEK     Allergies:   Codeine and Sulfa antibiotics   Social History   Socioeconomic History  . Marital status: Divorced    Spouse name: Not on file  . Number of children: Not on file  . Years of education: Not on file  . Highest education level: Not on file  Occupational History  . Not on file  Tobacco Use  . Smoking status: Never Smoker  . Smokeless tobacco: Never Used  Vaping Use  . Vaping Use: Never used  Substance and Sexual Activity  . Alcohol use: Yes    Comment:  occ wine  . Drug use: Never  . Sexual activity: Never    Partners: Male    Birth control/protection: Surgical    Comment: TAH/BSO  Other Topics Concern  . Not on file  Social History Narrative  . Not on file   Social Determinants of Health   Financial Resource Strain:   . Difficulty of Paying Living Expenses:   Food Insecurity:   . Worried About Charity fundraiser in the Last Year:   . Arboriculturist in the Last Year:   Transportation Needs:   . Film/video editor (Medical):   Marland Kitchen Lack of Transportation (Non-Medical):   Physical Activity:   . Days of Exercise per Week:     . Minutes of Exercise per Session:   Stress:   . Feeling of Stress :   Social Connections:   . Frequency of Communication with Friends and Family:   . Frequency of Social Gatherings with Friends and Family:   . Attends Religious Services:   . Active Member of Clubs or Organizations:   . Attends Archivist Meetings:   Marland Kitchen Marital Status:      Family History: The patient's family history includes Breast cancer in her mother, paternal aunt, and paternal aunt; Heart attack in her maternal grandfather; Stroke in her paternal aunt and another family member. ROS:   Please see the history of present illness.    All other systems reviewed and are negative.  EKGs/Labs/Other Studies Reviewed:    The following studies were reviewed today:  EKG:  EKG ordered today and personally reviewed.  The ekg ordered today demonstrates sinus rhythm and is normal  Recent Labs: 06/06/2019: BUN 17; Creatinine, Ser 0.92; Hemoglobin 13.5; NT-Pro BNP 434; Platelets 241; Potassium 4.6; Sodium 140  Recent Lipid Panel No results found for: CHOL, TRIG, HDL, CHOLHDL, VLDL, LDLCALC, LDLDIRECT  Physical Exam:    VS:  BP 110/74 (BP Location: Left Arm, Patient Position: Sitting, Cuff Size: Normal)   Pulse 70   Ht 5\' 2"  (1.575 m)   Wt 231 lb (104.8 kg)   SpO2 93%   BMI 42.25 kg/m     Wt Readings from Last 3 Encounters:  04/17/20 231 lb (104.8 kg)  09/16/19 241 lb (109.3 kg)  06/06/19 239 lb 12.8 oz (108.8 kg)     GEN:  Well nourished, well developed in no acute distress HEENT: Normal NECK: No JVD; No carotid bruits LYMPHATICS: No lymphadenopathy CARDIAC: RRR, no murmurs, rubs, gallops RESPIRATORY:  Clear to auscultation without rales, wheezing or rhonchi  ABDOMEN: Soft, non-tender, non-distended MUSCULOSKELETAL:  No edema; No deformity  SKIN: Warm and dry NEUROLOGIC:  Alert and oriented x 3 PSYCHIATRIC:  Normal affect    Signed, Shirlee More, MD  04/17/2020 9:50 AM    [place]

## 2020-04-17 ENCOUNTER — Other Ambulatory Visit: Payer: Self-pay

## 2020-04-17 ENCOUNTER — Encounter: Payer: Self-pay | Admitting: Cardiology

## 2020-04-17 ENCOUNTER — Ambulatory Visit: Payer: PPO | Admitting: Cardiology

## 2020-04-17 VITALS — BP 110/74 | HR 70 | Ht 62.0 in | Wt 231.0 lb

## 2020-04-17 DIAGNOSIS — Z7901 Long term (current) use of anticoagulants: Secondary | ICD-10-CM | POA: Diagnosis not present

## 2020-04-17 DIAGNOSIS — I480 Paroxysmal atrial fibrillation: Secondary | ICD-10-CM | POA: Diagnosis not present

## 2020-04-17 DIAGNOSIS — I119 Hypertensive heart disease without heart failure: Secondary | ICD-10-CM

## 2020-04-17 MED ORDER — VERAPAMIL HCL 80 MG PO TABS: ORAL_TABLET | ORAL | 1 refills | Status: DC

## 2020-04-17 MED ORDER — FUROSEMIDE 20 MG PO TABS: 20.0000 mg | ORAL_TABLET | ORAL | 3 refills | Status: DC

## 2020-04-17 MED ORDER — APIXABAN 5 MG PO TABS: 5.0000 mg | ORAL_TABLET | Freq: Two times a day (BID) | ORAL | 3 refills | Status: DC

## 2020-04-17 MED ORDER — LOSARTAN POTASSIUM-HCTZ 50-12.5 MG PO TABS: 1.0000 | ORAL_TABLET | Freq: Every day | ORAL | 3 refills | Status: DC

## 2020-04-17 NOTE — Patient Instructions (Signed)

## 2020-05-25 ENCOUNTER — Telehealth: Payer: Self-pay

## 2020-05-25 NOTE — Telephone Encounter (Signed)
   [facility] Pre-operative Risk Assessment    HEARTCARE STAFF: - Please ensure there is not already an duplicate clearance open for this procedure. - Under Visit Info/Reason for Call, type in Other and utilize the format Clearance MM/DD/YY or Clearance TBD. Do not use dashes or single digits. - If request is for dental extraction, please clarify the # of teeth to be extracted.  Request for surgical clearance:  1. What type of surgery is being performed? Bilateral Upper Eyelid Blepharoplasty   2. When is this surgery scheduled? 06-15-20   3. What type of clearance is required (medical clearance vs. Pharmacy clearance to hold med vs. Both)? Pharmacy  4. Are there any medications that need to be held prior to surgery and how long? Eliquis   5. Practice name and name of physician performing surgery? [facility]- Dr. Isidoro Donning   6. What is the office phone number? [phone]   7.   What is the office fax number? [phone]  8.   Anesthesia type (None, local, MAC, general) ? MAC   [name] 05/25/2020, 4:35 PM  _________________________________________________________________   (provider comments below)

## 2020-05-26 NOTE — Telephone Encounter (Signed)
   Primary Cardiologist: Shirlee More, MD  Chart reviewed and patient contacted by phone today as part of pre-operative protocol coverage. Given past medical history and time since last visit, based on ACC/AHA guidelines, JAYCI ELLEFSON would be at acceptable risk for the planned procedure without further cardiovascular testing.   Ok to hold Eliquis two days pre op and resume as soon as possible post op.  The patient was advised that if she develops new symptoms prior to surgery to contact our office to arrange for a follow-up visit, and she verbalized understanding.  I will route this recommendation to the requesting party via Epic fax function and remove from pre-op pool.  Please call with questions.  Kerin Ransom, PA-C 05/26/2020, 2:39 PM

## 2020-05-26 NOTE — Telephone Encounter (Signed)
Patient with diagnosis of afib on Eliquis for anticoagulation.    Procedure: bilateral upper eyelid blepharoplasty Date of procedure: 06/15/20  CHADS2-VASc score of 3 (age, sex, HTN)  CrCl 8mL/min using adjusted body weight Platelet count 241K  Per office protocol, patient can hold Eliquis for 2 days prior to procedure.

## 2020-05-26 NOTE — Telephone Encounter (Signed)
Pharmacy pleaser comment on anticoagulation and then I will contact the patient for clearance.  Kerin Ransom PA-C 05/26/2020 1:22 PM

## 2020-06-11 ENCOUNTER — Other Ambulatory Visit: Payer: PPO

## 2020-06-11 DIAGNOSIS — Z20822 Contact with and (suspected) exposure to covid-19: Secondary | ICD-10-CM

## 2020-06-11 IMAGING — CR DG ABDOMEN ACUTE W/ 1V CHEST
4 series · 4 of 4 positions shown · non-contrast
Comparison: No prior abdominal imaging. Chest x-rays 02/05/2018 and
earlier.

CLINICAL DATA: 73-year-old who underwent colonoscopy and endoscopy
two days ago, complaining of low-grade fever and mild headache
since.

EXAM:
DG ABDOMEN ACUTE W/ 1V CHEST

[chest pa]
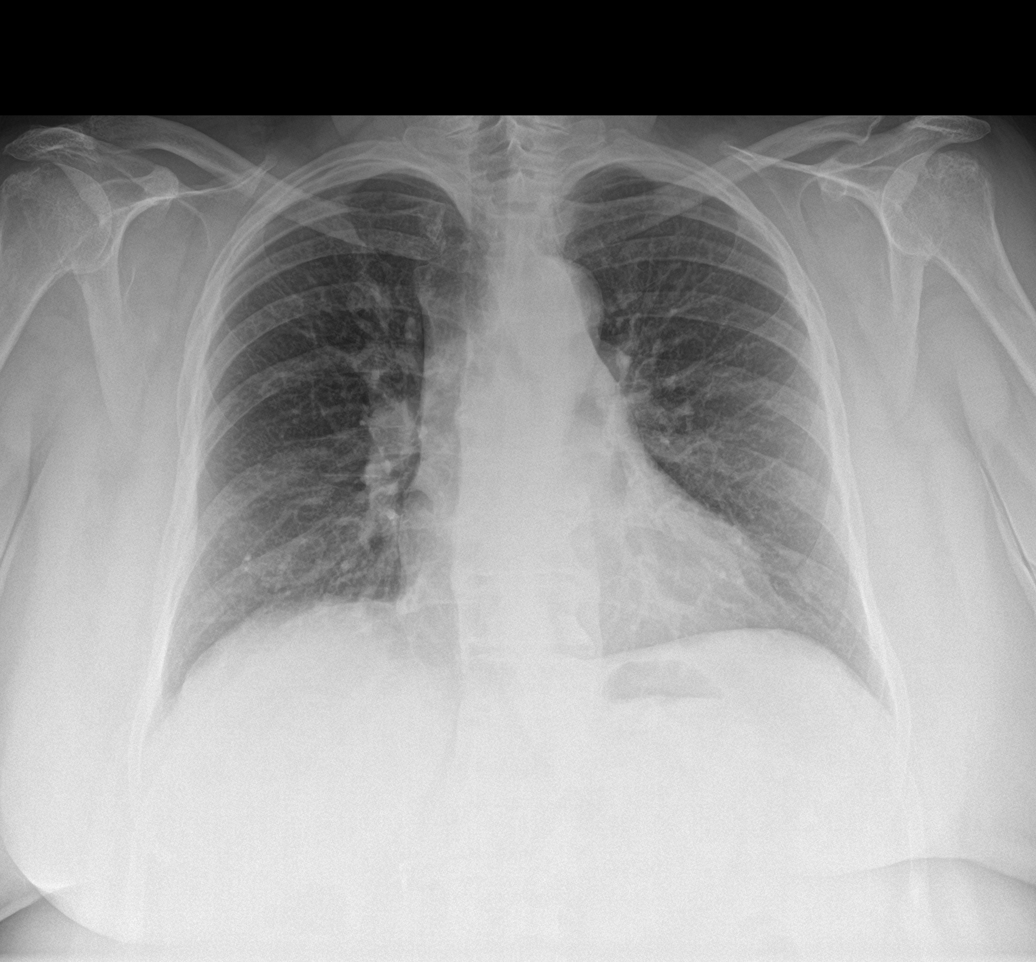

[abdomen erect]
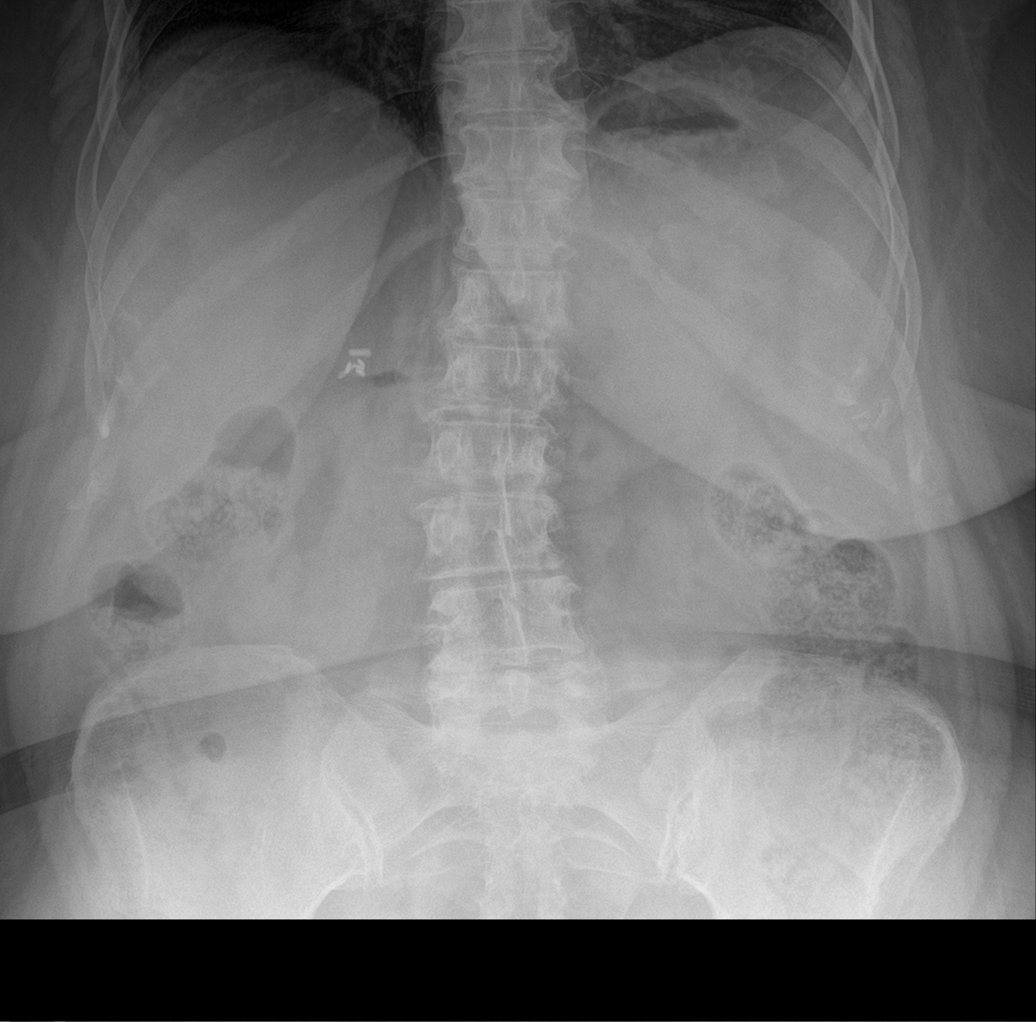

[abdomen supine (1 of 2)]
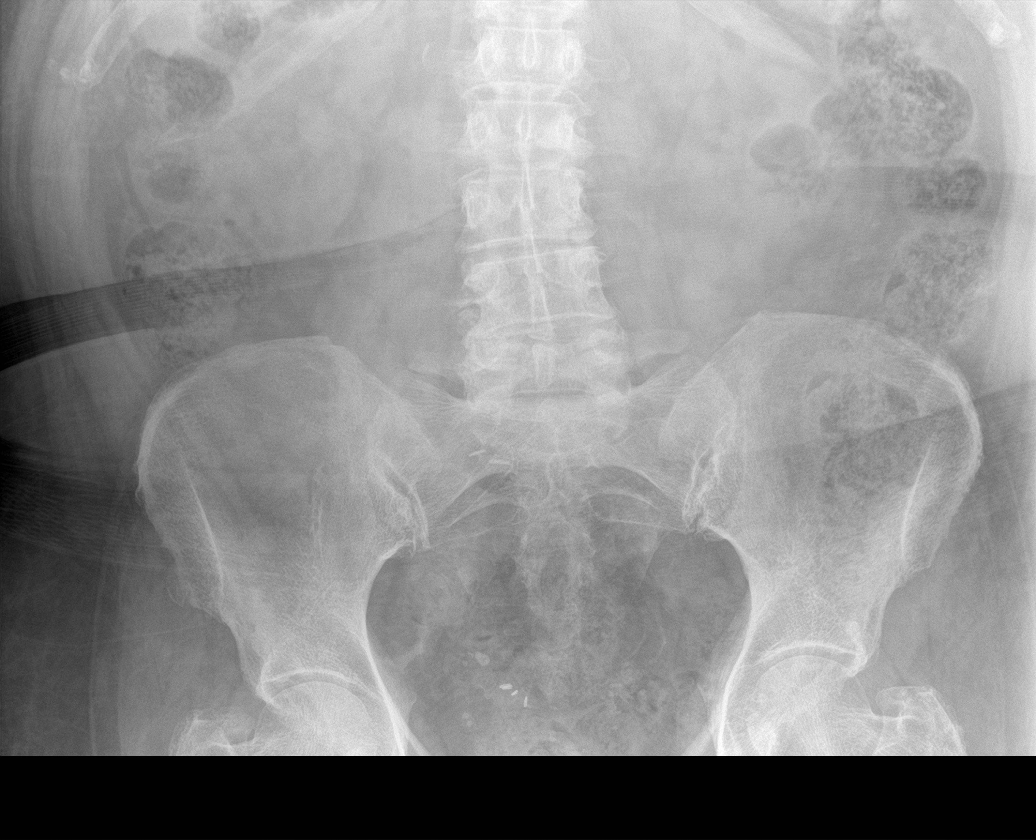

[abdomen supine (2 of 2)]
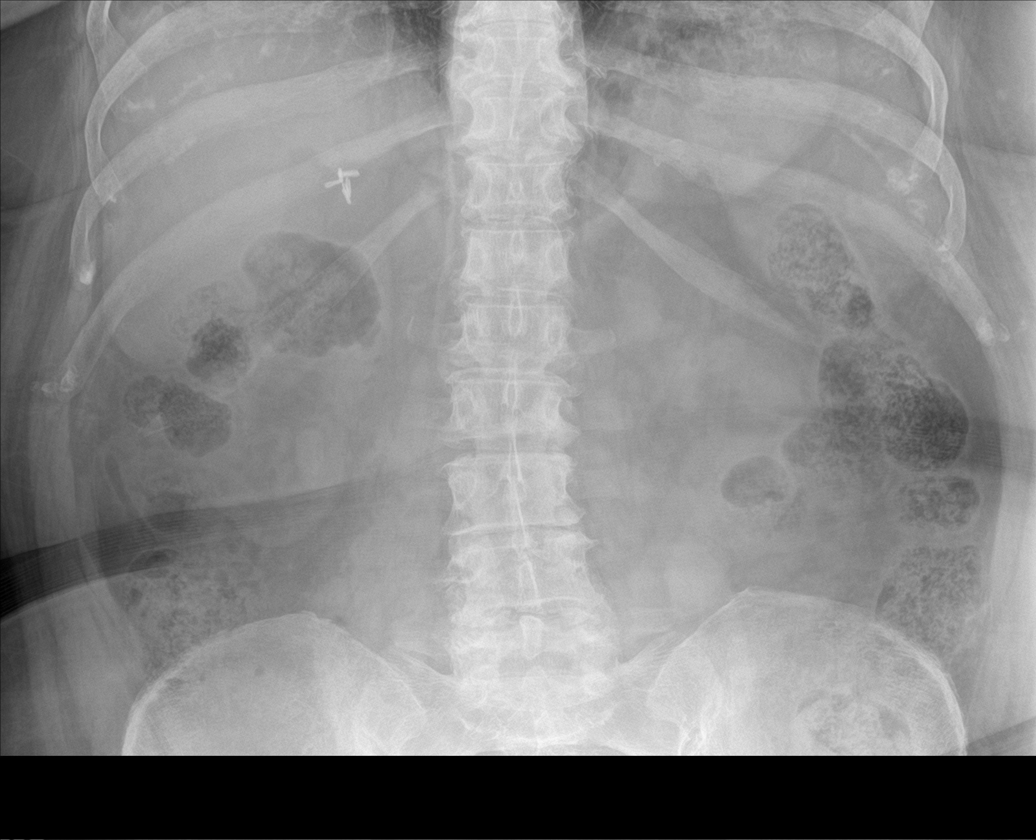

[4 of 4 positions shown; findings below may reference images not displayed]

FINDINGS: Bowel gas pattern unremarkable without evidence of obstruction or
significant ileus. No evidence of free air or significant air-fluid
levels on the erect image. Moderate to large stool burden in the
colon. Surgical clips in the RIGHT UPPER QUADRANT from prior
cholecystectomy. No visible urinary tract calculi. Phleboliths low
in both sides of the pelvis.

Cardiac silhouette normal in size, unchanged. Thoracic aorta mildly
atherosclerotic, unchanged. Hilar and mediastinal contours otherwise
unremarkable. Lungs clear. Bronchovascular markings normal.
Pulmonary vascularity normal. No visible pleural effusions. No
pneumothorax. Degenerative changes throughout the thoracic and
lumbar spine.
IMPRESSION: 1. No acute abdominal abnormality. Moderate to large colonic stool
burden.
2. No acute cardiopulmonary disease.

## 2020-06-12 LAB — SARS-COV-2, NAA 2 DAY TAT

## 2020-06-12 LAB — NOVEL CORONAVIRUS, NAA: SARS-CoV-2, NAA: NOT DETECTED

## 2020-08-14 DIAGNOSIS — G479 Sleep disorder, unspecified: Secondary | ICD-10-CM | POA: Diagnosis not present

## 2020-08-14 DIAGNOSIS — N951 Menopausal and female climacteric states: Secondary | ICD-10-CM | POA: Diagnosis not present

## 2020-08-21 DIAGNOSIS — H53483 Generalized contraction of visual field, bilateral: Secondary | ICD-10-CM | POA: Diagnosis not present

## 2020-08-26 ENCOUNTER — Telehealth: Payer: Self-pay

## 2020-08-26 NOTE — Telephone Encounter (Signed)
   [facility] Pre-operative Risk Assessment    HEARTCARE STAFF: - Please ensure there is not already an duplicate clearance open for this procedure. - Under Visit Info/Reason for Call, type in Other and utilize the format Clearance MM/DD/YY or Clearance TBD. Do not use dashes or single digits. - If request is for dental extraction, please clarify the # of teeth to be extracted.  Request for surgical clearance:  1. What type of surgery is being performed? Bilateral Upper Eyelid Blepharoplasty    2. When is this surgery scheduled? 09/14/2020   3. What type of clearance is required (medical clearance vs. Pharmacy clearance to hold med vs. Both)? BOTH  4. Are there any medications that need to be held prior to surgery and how long? Eliquis    5. Practice name and name of physician performing surgery? [facility]    6. What is the office phone number? [phone]   7.   What is the office fax number? [phone]  8.   Anesthesia type (None, local, MAC, general) ?    [name] 08/26/2020, 2:00 PM  _________________________________________________________________   (provider comments below)

## 2020-08-31 DIAGNOSIS — R059 Cough, unspecified: Secondary | ICD-10-CM | POA: Diagnosis not present

## 2020-08-31 DIAGNOSIS — R430 Anosmia: Secondary | ICD-10-CM | POA: Diagnosis not present

## 2020-08-31 DIAGNOSIS — Z20822 Contact with and (suspected) exposure to covid-19: Secondary | ICD-10-CM | POA: Diagnosis not present

## 2020-09-07 NOTE — Telephone Encounter (Signed)
Ester with [facility]  called in to fu on the Clearance.    Best number [phone]

## 2020-09-08 ENCOUNTER — Other Ambulatory Visit: Payer: Self-pay | Admitting: Cardiology

## 2020-09-08 DIAGNOSIS — Z7901 Long term (current) use of anticoagulants: Secondary | ICD-10-CM

## 2020-09-08 DIAGNOSIS — Z03818 Encounter for observation for suspected exposure to other biological agents ruled out: Secondary | ICD-10-CM | POA: Diagnosis not present

## 2020-09-08 NOTE — Telephone Encounter (Signed)
I s/w the pt in regards to needing lab work done for pre op clearance, bmet, cbc. Pt has opted to have the lab work done here at the [facility] location. Orders have been placed and appt has been made for lab work 09/09/20. Pt thanked me for the call and the help.

## 2020-09-08 NOTE — Telephone Encounter (Signed)
Patient with diagnosis of atrial fibrillation on Eliquis for anticoagulation.    Procedure: bilateral upper eyelid blepharoplasty Date of procedure: 09/14/20    CHA2DS2-VASc Score = 3  This indicates a 3.2% annual risk of stroke. The patient's score is based upon: CHF History: No HTN History: Yes Diabetes History: No Stroke History: No Vascular Disease History: No Age Score: 1 Gender Score: 1  Last available labs were from Oct 202.  Will need updated BMET and CBC per protocol.

## 2020-09-08 NOTE — Telephone Encounter (Signed)
    Please scheduled the patient for updated BMET and CBC per pharmacy [facility] holding protocol. I will place orders. Please call the patient and inform her of date and time of lab appointment once made.   Thank you Georgie Chard NP-C [facility] Pager: [phone]

## 2020-09-09 ENCOUNTER — Other Ambulatory Visit: Payer: PPO

## 2020-09-10 DIAGNOSIS — Z03818 Encounter for observation for suspected exposure to other biological agents ruled out: Secondary | ICD-10-CM | POA: Diagnosis not present

## 2020-09-11 NOTE — Telephone Encounter (Signed)
Looks like pt did not show for labs. Can we try to call her again. thanks

## 2020-09-11 NOTE — Telephone Encounter (Signed)
Patient did not show up for her labs. Procedure is now Monday. Based off of labs for 10/20 pt kidney function is adequate. May hold Eliquis for 2 days prior to procedure.

## 2020-09-11 NOTE — Telephone Encounter (Signed)
   Primary Cardiologist: Shirlee More, MD  Chart reviewed as part of pre-operative protocol coverage.   Left a voicemail for patient to call back for ongoing preoperative assessment.   Abigail Butts, PA-C 09/11/20; 3:05 PM

## 2020-09-14 DIAGNOSIS — Z03818 Encounter for observation for suspected exposure to other biological agents ruled out: Secondary | ICD-10-CM | POA: Diagnosis not present

## 2020-09-16 ENCOUNTER — Telehealth: Payer: Self-pay | Admitting: Cardiology

## 2020-09-16 DIAGNOSIS — Z03818 Encounter for observation for suspected exposure to other biological agents ruled out: Secondary | ICD-10-CM | POA: Diagnosis not present

## 2020-09-16 NOTE — Telephone Encounter (Signed)
Left message for patient to return call.

## 2020-09-16 NOTE — Telephone Encounter (Signed)
New Message:     Pt says she is supposed to have lab work with her primary doctor on Friday(09-18-20). She wants to know if she have her lab work there, if you let her know what she needs please?

## 2020-09-16 NOTE — Telephone Encounter (Signed)
Patient called me back. She wants to know what Dr. Bettina Gavia had ordered for her for labs in in January. I advised her I saw nothing from him but a cbc and bmp from [place], NP. She thanked me. She is going to pcp and needed those labs prior to her upcoming surgery. She had no further questions.

## 2020-09-18 ENCOUNTER — Other Ambulatory Visit: Payer: Self-pay

## 2020-09-18 DIAGNOSIS — E559 Vitamin D deficiency, unspecified: Secondary | ICD-10-CM | POA: Diagnosis not present

## 2020-09-18 DIAGNOSIS — I10 Essential (primary) hypertension: Secondary | ICD-10-CM | POA: Diagnosis not present

## 2020-09-18 DIAGNOSIS — J4520 Mild intermittent asthma, uncomplicated: Secondary | ICD-10-CM | POA: Diagnosis not present

## 2020-09-18 DIAGNOSIS — Z6841 Body Mass Index (BMI) 40.0 and over, adult: Secondary | ICD-10-CM | POA: Diagnosis not present

## 2020-09-18 DIAGNOSIS — H02403 Unspecified ptosis of bilateral eyelids: Secondary | ICD-10-CM | POA: Diagnosis not present

## 2020-09-18 DIAGNOSIS — N951 Menopausal and female climacteric states: Secondary | ICD-10-CM | POA: Diagnosis not present

## 2020-09-18 DIAGNOSIS — K219 Gastro-esophageal reflux disease without esophagitis: Secondary | ICD-10-CM | POA: Diagnosis not present

## 2020-09-18 DIAGNOSIS — E039 Hypothyroidism, unspecified: Secondary | ICD-10-CM | POA: Diagnosis not present

## 2020-09-18 DIAGNOSIS — I4891 Unspecified atrial fibrillation: Secondary | ICD-10-CM | POA: Diagnosis not present

## 2020-09-18 MED ORDER — FUROSEMIDE 20 MG PO TABS: 20.0000 mg | ORAL_TABLET | ORAL | 3 refills | Status: DC

## 2020-09-18 NOTE — Telephone Encounter (Signed)
I still do not see the labs done. Left message with the requesting office to see if the surgery was rescheduled

## 2020-09-18 NOTE — Telephone Encounter (Signed)
Lasix sent to her pharmacy

## 2020-09-21 NOTE — Telephone Encounter (Signed)
I have attempted to contact patient's PCP by phone to obtain labs, but there was no answer.  I will continue to try later.

## 2020-09-21 NOTE — Telephone Encounter (Addendum)
Labs via [place] from [facility] dated 09/18/2020: ALT 23, AST 28, BUN 19, GFR 63, creatinine 0.77, K 4.3  The most recent CBC available in [place] is from 06/2019.   Her procedure has been rescheduled due to COVID-19 exposure. Will route to pharmacy team to see if they prefer updated CBC or are comfortable providing clearance.   Loel Dubonnet, NP

## 2020-09-21 NOTE — Telephone Encounter (Signed)
   Primary Cardiologist: Shirlee More, MD  Chart reviewed as part of pre-operative protocol coverage. Given past medical history and time since last visit, based on ACC/AHA guidelines, [name] would be at acceptable risk for the planned procedure without further cardiovascular testing.   Per pharmacy review she may hold Eliquis 2 days prior to the planned procedure. Recommendations sent via MyChart.   I will route this recommendation to the requesting party via Epic fax function and remove from pre-op pool.  Please call with questions.  Loel Dubonnet, NP 09/21/2020, 12:01 PM

## 2020-09-21 NOTE — Telephone Encounter (Signed)
Pt has rescheduled procedure due to COVID exposure. She had labs at her PCP on 09/18/20.   Please reach out to PCP's office to have labs faxed and entered so clinic pharmacist can complete her hold time.

## 2020-09-21 NOTE — Telephone Encounter (Signed)
Still ok to hold for 2 days

## 2020-10-01 DIAGNOSIS — I10 Essential (primary) hypertension: Secondary | ICD-10-CM | POA: Insufficient documentation

## 2020-10-01 DIAGNOSIS — I4891 Unspecified atrial fibrillation: Secondary | ICD-10-CM | POA: Insufficient documentation

## 2020-10-01 DIAGNOSIS — R011 Cardiac murmur, unspecified: Secondary | ICD-10-CM | POA: Insufficient documentation

## 2020-10-08 NOTE — Progress Notes (Signed)
Cardiology Office Note:    Date:  10/09/2020   ID:  [name], DOB [birth year], MRN [id number]  PCP:  Kathyrn Lass, MD  Cardiologist:  Shirlee More, MD    Referring MD: Kathyrn Lass, MD    ASSESSMENT:    1. PAF (paroxysmal atrial fibrillation) ([place])   2. Chronic anticoagulation   3. Hypertensive heart disease without heart failure    PLAN:    In order of problems listed above:  1. She continues to do well maintaining sinus rhythm and anticoagulated without bleeding complication. 2. Continue her anticoagulant 3. BP at target she takes a low-dose of a calcium channel blocker along with ARB thiazide diuretic with normal renal function.  She no longer is on a loop diuretic   Next appointment: 6 months   Medication Adjustments/Labs and Tests Ordered: Current medicines are reviewed at length with the patient today.  Concerns regarding medicines are outlined above.  No orders of the defined types were placed in this encounter.  No orders of the defined types were placed in this encounter.   No chief complaint on file.   History of Present Illness:    [name] is a 75 y.o. female with a hx of paroxysmal atrial fibrillation with anticoagulation, hypertension and sinus bradycardia.  She was last seen 04/17/2020 maintaining sinus rhythm Compliance with diet, lifestyle and medications: Yes  She tolerates her anticoagulant without bleeding complication.  She has had no palpitation with her current medication syncope no edema chest pain or shortness of breath. 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  Recent labs 09/23/2020: Cholesterol 175 LDL 99 triglycerides 121 HDL 55 TSH normal 3.94 creatinine 0.77 potassium 4.3 hemoglobin 13.9 platelets 235,000 Past Medical History:  Diagnosis Date  . Atrial fibrillation ([place])   . Cervical radiculopathy 02/23/2018  . Chaotic atrial rhythm  10/19/2018  . Chronic anticoagulation 01/24/2019  . DDD (degenerative disc disease), cervical 03/23/2018  . Fecal occult blood test positive 12/06/2018  . Heart murmur    --innocent  . Hypertension   . Hypertensive heart disease without heart failure 12/10/2018  . PAF (paroxysmal atrial fibrillation) ([place]) 12/10/2018  . SVT (supraventricular tachycardia) ([place]) 04/24/2019    Past Surgical History:  Procedure Laterality Date  . ABDOMINAL HYSTERECTOMY  03/2010   TAH/BSO  . CESAREAN SECTION  1981  . 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 ROTATOR CUFF REPAIR Left 2010  . TOTAL KNEE ARTHROPLASTY Bilateral     Current Medications: Current Meds  Medication Sig  . albuterol (VENTOLIN HFA) 108 (90 Base) MCG/ACT inhaler ProAir HFA 90 mcg/actuation aerosol inhaler  INHALE 1 TO 2 PUFFS EVERY 4 TO 6 HOURS AS NEEDED 30 DAYS  . apixaban (ELIQUIS) 5 MG TABS tablet Take 1 tablet (5 mg total) by mouth 2 (two) times daily.  Marland Kitchen losartan-hydrochlorothiazide (HYZAAR) 50-12.5 MG tablet Take 1 tablet by mouth daily.  . pantoprazole (PROTONIX) 40 MG tablet Take 40 mg by mouth daily as needed.  . verapamil (CALAN) 80 MG tablet TAKE 1 TABLET (80 MG TOTAL) BY MOUTH 2 (TWO) TIMES DAILY.  Marland Kitchen Vitamin D, Ergocalciferol, (DRISDOL) 1.25 MG (50000 UT) CAPS capsule TAKE 1 CAPSULE BY MOUTH 2 TIMES A WEEK     Allergies:   Codeine and Sulfa antibiotics   Social History   Socioeconomic History  . Marital status: Divorced    Spouse name: Not  on file  . Number of children: Not on file  . Years of education: Not on file  . Highest education level: Not on file  Occupational History  . Not on file  Tobacco Use  . Smoking status: Never Smoker  . Smokeless tobacco: Never Used  Vaping Use  . Vaping Use: Never used  Substance and Sexual Activity  . Alcohol use: Yes    Comment: occ wine  . Drug use: Never  . Sexual activity: Never    Partners: Male    Birth control/protection:  Surgical    Comment: TAH/BSO  Other Topics Concern  . Not on file  Social History Narrative  . Not on file   Social Determinants of Health   Financial Resource Strain: Not on file  Food Insecurity: Not on file  Transportation Needs: Not on file  Physical Activity: Not on file  Stress: Not on file  Social Connections: Not on file     Family History: The patient's family history includes Breast cancer in her mother, paternal aunt, and paternal aunt; Heart attack in her maternal grandfather; Stroke in her paternal aunt and another family member. ROS:   Please see the history of present illness.    All other systems reviewed and are negative.  EKGs/Labs/Other Studies Reviewed:    The following studies were reviewed today:   Physical Exam:    VS:  BP 134/74   Pulse 84   Ht 5\' 2"  (1.575 m)   Wt 231 lb (104.8 kg)   SpO2 94%   BMI 42.25 kg/m     Wt Readings from Last 3 Encounters:  10/09/20 231 lb (104.8 kg)  04/17/20 231 lb (104.8 kg)  09/16/19 241 lb (109.3 kg)     GEN:  Well nourished, well developed in no acute distress HEENT: Normal NECK: No JVD; No carotid bruits LYMPHATICS: No lymphadenopathy CARDIAC: RRR, no murmurs, rubs, gallops RESPIRATORY:  Clear to auscultation without rales, wheezing or rhonchi  ABDOMEN: Soft, non-tender, non-distended MUSCULOSKELETAL:  No edema; No deformity  SKIN: Warm and dry NEUROLOGIC:  Alert and oriented x 3 PSYCHIATRIC:  Normal affect    Signed, Shirlee More, MD  10/09/2020 12:04 PM    [facility]

## 2020-10-09 ENCOUNTER — Ambulatory Visit: Payer: PPO | Admitting: Cardiology

## 2020-10-09 ENCOUNTER — Encounter: Payer: Self-pay | Admitting: Cardiology

## 2020-10-09 ENCOUNTER — Other Ambulatory Visit: Payer: Self-pay

## 2020-10-09 VITALS — BP 134/74 | HR 84 | Ht 62.0 in | Wt 231.0 lb

## 2020-10-09 DIAGNOSIS — I119 Hypertensive heart disease without heart failure: Secondary | ICD-10-CM | POA: Diagnosis not present

## 2020-10-09 DIAGNOSIS — Z7901 Long term (current) use of anticoagulants: Secondary | ICD-10-CM

## 2020-10-09 DIAGNOSIS — I480 Paroxysmal atrial fibrillation: Secondary | ICD-10-CM | POA: Diagnosis not present

## 2020-10-09 NOTE — Patient Instructions (Signed)

## 2020-10-19 NOTE — Telephone Encounter (Signed)
  Office is calling because they have not received the surgical clearance from our office. New updated date for surgery is 11/02/20. Please see initial part of phone note for Clearance.

## 2020-10-19 NOTE — Telephone Encounter (Signed)
Attempted to call patient today to review clinical status since last office visit. Voicemail box full.  Unable to leave message. Richardson Dopp, PA-C    10/19/2020 12:27 PM

## 2020-10-21 NOTE — Telephone Encounter (Signed)
   Primary Cardiologist: Shirlee More, MD  Chart reviewed as part of pre-operative protocol coverage.   Hx:  AFib, HTN  Last seen by Dr. Bettina Gavia:  10/09/20  RCRI:  Perioperative Risk of Major Cardiac Event is (%): 0.4 (low risk) DASI:    (functional status is good )  Patient was contacted 10/21/2020 in reference to pre-operative risk assessment for pending surgery as outlined below.    Since last seen, MAKAELYN APONTE has done well without chest pain or shortness of breath.  Recommendations: Therefore, based on ACC/AHA guidelines, the patient would be at acceptable risk for the planned procedure without further cardiovascular testing.  The patient may hold Apixaban (Eliquis) for 2 days prior to procedure.  This should be resumed postoperatively as soon is felt to be safe.   Please call with questions. Richardson Dopp, PA-C 10/21/2020, 1:50 PM

## 2020-10-21 NOTE — Telephone Encounter (Signed)
Notes faxed to surgeon. This phone note will be removed from the preop pool. Richardson Dopp, PA-C  10/21/2020 1:54 PM

## 2020-11-02 DIAGNOSIS — H02831 Dermatochalasis of right upper eyelid: Secondary | ICD-10-CM | POA: Diagnosis not present

## 2020-11-02 DIAGNOSIS — H53453 Other localized visual field defect, bilateral: Secondary | ICD-10-CM | POA: Diagnosis not present

## 2020-11-02 DIAGNOSIS — H02834 Dermatochalasis of left upper eyelid: Secondary | ICD-10-CM | POA: Diagnosis not present

## 2020-11-09 DIAGNOSIS — M25511 Pain in right shoulder: Secondary | ICD-10-CM | POA: Diagnosis not present

## 2020-11-30 DIAGNOSIS — M25511 Pain in right shoulder: Secondary | ICD-10-CM | POA: Diagnosis not present

## 2020-12-04 DIAGNOSIS — Z1389 Encounter for screening for other disorder: Secondary | ICD-10-CM | POA: Diagnosis not present

## 2020-12-04 DIAGNOSIS — Z0000 Encounter for general adult medical examination without abnormal findings: Secondary | ICD-10-CM | POA: Diagnosis not present

## 2020-12-08 DIAGNOSIS — M75121 Complete rotator cuff tear or rupture of right shoulder, not specified as traumatic: Secondary | ICD-10-CM | POA: Diagnosis not present

## 2020-12-08 DIAGNOSIS — M25511 Pain in right shoulder: Secondary | ICD-10-CM | POA: Diagnosis not present

## 2020-12-14 ENCOUNTER — Telehealth: Payer: Self-pay

## 2020-12-14 NOTE — Telephone Encounter (Signed)
   [facility] Pre-operative Risk Assessment    Patient Name: [name]  DOB: 1946/07/19  MRN: [id number]   HEARTCARE STAFF: - Please ensure there is not already an duplicate clearance open for this procedure. - Under Visit Info/Reason for Call, type in Other and utilize the format Clearance MM/DD/YY or Clearance TBD. Do not use dashes or single digits. - If request is for dental extraction, please clarify the # of teeth to be extracted.  Request for surgical clearance:  1. What type of surgery is being performed? Right shoulder rotator cuff repair.    2. When is this surgery scheduled? TBD   3. What type of clearance is required (medical clearance vs. Pharmacy clearance to hold med vs. Both)? Both  4. Are there any medications that need to be held prior to surgery and how long? None noted.    5. Practice name and name of physician performing surgery?  [facility] Dr. Veverly Fells.    6. What is the office phone number? [phone]    7.   What is the office fax number? [phone]  8.   Anesthesia type (None, local, MAC, general) ? General   Gita Kudo 12/14/2020, 1:13 PM  _________________________________________________________________   (provider comments below)

## 2020-12-14 NOTE — Telephone Encounter (Signed)
Please comment on Eliquis, thanks!

## 2020-12-14 NOTE — Telephone Encounter (Signed)
Patient with diagnosis of afib on Eliquis for anticoagulation.    Procedure: Right shoulder rotator cuff repair Date of procedure: TBD  CHA2DS2-VASc Score = 3  This indicates a 3.2% annual risk of stroke. The patient's score is based upon: CHF History: No HTN History: Yes Diabetes History: No Stroke History: No Vascular Disease History: No Age Score: 1 Gender Score: 1     CrCl 73.9 ml/min  Per office protocol, patient can hold Eliquis for 3 days prior to procedure.

## 2020-12-15 NOTE — Telephone Encounter (Signed)
Called and LVM to call back and ask for pre-op team.

## 2020-12-18 DIAGNOSIS — N951 Menopausal and female climacteric states: Secondary | ICD-10-CM | POA: Diagnosis not present

## 2020-12-22 ENCOUNTER — Other Ambulatory Visit: Payer: Self-pay

## 2020-12-22 MED ORDER — VERAPAMIL HCL 80 MG PO TABS: ORAL_TABLET | ORAL | 2 refills | Status: DC

## 2020-12-22 NOTE — Telephone Encounter (Signed)
Refill of Verapamil 80 mg sent to [facility] in [place].

## 2020-12-24 NOTE — Telephone Encounter (Signed)
   Name: [name]  DOB: Dec 23, 1945  MRN: [id number]   Primary Cardiologist: Shirlee More, MD  Chart reviewed as part of pre-operative protocol coverage. Patient was contacted 12/24/2020 in reference to pre-operative risk assessment for pending surgery as outlined below.  [name] was last seen on 10/09/2020 by Dr. Bettina Gavia.  Since that day, [name] has done well without any chest pain or worsening dyspnea.  She is able to accomplish more than 4 METS of activity without any issue.  Therefore, based on ACC/AHA guidelines, the patient would be at acceptable risk for the planned procedure without further cardiovascular testing.   Per our clinical pharmacist, patient may hold Eliquis for 3 days prior to the procedure and restart as soon as possible afterward at the surgeon's discretion.  The patient was advised that if she develops new symptoms prior to surgery to contact our office to arrange for a follow-up visit, and she verbalized understanding.  I will route this recommendation to the requesting party via Epic fax function and remove from pre-op pool. Please call with questions.  [place], [place] 12/24/2020, 6:12 PM

## 2020-12-25 DIAGNOSIS — Z6841 Body Mass Index (BMI) 40.0 and over, adult: Secondary | ICD-10-CM | POA: Diagnosis not present

## 2020-12-25 DIAGNOSIS — N951 Menopausal and female climacteric states: Secondary | ICD-10-CM | POA: Diagnosis not present

## 2020-12-25 DIAGNOSIS — E039 Hypothyroidism, unspecified: Secondary | ICD-10-CM | POA: Diagnosis not present

## 2020-12-25 DIAGNOSIS — R232 Flushing: Secondary | ICD-10-CM | POA: Diagnosis not present

## 2021-01-04 DIAGNOSIS — Y999 Unspecified external cause status: Secondary | ICD-10-CM | POA: Diagnosis not present

## 2021-01-04 DIAGNOSIS — X58XXXA Exposure to other specified factors, initial encounter: Secondary | ICD-10-CM | POA: Diagnosis not present

## 2021-01-04 DIAGNOSIS — S43431A Superior glenoid labrum lesion of right shoulder, initial encounter: Secondary | ICD-10-CM | POA: Diagnosis not present

## 2021-01-04 DIAGNOSIS — M7541 Impingement syndrome of right shoulder: Secondary | ICD-10-CM | POA: Diagnosis not present

## 2021-01-04 DIAGNOSIS — G8918 Other acute postprocedural pain: Secondary | ICD-10-CM | POA: Diagnosis not present

## 2021-01-04 DIAGNOSIS — M94211 Chondromalacia, right shoulder: Secondary | ICD-10-CM | POA: Diagnosis not present

## 2021-01-04 DIAGNOSIS — S46011A Strain of muscle(s) and tendon(s) of the rotator cuff of right shoulder, initial encounter: Secondary | ICD-10-CM | POA: Diagnosis not present

## 2021-01-07 DIAGNOSIS — M25511 Pain in right shoulder: Secondary | ICD-10-CM | POA: Diagnosis not present

## 2021-01-13 ENCOUNTER — Telehealth: Payer: Self-pay | Admitting: Cardiology

## 2021-01-13 DIAGNOSIS — M25511 Pain in right shoulder: Secondary | ICD-10-CM | POA: Diagnosis not present

## 2021-01-13 NOTE — Telephone Encounter (Signed)
Spoke to the patient just now and let her know Dr. Joya Gaskins recommendations. She verbalizes understanding and thanks me for the call back.

## 2021-01-13 NOTE — Telephone Encounter (Signed)
Pt c/o swelling: STAT is pt has developed SOB within 24 hours  1) How much weight have you gained and in what time span?  Not sure  2) If swelling, where is the swelling located? Feet, thigh and ankles  3) Are you currently taking a fluid pill? Yes  4) Are you currently SOB? A little *  5) Do you have a log of your daily weights (if so, list)?   6) Have you gained 3 pounds in a day or 5 pounds in a week?not sure  7) Have you traveled recently? no

## 2021-01-13 NOTE — Telephone Encounter (Signed)
Left message on patients voicemail to please return our call.   

## 2021-01-13 NOTE — Telephone Encounter (Signed)
Spoke to the patient just now and she let me know that she has some swelling in her feet and ankles. She states that she can press her finger down into her legs indicating pitting edema. She had surgery to have her rotator cuff repaired one week ago. The swelling that she is experiencing started about 5 days after her surgery. She states that she is unable to keep her feet elevated due to her shoulder surgery. She does not weigh and does not know if she has gained any weight. She states that she does not have a way to take her vital signs right now but she had PT this morning and her blood pressure and heart rate were good at this time. She has taken her furosemide twice this week already but states that she normally does not take it at all.   She is following up with the surgeons office tomorrow morning.   She is wanting advice from Dr. Bettina Gavia in regards to this swelling.

## 2021-01-13 NOTE — Telephone Encounter (Signed)
I would have her take her furosemide twice daily for 2 days and then daily until her edema resolves.  Not uncommon after surgery with IV fluids to have sodium retention.

## 2021-01-14 ENCOUNTER — Telehealth: Payer: Self-pay | Admitting: Cardiology

## 2021-01-14 MED ORDER — POTASSIUM CHLORIDE ER 10 MEQ PO TBCR: 10.0000 meq | EXTENDED_RELEASE_TABLET | Freq: Every day | ORAL | 0 refills | Status: DC

## 2021-01-14 NOTE — Telephone Encounter (Signed)
It is probably a good idea lets put her on potassium 10 mEq daily #31 daily

## 2021-01-14 NOTE — Telephone Encounter (Signed)
Pt c/o medication issue:  1. Name of Medication: Potassium    2. How are you currently taking this medication (dosage and times per day)? NA   3. Are you having a reaction (difficulty breathing--STAT)? Na   4. What is your medication issue? Pt called in and stated that her orthopedic surgeon told her to call Dr Bettina Gavia and see if he could call her in a Potassium pill.     Pharmacy [facility] [address]

## 2021-01-14 NOTE — Telephone Encounter (Signed)
Pt states that she was instructed to increase her furosemide to 20 mg twice daily for 2 days then 20 mg daily until the edema is resolved. Pt states her orthopedic MD suggested she call and get a RX for potassium with the increase of lasix. How do you advise?

## 2021-01-19 DIAGNOSIS — M25511 Pain in right shoulder: Secondary | ICD-10-CM | POA: Insufficient documentation

## 2021-01-19 HISTORY — DX: Pain in right shoulder: M25.511

## 2021-01-26 DIAGNOSIS — M25511 Pain in right shoulder: Secondary | ICD-10-CM | POA: Diagnosis not present

## 2021-01-26 DIAGNOSIS — N951 Menopausal and female climacteric states: Secondary | ICD-10-CM | POA: Diagnosis not present

## 2021-01-26 DIAGNOSIS — E039 Hypothyroidism, unspecified: Secondary | ICD-10-CM | POA: Diagnosis not present

## 2021-01-29 DIAGNOSIS — Z6841 Body Mass Index (BMI) 40.0 and over, adult: Secondary | ICD-10-CM | POA: Diagnosis not present

## 2021-01-29 DIAGNOSIS — R232 Flushing: Secondary | ICD-10-CM | POA: Diagnosis not present

## 2021-01-29 DIAGNOSIS — N951 Menopausal and female climacteric states: Secondary | ICD-10-CM | POA: Diagnosis not present

## 2021-01-29 DIAGNOSIS — E039 Hypothyroidism, unspecified: Secondary | ICD-10-CM | POA: Diagnosis not present

## 2021-02-02 DIAGNOSIS — M25511 Pain in right shoulder: Secondary | ICD-10-CM | POA: Diagnosis not present

## 2021-02-09 DIAGNOSIS — M25511 Pain in right shoulder: Secondary | ICD-10-CM | POA: Diagnosis not present

## 2021-02-13 ENCOUNTER — Other Ambulatory Visit: Payer: Self-pay | Admitting: Cardiology

## 2021-02-15 DIAGNOSIS — M25511 Pain in right shoulder: Secondary | ICD-10-CM | POA: Diagnosis not present

## 2021-02-15 NOTE — Telephone Encounter (Signed)
Refill sent to pharmacy.   

## 2021-02-23 DIAGNOSIS — M25511 Pain in right shoulder: Secondary | ICD-10-CM | POA: Diagnosis not present

## 2021-02-25 DIAGNOSIS — M25511 Pain in right shoulder: Secondary | ICD-10-CM | POA: Diagnosis not present

## 2021-03-02 DIAGNOSIS — M25511 Pain in right shoulder: Secondary | ICD-10-CM | POA: Diagnosis not present

## 2021-03-10 DIAGNOSIS — M25511 Pain in right shoulder: Secondary | ICD-10-CM | POA: Diagnosis not present

## 2021-03-15 DIAGNOSIS — M25511 Pain in right shoulder: Secondary | ICD-10-CM | POA: Diagnosis not present

## 2021-03-18 DIAGNOSIS — M25511 Pain in right shoulder: Secondary | ICD-10-CM | POA: Diagnosis not present

## 2021-03-23 DIAGNOSIS — M25511 Pain in right shoulder: Secondary | ICD-10-CM | POA: Diagnosis not present

## 2021-03-29 DIAGNOSIS — M25511 Pain in right shoulder: Secondary | ICD-10-CM | POA: Diagnosis not present

## 2021-04-22 DIAGNOSIS — M25511 Pain in right shoulder: Secondary | ICD-10-CM | POA: Diagnosis not present

## 2021-05-07 ENCOUNTER — Other Ambulatory Visit: Payer: Self-pay

## 2021-05-07 ENCOUNTER — Encounter: Payer: Self-pay | Admitting: Cardiology

## 2021-05-07 ENCOUNTER — Ambulatory Visit: Payer: PPO | Admitting: Cardiology

## 2021-05-07 VITALS — BP 142/70 | HR 73 | Ht 62.0 in | Wt 232.1 lb

## 2021-05-07 DIAGNOSIS — Z7901 Long term (current) use of anticoagulants: Secondary | ICD-10-CM | POA: Diagnosis not present

## 2021-05-07 DIAGNOSIS — I119 Hypertensive heart disease without heart failure: Secondary | ICD-10-CM | POA: Diagnosis not present

## 2021-05-07 DIAGNOSIS — I480 Paroxysmal atrial fibrillation: Secondary | ICD-10-CM | POA: Diagnosis not present

## 2021-05-07 NOTE — Patient Instructions (Signed)

## 2021-05-07 NOTE — Progress Notes (Signed)
Cardiology Office Note:    Date:  05/07/2021   ID:  [name], DOB [birth year], MRN [id number]  PCP:  Kathyrn Lass, MD  Cardiologist:  Shirlee More, MD    Referring MD: Kathyrn Lass, MD    ASSESSMENT:    1. PAF (paroxysmal atrial fibrillation) ([place])   2. Chronic anticoagulation   3. Hypertensive heart disease without heart failure    PLAN:    In order of problems listed above:  Stable maintaining sinus rhythm she will continue her current anticoagulant and rate limiting calcium channel blocker Stable.  To be at target continue current treatment she takes a combination ARB thiazide diuretic calcium channel blocker and rarely takes furosemide for edema   Next appointment: 6 months   Medication Adjustments/Labs and Tests Ordered: Current medicines are reviewed at length with the patient today.  Concerns regarding medicines are outlined above.  Orders Placed This Encounter  Procedures   EKG 12-Lead   No orders of the defined types were placed in this encounter.   Chief Complaint  Patient presents with   Follow-up   Atrial Fibrillation    History of Present Illness:    [name] is a 75 y.o. female with a hx of paroxysmal atrial fibrillation chronic anticoagulation hypertension and sinus bradycardia.  She was last seen 10/09/2020. Compliance with diet, lifestyle and medications: Yes  She has had a difficult prolonged recovery from shoulder surgery. She is not having problems edema shortness of breath chest pain palpitation or syncope and tolerates anticoagulant without bleeding complication Her EKG over read says atrial fibrillation in sinus rhythm with small P waves and APCs. Recent labs 09/18/2020 cholesterol 175 LDL 99 triglycerides 121 HDL 55 A1c 5.4% potassium 4.3 creatinine 07 Past Medical History:  Diagnosis Date   Atrial fibrillation ([facility])    Cervical radiculopathy 02/23/2018   Chaotic atrial rhythm 10/19/2018   Chronic anticoagulation 01/24/2019    DDD (degenerative disc disease), cervical 03/23/2018   Fecal occult blood test positive 12/06/2018   Heart murmur    --innocent   Hypertension    Hypertensive heart disease without heart failure 12/10/2018   PAF (paroxysmal atrial fibrillation) ([place]) 12/10/2018   SVT (supraventricular tachycardia) ([place]) 04/24/2019    Past Surgical History:  Procedure Laterality Date   ABDOMINAL HYSTERECTOMY  03/2010   TAH/BSO   CESAREAN SECTION  1981   ENDOMETRIAL BIOPSY     05-26-09--benign, 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   furosemide (LASIX) 20 MG tablet Take 1 tablet (20 mg total) by mouth 2 (two) times a week. (Patient taking differently: Take 20 mg by mouth 2 (two) times a week. As needed for fluid)   losartan-hydrochlorothiazide (HYZAAR) 50-12.5 MG tablet Take 1 tablet by mouth daily.   pantoprazole (PROTONIX) 40 MG tablet Take 40 mg by mouth daily as needed (acid reflux).   potassium chloride (KLOR-CON) 10 MEQ tablet TAKE 1 TABLET BY MOUTH EVERY DAY   verapamil (CALAN) 80 MG tablet TAKE 1 TABLET (80 MG TOTAL) BY MOUTH 2 (TWO) TIMES DAILY.   Vitamin D, Ergocalciferol, (DRISDOL) 1.25  MG (50000 UT) CAPS capsule TAKE 1 CAPSULE BY MOUTH 2 TIMES A WEEK     Allergies:   Codeine and Sulfa antibiotics   Social History   Socioeconomic History   Marital status: Divorced    Spouse name: Not on file   Number of children: Not on file   Years of education: Not on file   Highest education level: Not on file  Occupational History   Not on file  Tobacco Use   Smoking status: Never   Smokeless tobacco: Never  Vaping Use   Vaping  Use: Never used  Substance and Sexual Activity   Alcohol use: Yes    Comment: occ wine   Drug use: Never   Sexual activity: Never    Partners: Male    Birth control/protection: Surgical    Comment: TAH/BSO  Other Topics Concern   Not on file  Social History Narrative   Not on file   Social Determinants of Health   Financial Resource Strain: Not on file  Food Insecurity: Not on file  Transportation Needs: Not on file  Physical Activity: Not on file  Stress: Not on file  Social Connections: Not on file     Family History: The patient's family history includes Breast cancer in her mother, paternal aunt, and paternal aunt; Heart attack in her maternal grandfather; Stroke in her paternal aunt and another family member. ROS:   Please see the history of present illness.    All other systems reviewed and are negative.  EKGs/Labs/Other Studies Reviewed:    The following studies were reviewed today:  EKG:  EKG ordered today and personally reviewed.  The ekg ordered today demonstrates sinus rhythm small amplitude P wave APCs    Physical Exam:    VS:  BP (!) 142/70 (BP Location: Left Arm, Patient Position: Sitting, Cuff Size: Large)   Pulse 73   Ht '5\' 2"'$  (1.575 m)   Wt 232 lb 1.3 oz (105.3 kg)   SpO2 94%   BMI 42.45 kg/m     Wt Readings from Last 3 Encounters:  05/07/21 232 lb 1.3 oz (105.3 kg)  10/09/20 231 lb (104.8 kg)  04/17/20 231 lb (104.8 kg)     GEN:  Well nourished, well developed in no acute distress obese BMI over 40 HEENT: Normal NECK: No JVD; No carotid bruits LYMPHATICS: No lymphadenopathy CARDIAC: RRR, no murmurs, rubs, gallops RESPIRATORY:  Clear to auscultation without rales, wheezing or rhonchi  ABDOMEN: Soft, non-tender, non-distended MUSCULOSKELETAL:  No edema; No deformity  SKIN: Warm and dry NEUROLOGIC:  Alert and oriented x 3 PSYCHIATRIC:  Normal affect    Signed, Shirlee More, MD  05/07/2021 12:30 PM    [facility]

## 2021-05-11 DIAGNOSIS — Z4789 Encounter for other orthopedic aftercare: Secondary | ICD-10-CM | POA: Diagnosis not present

## 2021-05-26 ENCOUNTER — Telehealth: Payer: Self-pay | Admitting: Cardiology

## 2021-05-26 NOTE — Telephone Encounter (Signed)
  Pt needs it today, she is out of meds for 5 days now.

## 2021-05-26 NOTE — Telephone Encounter (Signed)
*  STAT* If patient is at the pharmacy, call can be transferred to refill team.   1. Which medications need to be refilled? (please list name of each medication and dose if known) apixaban (ELIQUIS) 5 MG TABS tablet  2. Which pharmacy/location (including street and city if local pharmacy) is medication to be sent to? [id number] [place], [place]  3. Do they need a 30 day or 90 day supply? [place]

## 2021-05-27 ENCOUNTER — Telehealth: Payer: Self-pay | Admitting: Cardiology

## 2021-05-27 MED ORDER — APIXABAN 5 MG PO TABS: 5.0000 mg | ORAL_TABLET | Freq: Two times a day (BID) | ORAL | 1 refills | Status: DC

## 2021-05-27 NOTE — Addendum Note (Signed)
Addended by: Allean Found on: 05/27/2021 11:08 AM   Modules accepted: Orders

## 2021-05-27 NOTE — Telephone Encounter (Signed)
Patient called back because her prescription hasnt been filled. She is very upset. Stated she will be going up to dr Bettina Gavia office today

## 2021-05-27 NOTE — Telephone Encounter (Signed)
Prescription refill request for Eliquis received. Indication:afib Last office visit:[place] 05/07/21 Scr: 0.770 mg/ 09/18/2020 Age: 80f Weight:105.3kg

## 2021-05-27 NOTE — Telephone Encounter (Signed)
Attempted to call pt back to let them know that the refill was sent but just got a busy dial tone

## 2021-05-29 DIAGNOSIS — Z23 Encounter for immunization: Secondary | ICD-10-CM | POA: Diagnosis not present

## 2021-06-07 DIAGNOSIS — R197 Diarrhea, unspecified: Secondary | ICD-10-CM | POA: Diagnosis not present

## 2021-06-07 DIAGNOSIS — Z20822 Contact with and (suspected) exposure to covid-19: Secondary | ICD-10-CM | POA: Diagnosis not present

## 2021-06-07 DIAGNOSIS — J3489 Other specified disorders of nose and nasal sinuses: Secondary | ICD-10-CM | POA: Diagnosis not present

## 2021-06-07 DIAGNOSIS — R059 Cough, unspecified: Secondary | ICD-10-CM | POA: Diagnosis not present

## 2021-06-07 DIAGNOSIS — R110 Nausea: Secondary | ICD-10-CM | POA: Diagnosis not present

## 2021-06-25 ENCOUNTER — Other Ambulatory Visit: Payer: Self-pay

## 2021-06-25 MED ORDER — LOSARTAN POTASSIUM-HCTZ 50-12.5 MG PO TABS: 1.0000 | ORAL_TABLET | Freq: Every day | ORAL | 3 refills | Status: DC

## 2021-06-25 NOTE — Telephone Encounter (Signed)
Losartan-HCTZ sent

## 2021-06-29 ENCOUNTER — Other Ambulatory Visit: Payer: Self-pay

## 2021-06-29 MED ORDER — FUROSEMIDE 20 MG PO TABS: 20.0000 mg | ORAL_TABLET | ORAL | 3 refills | Status: DC

## 2021-09-10 ENCOUNTER — Ambulatory Visit: Payer: PPO | Admitting: Cardiology

## 2021-09-10 ENCOUNTER — Encounter: Payer: Self-pay | Admitting: Cardiology

## 2021-09-10 ENCOUNTER — Other Ambulatory Visit: Payer: Self-pay

## 2021-09-10 VITALS — BP 138/76 | HR 64 | Ht 62.0 in | Wt 232.8 lb

## 2021-09-10 DIAGNOSIS — I480 Paroxysmal atrial fibrillation: Secondary | ICD-10-CM

## 2021-09-10 DIAGNOSIS — Z7901 Long term (current) use of anticoagulants: Secondary | ICD-10-CM

## 2021-09-10 DIAGNOSIS — R0602 Shortness of breath: Secondary | ICD-10-CM | POA: Diagnosis not present

## 2021-09-10 DIAGNOSIS — I119 Hypertensive heart disease without heart failure: Secondary | ICD-10-CM | POA: Diagnosis not present

## 2021-09-10 MED ORDER — FUROSEMIDE 20 MG PO TABS: 20.0000 mg | ORAL_TABLET | ORAL | 3 refills | Status: DC

## 2021-09-10 MED ORDER — POTASSIUM CHLORIDE ER 10 MEQ PO TBCR: 10.0000 meq | EXTENDED_RELEASE_TABLET | ORAL | 3 refills | Status: DC

## 2021-09-10 NOTE — Patient Instructions (Signed)
Medication Instructions:  Your physician has recommended you make the following change in your medication:  INCREASE: Furosemide and Potassium to three times weekly.  *If you need a refill on your cardiac medications before your next appointment, please call your pharmacy*   Lab Work: Your physician recommends that you return for lab work in: TODAY CBC, Lipids, ProBNP If you have labs (blood work) drawn today and your tests are completely normal, you will receive your results only by: [place] (if you have MyChart) OR A paper copy in the mail If you have any lab test that is abnormal or we need to change your treatment, we will call you to review the results.   Testing/Procedures: None   Follow-Up: At [facility], you and your health needs are our priority.  As part of our continuing mission to provide you with exceptional heart care, we have created designated Provider Care Teams.  These Care Teams include your primary Cardiologist (physician) and Advanced Practice Providers (APPs -  Physician Assistants and Nurse Practitioners) who all work together to provide you with the care you need, when you need it.  We recommend signing up for the patient portal called "MyChart".  Sign up information is provided on this After Visit Summary.  MyChart is used to connect with patients for Virtual Visits (Telemedicine).  Patients are able to view lab/test results, encounter notes, upcoming appointments, etc.  Non-urgent messages can be sent to your provider as well.   To learn more about what you can do with MyChart, go to [email or web address].    Your next appointment:   6 month(s)  The format for your next appointment:   In Person  Provider:   Shirlee More, MD    Other Instructions

## 2021-09-10 NOTE — Progress Notes (Signed)
Cardiology Office Note:    Date:  09/10/2021   ID:  [name], DOB [birth year], MRN [id number]  PCP:  Kathyrn Lass, MD  Cardiologist:  Shirlee More, MD    Referring MD: Kathyrn Lass, MD    ASSESSMENT:    1. PAF (paroxysmal atrial fibrillation) ([place])   2. Chronic anticoagulation   3. Hypertensive heart disease without heart failure   4. SOB (shortness of breath)   5. SOB (shortness of breath) on exertion    PLAN:    In order of problems listed above:  Stable maintaining sinus rhythm continue low-dose calcium channel blocker anticoagulant Stable at target continue treatment including ARB thiazide check renal function potassium She has significant edema short of breath at risk for heart failure check proBNP level check echocardiogram with her ejection murmur   Next appointment: 6 months   Medication Adjustments/Labs and Tests Ordered: Current medicines are reviewed at length with the patient today.  Concerns regarding medicines are outlined above.  Orders Placed This Encounter  Procedures   CBC   Lipid panel   Pro b natriuretic peptide (BNP)   EKG 12-Lead   Meds ordered this encounter  Medications   furosemide (LASIX) 20 MG tablet    Sig: Take 1 tablet (20 mg total) by mouth 3 (three) times a week. As needed for fluid    Dispense:  45 tablet    Refill:  3    Chief Complaint  Patient presents with   Follow-up   Atrial Fibrillation    History of Present Illness:    [name] is a 76 y.o. female with a hx of paroxysmal atrial fibrillation chronically anticoagulated with hypertension and sinus bradycardia last seen 05/07/2021 maintaining sinus rhythm with a rate limiting calcium channel blocker. Compliance with diet, lifestyle and medications: Yes  Overall doing well no sustained or severe palpitation her pulse is regular we will check an EKG in the office today. She has not taken her loop diuretic and she has noticed that she is a little bit more short  of breath.  She has peripheral edema I will ask her to take furosemide 3 days a week we will recheck her labs including proBNP I have researched her chart there is not an echocardiogram we will order 1. She has the adapter for the smart phone asked her to screen her heart rhythm weekly for recurrent atrial fibrillation No bleeding from her anticoagulant Past Medical History:  Diagnosis Date   Atrial fibrillation ([place])    Cervical radiculopathy 02/23/2018   Chaotic atrial rhythm 10/19/2018   Chronic anticoagulation 01/24/2019   DDD (degenerative disc disease), cervical 03/23/2018   Fecal occult blood test positive 12/06/2018   Heart murmur    --innocent   Hypertension    Hypertensive heart disease without heart failure 12/10/2018   PAF (paroxysmal atrial fibrillation) ([place]) 12/10/2018   SVT (supraventricular tachycardia) ([place]) 04/24/2019    Past Surgical History:  Procedure Laterality Date   ABDOMINAL HYSTERECTOMY  03/2010   TAH/BSO   CESAREAN SECTION  1981   ENDOMETRIAL BIOPSY     05-26-09--benign, 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   ROTATOR CUFF REPAIR Left 2010   TOTAL KNEE ARTHROPLASTY Bilateral     Current Medications: Current Meds  Medication Sig   albuterol (VENTOLIN HFA) 108 (90 Base) MCG/ACT inhaler ProAir HFA 90 mcg/actuation aerosol inhaler  INHALE 1 TO 2 PUFFS EVERY 4 TO 6 HOURS AS NEEDED 30 DAYS   apixaban (ELIQUIS) 5 MG TABS tablet Take 1  tablet (5 mg total) by mouth 2 (two) times daily.   losartan-hydrochlorothiazide (HYZAAR) 50-12.5 MG tablet Take 1 tablet by mouth daily.   pantoprazole (PROTONIX) 40 MG tablet Take 40 mg by mouth daily as needed (acid reflux).   potassium chloride (KLOR-CON) 10 MEQ tablet TAKE 1 TABLET BY MOUTH EVERY DAY (Patient taking differently: 10 mEq 2 (two) times a week.)   verapamil (CALAN) 80 MG tablet TAKE 1 TABLET (80 MG TOTAL) BY MOUTH 2 (TWO) TIMES DAILY.   Vitamin D, Ergocalciferol, (DRISDOL) 1.25 MG (50000 UT) CAPS capsule TAKE 1 CAPSULE BY  MOUTH 2 TIMES A WEEK   [DISCONTINUED] furosemide (LASIX) 20 MG tablet Take 1 tablet (20 mg total) by mouth 2 (two) times a week. As needed for fluid     Allergies:   Codeine and Sulfa antibiotics   Social History   Socioeconomic History   Marital status: Divorced    Spouse name: Not on file   Number of children: Not on file   Years of education: Not on file   Highest education level: Not on file  Occupational History   Not on file  Tobacco Use   Smoking status: Never   Smokeless tobacco: Never  Vaping Use   Vaping Use: Never used  Substance and Sexual Activity   Alcohol use: Yes    Comment: occ wine   Drug use: Never   Sexual activity: Never    Partners: Male    Birth control/protection: Surgical    Comment: TAH/BSO  Other Topics Concern   Not on file  Social History Narrative   Not on file   Social Determinants of Health   Financial Resource Strain: Not on file  Food Insecurity: Not on file  Transportation Needs: Not on file  Physical Activity: Not on file  Stress: Not on file  Social Connections: Not on file     Family History: The patient's family history includes Breast cancer in her mother, paternal aunt, and paternal aunt; Heart attack in her maternal grandfather; Stroke in her paternal aunt and another family member. ROS:   Please see the history of present illness.    All other systems reviewed and are negative.  EKGs/Labs/Other Studies Reviewed:    The following studies were reviewed today:  EKG:  EKG ordered today and personally reviewed.  The ekg ordered today demonstrates regular rhythm very small amplitude P wave sinus rhythm no ischemic changes poor R wave progression  Recent Labs: 09/18/2020 cholesterol 175 LDL 99 triglycerides 121 A1c 5.4% hemoglobin 13.5 creatinine 0.77  Physical Exam:    VS:  BP 138/76 (BP Location: Right Arm, Patient Position: Sitting, Cuff Size: Normal)    Pulse 64    Ht 5\' 2"  (1.575 m)    Wt 232 lb 12.8 oz (105.6 kg)     SpO2 96%    BMI 42.58 kg/m     Wt Readings from Last 3 Encounters:  09/10/21 232 lb 12.8 oz (105.6 kg)  05/07/21 232 lb 1.3 oz (105.3 kg)  10/09/20 231 lb (104.8 kg)     GEN: BMI greater than 40 well nourished, well developed in no acute distress HEENT: Normal NECK: No JVD; No carotid bruits LYMPHATICS: No lymphadenopathy CARDIAC: Regular rate and rhythm she has a grade 1-2 ejection murmur aortic area RRR, no  rubs, gallops RESPIRATORY:  Clear to auscultation without rales, wheezing or rhonchi  ABDOMEN: Soft, non-tender, non-distended MUSCULOSKELETAL:  No edema; No deformity  SKIN: Warm and dry NEUROLOGIC:  Alert and  oriented x 3 PSYCHIATRIC:  Normal affect    Signed, Shirlee More, MD  09/10/2021 9:42 AM    [place]

## 2021-09-11 LAB — CBC
Hematocrit: 41.5 % (ref 34.0–46.6)
Hemoglobin: 13.8 g/dL (ref 11.1–15.9)
MCH: 29.9 pg (ref 26.6–33.0)
MCHC: 33.3 g/dL (ref 31.5–35.7)
MCV: 90 fL (ref 79–97)
Platelets: 249 10*3/uL (ref 150–450)
RBC: 4.62 x10E6/uL (ref 3.77–5.28)
RDW: 12.6 % (ref 11.7–15.4)
WBC: 5.8 10*3/uL (ref 3.4–10.8)

## 2021-09-11 LAB — LIPID PANEL
Chol/HDL Ratio: 3.5 ratio (ref 0.0–4.4)
Cholesterol, Total: 172 mg/dL (ref 100–199)
HDL: 49 mg/dL (ref >39)
LDL Chol Calc (NIH): 97 mg/dL (ref 0–99)
Triglycerides: 150 mg/dL — ABNORMAL HIGH (ref 0–149)
VLDL Cholesterol Cal: 26 mg/dL (ref 5–40)

## 2021-09-11 LAB — PRO B NATRIURETIC PEPTIDE: NT-Pro BNP: 128 pg/mL (ref 0–301)

## 2021-09-27 DIAGNOSIS — R112 Nausea with vomiting, unspecified: Secondary | ICD-10-CM | POA: Diagnosis not present

## 2021-09-27 DIAGNOSIS — R5383 Other fatigue: Secondary | ICD-10-CM | POA: Diagnosis not present

## 2021-09-27 DIAGNOSIS — R197 Diarrhea, unspecified: Secondary | ICD-10-CM | POA: Diagnosis not present

## 2021-09-27 DIAGNOSIS — Z03818 Encounter for observation for suspected exposure to other biological agents ruled out: Secondary | ICD-10-CM | POA: Diagnosis not present

## 2021-10-01 ENCOUNTER — Other Ambulatory Visit: Payer: Self-pay

## 2021-10-01 ENCOUNTER — Ambulatory Visit (HOSPITAL_BASED_OUTPATIENT_CLINIC_OR_DEPARTMENT_OTHER)
Admission: RE | Admit: 2021-10-01 | Discharge: 2021-10-01 | Disposition: A | Discharge status: Home / Self Care | Payer: PPO | Source: Ambulatory Visit | Attending: Cardiology | Admitting: Cardiology

## 2021-10-01 DIAGNOSIS — R0602 Shortness of breath: Secondary | ICD-10-CM | POA: Diagnosis not present

## 2021-10-01 LAB — ECHOCARDIOGRAM COMPLETE
AR max vel: 1.59 cm2
AV Area VTI: 1.6 cm2
AV Area mean vel: 1.6 cm2
AV Mean grad: 10 mmHg
AV Peak grad: 20.4 mmHg
Ao pk vel: 2.26 m/s
Area-P 1/2: 2.71 cm2
S' Lateral: 3.2 cm

## 2021-10-01 NOTE — Progress Notes (Signed)
°  Echocardiogram 2D Echocardiogram has been performed.  [name] F 10/01/2021, 10:22 AM

## 2021-10-05 ENCOUNTER — Telehealth: Payer: Self-pay

## 2021-10-05 DIAGNOSIS — K219 Gastro-esophageal reflux disease without esophagitis: Secondary | ICD-10-CM | POA: Diagnosis not present

## 2021-10-05 DIAGNOSIS — Z8679 Personal history of other diseases of the circulatory system: Secondary | ICD-10-CM | POA: Diagnosis not present

## 2021-10-05 DIAGNOSIS — Z86010 Personal history of colonic polyps: Secondary | ICD-10-CM | POA: Diagnosis not present

## 2021-10-05 NOTE — Telephone Encounter (Signed)
° °  Pre-operative Risk Assessment    Patient Name: [name]  DOB: 01-08-1946 MRN: [id number]      Request for Surgical Clearance    Procedure:   Colonoscopy  Date of Surgery:  Clearance 11/08/21                                 Surgeon:  Dr. Alessandra Bevels Surgeon's Group or Practice Name:  [facility] Phone number:  [phone] Fax number:  [phone]   Type of Clearance Requested:   - Medical  - Pharmacy:  Hold Apixaban (Eliquis) 2 days prior   Type of Anesthesia:   Unspecified   Additional requests/questions:   None noted  Signed, Gita Kudo   10/05/2021, 4:32 PM

## 2021-10-06 NOTE — Telephone Encounter (Signed)
Patient with diagnosis of afib on Eliquis for anticoagulation.    Procedure: colonoscopy Date of procedure: 11/08/21  CHA2DS2-VASc Score = 3  This indicates a 3.2% annual risk of stroke. The patient's score is based upon: CHF History: 0 HTN History: 1 Diabetes History: 0 Stroke History: 0 Vascular Disease History: 0 Age Score: 1 Gender Score: 1   CrCl 56mL/min using adjusted body weight Platelet count 249K  Per office protocol, patient can hold Eliquis for 2 days prior to procedure as requested.

## 2021-10-07 ENCOUNTER — Other Ambulatory Visit: Payer: Self-pay | Admitting: Cardiology

## 2021-10-07 NOTE — Telephone Encounter (Signed)
Left message for the patient to call back and speak to the on-call preop APP of the day 

## 2021-10-08 NOTE — Telephone Encounter (Signed)
Called patient, unable to LVM.

## 2021-10-08 NOTE — Telephone Encounter (Signed)
Prescription refill request for Eliquis received. Indication:Afib Last office visit:1/23 VVZ:[facility] Age: 76 Weight:105.6 kg  Prescription refilled

## 2021-10-13 ENCOUNTER — Other Ambulatory Visit: Payer: Self-pay | Admitting: Family Medicine

## 2021-10-13 DIAGNOSIS — Z1231 Encounter for screening mammogram for malignant neoplasm of breast: Secondary | ICD-10-CM

## 2021-10-13 NOTE — Telephone Encounter (Signed)
Covering preop today. Tried to call pt but goes right to VM which is full. Will try to send MyChart message requesting call back.

## 2021-10-15 ENCOUNTER — Ambulatory Visit
Admission: RE | Admit: 2021-10-15 | Discharge: 2021-10-15 | Disposition: A | Discharge status: Home / Self Care | Payer: PPO | Source: Ambulatory Visit | Attending: Family Medicine | Admitting: Family Medicine

## 2021-10-15 DIAGNOSIS — H25813 Combined forms of age-related cataract, bilateral: Secondary | ICD-10-CM | POA: Diagnosis not present

## 2021-10-15 DIAGNOSIS — N951 Menopausal and female climacteric states: Secondary | ICD-10-CM | POA: Diagnosis not present

## 2021-10-15 DIAGNOSIS — E039 Hypothyroidism, unspecified: Secondary | ICD-10-CM | POA: Diagnosis not present

## 2021-10-15 DIAGNOSIS — Z1231 Encounter for screening mammogram for malignant neoplasm of breast: Secondary | ICD-10-CM | POA: Diagnosis not present

## 2021-10-15 DIAGNOSIS — H5203 Hypermetropia, bilateral: Secondary | ICD-10-CM | POA: Diagnosis not present

## 2021-10-15 DIAGNOSIS — H524 Presbyopia: Secondary | ICD-10-CM | POA: Diagnosis not present

## 2021-10-15 DIAGNOSIS — H35371 Puckering of macula, right eye: Secondary | ICD-10-CM | POA: Diagnosis not present

## 2021-10-18 NOTE — Telephone Encounter (Signed)
° °  Patient Name: [name]  DOB: 03-20-1946 MRN: [id number]  Primary Cardiologist: Shirlee More, MD  Chart reviewed as part of pre-operative protocol coverage. I reached out to patient for update on how she is doing. I was able to reach her by phone this time. I did let her know her VM box was full. The patient affirms she has been doing well without any new cardiac symptoms. She is no longer having any problems at this time. She believes prior issues were related to stress. Therefore, based on ACC/AHA guidelines, the patient would be at acceptable risk for the planned procedure without further cardiovascular testing. The patient was advised that if she develops new symptoms prior to surgery to contact our office to arrange for a follow-up visit, and she verbalized understanding.  Per pharmD, "Per office protocol, patient can hold Eliquis for 2 days prior to procedure as requested.  "  Will route this bundled recommendation to requesting provider via Epic fax function. Please call with questions.  Charlie Pitter, PA-C 10/18/2021, 10:17 AM

## 2021-10-27 DIAGNOSIS — H2513 Age-related nuclear cataract, bilateral: Secondary | ICD-10-CM | POA: Diagnosis not present

## 2021-10-27 DIAGNOSIS — H35371 Puckering of macula, right eye: Secondary | ICD-10-CM | POA: Diagnosis not present

## 2021-10-29 DIAGNOSIS — R232 Flushing: Secondary | ICD-10-CM | POA: Diagnosis not present

## 2021-10-29 DIAGNOSIS — N951 Menopausal and female climacteric states: Secondary | ICD-10-CM | POA: Diagnosis not present

## 2021-10-29 DIAGNOSIS — R454 Irritability and anger: Secondary | ICD-10-CM | POA: Diagnosis not present

## 2021-10-29 DIAGNOSIS — Z6841 Body Mass Index (BMI) 40.0 and over, adult: Secondary | ICD-10-CM | POA: Diagnosis not present

## 2021-11-08 DIAGNOSIS — K635 Polyp of colon: Secondary | ICD-10-CM | POA: Diagnosis not present

## 2021-11-08 DIAGNOSIS — K648 Other hemorrhoids: Secondary | ICD-10-CM | POA: Diagnosis not present

## 2021-11-08 DIAGNOSIS — Z86010 Personal history of colonic polyps: Secondary | ICD-10-CM | POA: Diagnosis not present

## 2021-11-08 DIAGNOSIS — K5730 Diverticulosis of large intestine without perforation or abscess without bleeding: Secondary | ICD-10-CM | POA: Diagnosis not present

## 2021-11-08 DIAGNOSIS — K6389 Other specified diseases of intestine: Secondary | ICD-10-CM | POA: Diagnosis not present

## 2021-11-10 DIAGNOSIS — K635 Polyp of colon: Secondary | ICD-10-CM | POA: Diagnosis not present

## 2021-11-16 DIAGNOSIS — J069 Acute upper respiratory infection, unspecified: Secondary | ICD-10-CM | POA: Diagnosis not present

## 2021-11-20 ENCOUNTER — Other Ambulatory Visit: Payer: Self-pay | Admitting: Cardiology

## 2021-11-23 DIAGNOSIS — H25811 Combined forms of age-related cataract, right eye: Secondary | ICD-10-CM | POA: Diagnosis not present

## 2021-11-23 DIAGNOSIS — H2511 Age-related nuclear cataract, right eye: Secondary | ICD-10-CM | POA: Diagnosis not present

## 2021-11-23 DIAGNOSIS — H25011 Cortical age-related cataract, right eye: Secondary | ICD-10-CM | POA: Diagnosis not present

## 2021-12-31 DIAGNOSIS — D1800 Hemangioma unspecified site: Secondary | ICD-10-CM | POA: Diagnosis not present

## 2021-12-31 DIAGNOSIS — L821 Other seborrheic keratosis: Secondary | ICD-10-CM | POA: Diagnosis not present

## 2022-01-04 DIAGNOSIS — H25012 Cortical age-related cataract, left eye: Secondary | ICD-10-CM | POA: Diagnosis not present

## 2022-01-04 DIAGNOSIS — H25812 Combined forms of age-related cataract, left eye: Secondary | ICD-10-CM | POA: Diagnosis not present

## 2022-01-04 DIAGNOSIS — H2512 Age-related nuclear cataract, left eye: Secondary | ICD-10-CM | POA: Diagnosis not present

## 2022-01-06 DIAGNOSIS — H57813 Brow ptosis, bilateral: Secondary | ICD-10-CM | POA: Diagnosis not present

## 2022-01-06 DIAGNOSIS — R234 Changes in skin texture: Secondary | ICD-10-CM | POA: Diagnosis not present

## 2022-01-06 DIAGNOSIS — H02834 Dermatochalasis of left upper eyelid: Secondary | ICD-10-CM | POA: Diagnosis not present

## 2022-01-06 DIAGNOSIS — H02831 Dermatochalasis of right upper eyelid: Secondary | ICD-10-CM | POA: Diagnosis not present

## 2022-01-06 DIAGNOSIS — H02413 Mechanical ptosis of bilateral eyelids: Secondary | ICD-10-CM | POA: Diagnosis not present

## 2022-01-20 ENCOUNTER — Other Ambulatory Visit: Payer: Self-pay | Admitting: Cardiology

## 2022-01-21 NOTE — Telephone Encounter (Signed)
Prescription refill request for Eliquis received. Indication:Afib Last office visit:1/23 ULG:[facility] Age: 76 Weight:105.6 kg  Prescription refilled

## 2022-05-20 ENCOUNTER — Telehealth: Payer: Self-pay | Admitting: Cardiology

## 2022-05-20 NOTE — Telephone Encounter (Signed)
Patient stated she and her husband Dewitt Rota) would like to switch provider from Dr. Bettina Gavia in [place] to a cardiologist in [place].  Patient would like referral to cardiologist in [place].

## 2022-05-20 NOTE — Telephone Encounter (Signed)
Attempted to call patient. Unable to leave voicemail.  

## 2022-05-24 ENCOUNTER — Other Ambulatory Visit: Payer: Self-pay

## 2022-05-24 DIAGNOSIS — I480 Paroxysmal atrial fibrillation: Secondary | ICD-10-CM

## 2022-05-24 NOTE — Telephone Encounter (Signed)
Called patient and she would like to switch from Dr. Bettina Gavia to Dr. Harriet Masson over at the [facility] location. A referral to Dr. Harriet Masson was placed in [place]. Patient had no further questions at this time.

## 2022-06-08 ENCOUNTER — Telehealth: Payer: Self-pay | Admitting: Family Medicine

## 2022-06-08 NOTE — Telephone Encounter (Signed)
Pt States: -[facility] manager to call her back about credentialing of provider with insurance.  Pt acknowledges Scientist, physiological is working on missing link between Air traffic controller and insurance company.   Pt states she will keep appointment.

## 2022-06-10 ENCOUNTER — Ambulatory Visit: Payer: PPO | Admitting: Internal Medicine

## 2022-06-10 ENCOUNTER — Ambulatory Visit (INDEPENDENT_AMBULATORY_CARE_PROVIDER_SITE_OTHER): Payer: PPO | Admitting: Internal Medicine

## 2022-06-10 ENCOUNTER — Encounter: Payer: Self-pay | Admitting: Internal Medicine

## 2022-06-10 DIAGNOSIS — E559 Vitamin D deficiency, unspecified: Secondary | ICD-10-CM | POA: Diagnosis not present

## 2022-06-10 DIAGNOSIS — R110 Nausea: Secondary | ICD-10-CM

## 2022-06-10 DIAGNOSIS — I4891 Unspecified atrial fibrillation: Secondary | ICD-10-CM | POA: Diagnosis not present

## 2022-06-10 DIAGNOSIS — I471 Supraventricular tachycardia, unspecified: Secondary | ICD-10-CM | POA: Diagnosis not present

## 2022-06-10 DIAGNOSIS — R195 Other fecal abnormalities: Secondary | ICD-10-CM | POA: Diagnosis not present

## 2022-06-10 DIAGNOSIS — I10 Essential (primary) hypertension: Secondary | ICD-10-CM

## 2022-06-10 DIAGNOSIS — T50905A Adverse effect of unspecified drugs, medicaments and biological substances, initial encounter: Secondary | ICD-10-CM

## 2022-06-10 DIAGNOSIS — M7989 Other specified soft tissue disorders: Secondary | ICD-10-CM | POA: Diagnosis not present

## 2022-06-10 DIAGNOSIS — K219 Gastro-esophageal reflux disease without esophagitis: Secondary | ICD-10-CM

## 2022-06-10 DIAGNOSIS — F419 Anxiety disorder, unspecified: Secondary | ICD-10-CM

## 2022-06-10 DIAGNOSIS — I480 Paroxysmal atrial fibrillation: Secondary | ICD-10-CM

## 2022-06-10 DIAGNOSIS — Z7901 Long term (current) use of anticoagulants: Secondary | ICD-10-CM | POA: Diagnosis not present

## 2022-06-10 DIAGNOSIS — E876 Hypokalemia: Secondary | ICD-10-CM | POA: Diagnosis not present

## 2022-06-10 DIAGNOSIS — R062 Wheezing: Secondary | ICD-10-CM

## 2022-06-10 HISTORY — DX: Morbid (severe) obesity due to excess calories: E66.01

## 2022-06-10 LAB — HEMOGLOBIN A1C: Hgb A1c MFr Bld: 5.4 % (ref 4.6–6.5)

## 2022-06-10 LAB — CBC
HCT: 40.2 % (ref 36.0–46.0)
Hemoglobin: 13.5 g/dL (ref 12.0–15.0)
MCHC: 33.5 g/dL (ref 30.0–36.0)
MCV: 92.1 fl (ref 78.0–100.0)
Platelets: 231 10*3/uL (ref 150.0–400.0)
RBC: 4.36 Mil/uL (ref 3.87–5.11)
RDW: 13.5 % (ref 11.5–15.5)
WBC: 6.5 10*3/uL (ref 4.0–10.5)

## 2022-06-10 LAB — COMPREHENSIVE METABOLIC PANEL
ALT: 19 U/L (ref 0–35)
AST: 25 U/L (ref 0–37)
Albumin: 4 g/dL (ref 3.5–5.2)
Alkaline Phosphatase: 100 U/L (ref 39–117)
BUN: 17 mg/dL (ref 6–23)
CO2: 31 mEq/L (ref 19–32)
Calcium: 9.5 mg/dL (ref 8.4–10.5)
Chloride: 99 mEq/L (ref 96–112)
Creatinine, Ser: 0.78 mg/dL (ref 0.40–1.20)
GFR: 74.77 mL/min (ref >60.00)
Glucose, Bld: 77 mg/dL (ref 70–99)
Potassium: 3.8 mEq/L (ref 3.5–5.1)
Sodium: 140 mEq/L (ref 135–145)
Total Bilirubin: 0.5 mg/dL (ref 0.2–1.2)
Total Protein: 7 g/dL (ref 6.0–8.3)

## 2022-06-10 LAB — LIPID PANEL
Cholesterol: 166 mg/dL (ref 0–200)
HDL: 48.7 mg/dL (ref >39.00)
LDL Cholesterol: 94 mg/dL (ref 0–99)
NonHDL: 117.68
Total CHOL/HDL Ratio: 3
Triglycerides: 118 mg/dL (ref 0.0–149.0)
VLDL: 23.6 mg/dL (ref 0.0–40.0)

## 2022-06-10 LAB — TSH: TSH: 3.97 u[IU]/mL (ref 0.35–5.50)

## 2022-06-10 LAB — MAGNESIUM: Magnesium: 1.9 mg/dL (ref 1.5–2.5)

## 2022-06-10 MED ORDER — POTASSIUM CHLORIDE ER 10 MEQ PO TBCR: 10.0000 meq | EXTENDED_RELEASE_TABLET | ORAL | 3 refills | Status: DC

## 2022-06-10 MED ORDER — APIXABAN 5 MG PO TABS: 5.0000 mg | ORAL_TABLET | Freq: Two times a day (BID) | ORAL | 3 refills | Status: DC

## 2022-06-10 MED ORDER — ALBUTEROL SULFATE HFA 108 (90 BASE) MCG/ACT IN AERS: INHALATION_SPRAY | RESPIRATORY_TRACT | 11 refills | Status: DC

## 2022-06-10 MED ORDER — SEMAGLUTIDE-WEIGHT MANAGEMENT 0.25 MG/0.5ML ~~LOC~~ SOAJ: 0.2500 mg | SUBCUTANEOUS | 0 refills | Status: AC

## 2022-06-10 MED ORDER — LOSARTAN POTASSIUM-HCTZ 50-12.5 MG PO TABS: 1.0000 | ORAL_TABLET | Freq: Every day | ORAL | 3 refills | Status: DC

## 2022-06-10 MED ORDER — SEMAGLUTIDE-WEIGHT MANAGEMENT 2.4 MG/0.75ML ~~LOC~~ SOAJ: 2.4000 mg | SUBCUTANEOUS | 0 refills | Status: AC

## 2022-06-10 MED ORDER — SEMAGLUTIDE-WEIGHT MANAGEMENT 0.5 MG/0.5ML ~~LOC~~ SOAJ: 0.5000 mg | SUBCUTANEOUS | 0 refills | Status: AC

## 2022-06-10 MED ORDER — VITAMIN D (ERGOCALCIFEROL) 1.25 MG (50000 UNIT) PO CAPS: ORAL_CAPSULE | ORAL | 11 refills | Status: DC

## 2022-06-10 MED ORDER — SEMAGLUTIDE-WEIGHT MANAGEMENT 1.7 MG/0.75ML ~~LOC~~ SOAJ: 1.7000 mg | SUBCUTANEOUS | 0 refills | Status: AC

## 2022-06-10 MED ORDER — PANTOPRAZOLE SODIUM 40 MG PO TBEC: 40.0000 mg | DELAYED_RELEASE_TABLET | Freq: Every day | ORAL | 3 refills | Status: DC | PRN

## 2022-06-10 MED ORDER — FUROSEMIDE 20 MG PO TABS: 20.0000 mg | ORAL_TABLET | ORAL | 3 refills | Status: DC

## 2022-06-10 MED ORDER — ONDANSETRON 4 MG PO TBDP: 4.0000 mg | ORAL_TABLET | Freq: Three times a day (TID) | ORAL | 0 refills | Status: DC | PRN

## 2022-06-10 MED ORDER — VERAPAMIL HCL 80 MG PO TABS: 80.0000 mg | ORAL_TABLET | Freq: Two times a day (BID) | ORAL | 2 refills | Status: DC

## 2022-06-10 MED ORDER — SEMAGLUTIDE-WEIGHT MANAGEMENT 1 MG/0.5ML ~~LOC~~ SOAJ: 1.0000 mg | SUBCUTANEOUS | 0 refills | Status: AC

## 2022-06-10 NOTE — Patient Instructions (Addendum)
It was a pleasure seeing you today!  Today the plan is...  Nausea -     Ondansetron; Take 1 tablet (4 mg total) by mouth every 8 (eight) hours as needed for nausea or vomiting (for nausea from wegovy or other source).  Dispense: 20 tablet; Refill: 0  Anxiety  Morbid obesity ([place]) Assessment & Plan: I have had an extensive 10 minute conversation today with the patient about healthy eating habits, exercise, calorie and carb goals for sustainable and successful weight loss. I gave the patient caloric and protein daily intake values as well as described the importance of increasing fiber and water intake. I discussed weight loss medications that could be used in the treatment of this patient. Handouts on low carb eating were given to the patient.   Used to follow with [facility] encouraged her to return to her 70gcarb 800cal diet.    Orders: -     Semaglutide-Weight Management; Inject 0.25 mg into the skin once a week for 28 days.  Dispense: 2 mL; Refill: 0 -     Semaglutide-Weight Management; Inject 0.5 mg into the skin once a week for 28 days.  Dispense: 2 mL; Refill: 0 -     Semaglutide-Weight Management; Inject 1 mg into the skin once a week for 28 days.  Dispense: 2 mL; Refill: 0 -     Semaglutide-Weight Management; Inject 1.7 mg into the skin once a week for 28 days.  Dispense: 3 mL; Refill: 0 -     Semaglutide-Weight Management; Inject 2.4 mg into the skin once a week for 28 days.  Dispense: 3 mL; Refill: 0 -     TSH -     Lipid panel -     Hemoglobin A1c  Fecal occult blood test positive  Chronic anticoagulation  PAF (paroxysmal atrial fibrillation) (HCC)  SVT (supraventricular tachycardia)  Hypertension, unspecified type -     Losartan Potassium-HCTZ; Take 1 tablet by mouth daily.  Dispense: 90 tablet; Refill: 3  Atrial fibrillation, unspecified type (HCC) -     Verapamil HCl; Take 1 tablet (80 mg total) by mouth 2 (two) times daily.  Dispense: 180 tablet; Refill: 2 -      Apixaban; Take 1 tablet (5 mg total) by mouth 2 (two) times daily.  Dispense: 180 tablet; Refill: 3 -     Magnesium -     TSH -     CBC -     Comprehensive metabolic panel -     Lipid panel  Leg swelling -     Furosemide; Take 1 tablet (20 mg total) by mouth 3 (three) times a week. As needed for fluid  Dispense: 45 tablet; Refill: 3  Vitamin D deficiency -     Vitamin D (Ergocalciferol); TAKE 1 CAPSULE BY MOUTH 2 TIMES A WEEK  Dispense: 5 capsule; Refill: 11  Drug-induced hypokalemia -     Potassium Chloride ER; Take 1 tablet (10 mEq total) by mouth 3 (three) times a week.  Dispense: 45 tablet; Refill: 3  Gastroesophageal reflux disease, unspecified whether esophagitis present -     Pantoprazole Sodium; Take 1 tablet (40 mg total) by mouth daily as needed (acid reflux).  Dispense: 90 tablet; Refill: 3  Wheezing -     Albuterol Sulfate HFA; ProAir HFA 90 mcg/actuation aerosol inhaler  INHALE 1 TO 2 PUFFS EVERY 4 TO 6 HOURS AS NEEDED 30 DAYS  Dispense: 2 each; Refill: 11     Loralee Pacas, MD  Return in about 6 months (around 12/10/2022) for review response to recent changes, Annual Exam medicare initial AWV.   - If your condition fails to resolve or you have other questions / concerns: please contact me via phone [phone] or MyChart messaging.  - Please bring all your medicines to your next appointment. This is the best way for me to know exactly what you're taking.  - If your condition begins to worsen or become severe:  go to the ER.   IF you received an x-ray today, you will receive an invoice from [facility]. Please contact [facility] at [phone] with questions or concerns regarding your invoice.    IF you received labwork today, you will receive an invoice from [facility]. Please contact [facility] at [phone] with questions or concerns regarding your invoice.    Our billing staff will not be able to assist you with questions regarding bills  from these companies.   --------------------------------------------------------------------------------------------------------------------  You will be contacted with the lab results as soon as they are available. The fastest way to get your results is to activate your My Chart account. Instructions are located on the last page of this paperwork. If you have not heard from [place] regarding the results in 2 weeks, please contact this office. For any labs or imaging tests, we will call you if the results are significantly abnormal.  Most normal results will be posted to myChart as soon as they are available and I will comment on them there within 2-3 business days.

## 2022-06-10 NOTE — Assessment & Plan Note (Signed)
I have had an extensive 10 minute conversation today with the patient about healthy eating habits, exercise, calorie and carb goals for sustainable and successful weight loss. I gave the patient caloric and protein daily intake values as well as described the importance of increasing fiber and water intake. I discussed weight loss medications that could be used in the treatment of this patient. Handouts on low carb eating were given to the patient.   Used to follow with [facility] encouraged her to return to her 70gcarb 800cal diet.

## 2022-06-10 NOTE — Progress Notes (Signed)
Today's healthcare provider: Loralee Pacas, MD  Phone: [phone]  New patient visit  Visit Date: 06/10/2022 Patient: [name]   DOB: 1946/02/05   76 y.o. Female  MRN: [id number]  Assessment and Plan:   This visit we mainly just focused on getting an accurate chart arranged and hopefully getting her on some weight loss medicine [name] was seen today for new patient (initial visit).  Morbid obesity ([name]) Assessment & Plan: I have had an extensive 10 minute conversation today with the patient about healthy eating habits, exercise, calorie and carb goals for sustainable and successful weight loss. I gave the patient caloric and protein daily intake values as well as described the importance of increasing fiber and water intake. I discussed weight loss medications that could be used in the treatment of this patient. Handouts on low carb eating were given to the patient.   Used to follow with [facility] encouraged her to return to her 70gcarb 800cal diet.    Orders: -     Semaglutide-Weight Management; Inject 0.25 mg into the skin once a week for 28 days.  Dispense: 2 mL; Refill: 0 -     Semaglutide-Weight Management; Inject 0.5 mg into the skin once a week for 28 days.  Dispense: 2 mL; Refill: 0 -     Semaglutide-Weight Management; Inject 1 mg into the skin once a week for 28 days.  Dispense: 2 mL; Refill: 0 -     Semaglutide-Weight Management; Inject 1.7 mg into the skin once a week for 28 days.  Dispense: 3 mL; Refill: 0 -     Semaglutide-Weight Management; Inject 2.4 mg into the skin once a week for 28 days.  Dispense: 3 mL; Refill: 0 -     TSH -     Lipid panel -     Hemoglobin A1c  Anxiety  Nausea -     Ondansetron; Take 1 tablet (4 mg total) by mouth every 8 (eight) hours as needed for nausea or vomiting (for nausea from wegovy or other source).  Dispense: 20 tablet; Refill: 0  Fecal occult blood test positive Overview: Was attributed probably to hemorrhoids she had 2  colonoscopies which just found some polyps but no source for the bleeding leaving and on the problem list sick remind myself if I ever get a positive blood test that is not worth worrying about   Chronic anticoagulation Overview: For afib   PAF (paroxysmal atrial fibrillation) (HCC)  SVT (supraventricular tachycardia)  Hypertension, unspecified type -     Losartan Potassium-HCTZ; Take 1 tablet by mouth daily.  Dispense: 90 tablet; Refill: 3  Atrial fibrillation, unspecified type (HCC) -     Verapamil HCl; Take 1 tablet (80 mg total) by mouth 2 (two) times daily.  Dispense: 180 tablet; Refill: 2 -     Apixaban; Take 1 tablet (5 mg total) by mouth 2 (two) times daily.  Dispense: 180 tablet; Refill: 3 -     Magnesium -     TSH -     CBC -     Comprehensive metabolic panel -     Lipid panel  Leg swelling -     Furosemide; Take 1 tablet (20 mg total) by mouth 3 (three) times a week. As needed for fluid  Dispense: 45 tablet; Refill: 3  Vitamin D deficiency -     Vitamin D (Ergocalciferol); TAKE 1 CAPSULE BY MOUTH 2 TIMES A WEEK  Dispense: 5 capsule; Refill: 11  Drug-induced hypokalemia -  Potassium Chloride ER; Take 1 tablet (10 mEq total) by mouth 3 (three) times a week.  Dispense: 45 tablet; Refill: 3  Gastroesophageal reflux disease, unspecified whether esophagitis present -     Pantoprazole Sodium; Take 1 tablet (40 mg total) by mouth daily as needed (acid reflux).  Dispense: 90 tablet; Refill: 3  Wheezing -     Albuterol Sulfate HFA; ProAir HFA 90 mcg/actuation aerosol inhaler  INHALE 1 TO 2 PUFFS EVERY 4 TO 6 HOURS AS NEEDED 30 DAYS  Dispense: 2 each; Refill: 11       Health Maintenance  Topic Date Due   Hepatitis C Screening  Never done   TETANUS/TDAP  Never done   Pneumonia Vaccine 31+ Years old (1 - PCV) Never done   DEXA SCAN  Never done   INFLUENZA VACCINE  04/05/2022   COVID-19 Vaccine (3 - Mixed Product series) 06/26/2022 (Originally 12/05/2019)   MAMMOGRAM   10/16/2023   COLONOSCOPY (Pts 45-66yr Insurance coverage will need to be confirmed)  01/03/2031   Zoster Vaccines- Shingrix  Completed   HPV VACCINES  Aged Out       Recommended follow up: Return in about 6 months (around 12/10/2022) for review response to recent changes, Annual Exam medicare initial AWV.   Subjective:  Patient presents today to establish care.  Prior patient of LKathyrn Clark  Difficulty getting in due to hours and availability so planning to change but I advised I am okay with if she needs to bounce back and forth if that works for her any better.  Chief Complaint  Patient presents with   New Patient (Initial Visit)    For history taking, I took a per problem history from the patient and chart review as follows: Problem  Anxiety  Morbid Obesity (Hcc)  Pain of Right Shoulder Joint On Movement  Hypertensive Disorder  Atrial Fibrillation (Hcc)  Chronic Anticoagulation   For afib   Fecal Occult Blood Test Positive   Was attributed probably to hemorrhoids she had 2 colonoscopies which just found some polyps but no source for the bleeding leaving and on the problem list sick remind myself if I ever get a positive blood test that is not worth worrying about   Hypertensive Heart Disease Without Heart Failure (Resolved)  Chaotic Atrial Rhythm (Resolved)      The following were reviewed and entered/updated in epic: Past Medical History:  Diagnosis Date   Atrial fibrillation ([place]    Cervical radiculopathy 02/23/2018   Chaotic atrial rhythm 10/19/2018   Chronic anticoagulation 01/24/2019   DDD (degenerative disc disease), cervical 03/23/2018   Fecal occult blood test positive 12/06/2018   Heart murmur    --innocent   Hypertension    Hypertensive heart disease without heart failure 12/10/2018   [place] 06/10/2022   PAF (paroxysmal atrial fibrillation) ([place] 12/10/2018   SVT (supraventricular tachycardia) 04/24/2019   Past Surgical History:  Procedure  Laterality Date   ABDOMINAL HYSTERECTOMY  03/2010   TAH/BSO   [place]  ENDOMETRIAL BIOPSY     05-26-09--benign, 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   ROTATOR CUFF REPAIR Left 2010   TOTAL KNEE ARTHROPLASTY Bilateral    Past Surgical History:  Procedure Laterality Date   ABDOMINAL HYSTERECTOMY  03/2010   TAH/BSO   CESAREAN SECTION  1981   ENDOMETRIAL BIOPSY     05-26-09--benign, 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   ROTATOR CUFF REPAIR Left 2010   TOTAL KNEE ARTHROPLASTY Bilateral    Family Status  Relation Name Status   Mother  Deceased   Father  Deceased   MGF  Deceased   PGM  Deceased   PGF  Deceased   Pat Aunt 3 Deceased   Ethlyn Daniels  (Not Specified)   Other  (Not Specified)   Daughter Darreld Mclean Alive   Daughter Nathifa Ritthaler Alive   Daughter Herma Mering Deceased   Family History  Problem Relation Age of Onset   Breast cancer Mother    Heart attack Maternal Grandfather    Breast cancer Paternal Aunt    Stroke Paternal 37    Breast cancer Paternal 56    Stroke Other    Depression Daughter    ADD / ADHD Daughter    Depression Daughter    ADD / ADHD Daughter    Depression Daughter    Outpatient Medications Prior to Visit  Medication Sig Dispense Refill   albuterol (VENTOLIN HFA) 108 (90 Base) MCG/ACT inhaler ProAir HFA 90 mcg/actuation aerosol inhaler  INHALE 1 TO 2 PUFFS EVERY 4 TO 6 HOURS AS NEEDED 30 DAYS     apixaban (ELIQUIS) 5 MG TABS tablet TAKE 1 TABLET BY MOUTH TWICE A DAY 180 tablet 1   losartan-hydrochlorothiazide (HYZAAR) 50-12.5 MG tablet Take 1 tablet by mouth daily. 90 tablet 3   pantoprazole (PROTONIX) 40 MG tablet Take 40 mg by mouth daily as needed (acid reflux).     potassium chloride (KLOR-CON) 10 MEQ tablet Take 1 tablet (10 mEq total) by mouth 3 (three) times a week. 45 tablet 3   verapamil (CALAN) 80 MG tablet Take 1 tablet (80 mg total) by mouth 2 (two) times daily. 180 tablet 2   Vitamin D, Ergocalciferol, (DRISDOL)  1.25 MG (50000 UT) CAPS capsule TAKE 1 CAPSULE BY MOUTH 2 TIMES A WEEK     furosemide (LASIX) 20 MG tablet Take 1 tablet (20 mg total) by mouth 3 (three) times a week. As needed for fluid 45 tablet 3   No facility-administered medications prior to visit.    Allergies  Allergen Reactions   Codeine Nausea And Vomiting   Sulfa Antibiotics Rash   Social History   Tobacco Use   Smoking status: Never   Smokeless tobacco: Never  Vaping Use   Vaping Use: Never used  Substance Use Topics   Alcohol use: Yes    Comment: occ wine   Drug use: Never    Immunization History  Administered Date(s) Administered   Influenza-Unspecified 05/06/2014   Zoster Recombinat (Shingrix) 01/23/2019, 04/26/2019      Objective:  BP 133/84 (BP Location: Left Arm, Patient Position: Sitting)   Pulse 78   Temp 98 F (36.7 C) (Temporal)   Ht '5\' 2"'$  (1.575 m)   Wt 231 lb 3.2 oz (104.9 kg)   SpO2 94%   BMI 42.29 kg/m  Body mass index is 42.29 kg/m.  She  is a very cordial and polite person who was a pleasure to meet.  Gen: NAD, resting comfortably, overweight HEENT: Mucous membranes are moist. Sclera conjunctiva and lids grossly normal Neck: no thyromegaly, no cervical lymphadenopathyExt: no edema Skin: warm, dry Neuro: grossly intact Results for orders placed or performed during the hospital encounter of 10/01/21  ECHOCARDIOGRAM COMPLETE  Result Value Ref Range   S' Lateral 3.20 cm   Area-P 1/2 2.71 cm2   Ao pk vel 2.26 m/s   AR max vel 1.59 cm2   AV Peak grad 20.4 mmHg   AV Area VTI 1.60 cm2  AV Area mean vel 1.60 cm2   AV Mean grad 10.0 mmHg

## 2022-06-24 ENCOUNTER — Ambulatory Visit: Payer: PPO | Admitting: Internal Medicine

## 2022-06-24 DIAGNOSIS — Z96652 Presence of left artificial knee joint: Secondary | ICD-10-CM | POA: Diagnosis not present

## 2022-07-01 ENCOUNTER — Ambulatory Visit: Payer: PPO | Admitting: Cardiology

## 2022-07-07 NOTE — Progress Notes (Signed)
Cardiology Office Note:    Date:  07/08/2022   ID:  [name], DOB 04-21-1946, MRN [id number]  PCP:  Loralee Pacas, MD  Cardiologist:  Shirlee More, MD    Referring MD: Kathyrn Lass, MD    ASSESSMENT:    1. PAF (paroxysmal atrial fibrillation) ([place])   2. Chronic anticoagulation   3. Hypertensive heart disease without heart failure    PLAN:    In order of problems listed above:  Stable maintaining sinus rhythm she will continue her rate limiting calcium channel blocker that is helped with her atrial arrhythmia and her current anticoagulant BP at target continue current treatment she rarely takes a dose of the loop diuretic and she will continue her ARB thiazide diuretic   Next appointment: 6 months   Medication Adjustments/Labs and Tests Ordered: Current medicines are reviewed at length with the patient today.  Concerns regarding medicines are outlined above.  Orders Placed This Encounter  Procedures   EKG 12-Lead   No orders of the defined types were placed in this encounter.   Chief Complaint  Patient presents with   Follow-up  For paroxysmal atrial fibrillation  History of Present Illness:    [name] is a 76 y.o. female with a hx of paroxysmal atrial fibrillation with chronic anticoagulation and hypertensive heart disease last seen 09/10/2021.  She was short of breath N-terminal proBNP level was low.  Echocardiogram 10/01/2021 showed EF 60 to 65% normal right ventricular size and function moderate left atrial enlargement and no valvular abnormality.    Compliance with diet, lifestyle and medications: Yes  Unfortunately she could not get access to semaglutide for weight loss Intermittently she takes furosemide is not troubled by edema at this time She has mild shortness of breath no orthopnea chest pain palpitation or syncope She has a mobile Kardia device but has not been checking her heart rhythm and intends to purchase an Apple Watch She has  had no bleeding from her anticoagulant Past Medical History:  Diagnosis Date   Atrial fibrillation ([place])    Cervical radiculopathy 02/23/2018   Chaotic atrial rhythm 10/19/2018   Chronic anticoagulation 01/24/2019   DDD (degenerative disc disease), cervical 03/23/2018   Fecal occult blood test positive 12/06/2018   Heart murmur    --innocent   Hypertension    Hypertensive heart disease without heart failure 12/10/2018   Morbid obesity ([place]) 06/10/2022   PAF (paroxysmal atrial fibrillation) ([place]) 12/10/2018   SVT (supraventricular tachycardia) 04/24/2019    Past Surgical History:  Procedure Laterality Date   ABDOMINAL HYSTERECTOMY  03/2010   TAH/BSO   CESAREAN SECTION  1981   ENDOMETRIAL BIOPSY     05-26-09--benign, 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 ProAir HFA 90 mcg/actuation aerosol inhaler  INHALE 1 TO 2 PUFFS EVERY 4 TO 6 HOURS AS NEEDED 30 DAYS)   apixaban (ELIQUIS) 5 MG TABS tablet Take 1 tablet (5 mg total) by mouth 2 (two) times daily.   furosemide (LASIX) 20 MG tablet Take 1 tablet (20 mg total) by mouth 3 (three) times a  week. As needed for fluid   losartan-hydrochlorothiazide (HYZAAR) 50-12.5 MG tablet Take 1 tablet by mouth daily.   ondansetron (ZOFRAN-ODT) 4 MG disintegrating tablet Take 1 tablet (4 mg total) by mouth every 8 (eight) hours as needed for nausea or vomiting (for nausea from wegovy or other source).   pantoprazole (PROTONIX) 40 MG tablet Take 1 tablet (40 mg total) by mouth daily as needed (acid reflux).   potassium chloride (KLOR-CON) 10 MEQ tablet Take 1  tablet (10 mEq total) by mouth 3 (three) times a week.   verapamil (CALAN) 80 MG tablet Take 1 tablet (80 mg total) by mouth 2 (two) times daily.   Vitamin D, Ergocalciferol, (DRISDOL) 1.25 MG (50000 UNIT) CAPS capsule TAKE 1 CAPSULE BY MOUTH 2 TIMES A WEEK (Patient taking differently: Take 50,000 Units by mouth 2 (two) times a week. TAKE 1 CAPSULE BY MOUTH 2 TIMES A WEEK)     Allergies:   Codeine, Misc. sulfonamide containing compounds, and Sulfa antibiotics   Social History   Socioeconomic History   Marital status: Divorced    Spouse name: Not on file   Number of children: Not on file   Years of education: Not on file   Highest education level: Not on file  Occupational History   Not on file  Tobacco Use   Smoking status: Never   Smokeless tobacco: Never  Vaping Use   Vaping Use: Never used  Substance and Sexual Activity   Alcohol use: Yes    Comment: occ wine   Drug use: Never   Sexual activity: Never    Partners: Male    Birth control/protection: Surgical    Comment: TAH/BSO  Other Topics Concern   Not on file  Social History Narrative   Not on file   Social Determinants of Health   Financial Resource Strain: Not on file  Food Insecurity: Not on file  Transportation Needs: Not on file  Physical Activity: Not on file  Stress: Not on file  Social Connections: Not on file     Family History: The patient's family history includes ADD / ADHD in her daughter and daughter; Breast cancer in her mother, paternal aunt, and paternal aunt; Depression in her daughter, daughter, and daughter; Heart attack in her maternal grandfather; Stroke in her paternal aunt and another family member. ROS:   Please see the history of present illness.    All other systems reviewed and are negative.  EKGs/Labs/Other Studies Reviewed:    The following studies were reviewed today:  EKG:  EKG ordered today and personally reviewed.  The ekg ordered today demonstrates sinus rhythm 61 bpm  normal EKG  Recent Labs: 09/10/2021: NT-Pro BNP 128 06/10/2022: ALT 19; BUN 17; Creatinine, Ser 0.78; Hemoglobin 13.5; Magnesium 1.9; Platelets 231.0; Potassium 3.8; Sodium 140; TSH 3.97  Recent Lipid Panel    Component Value Date/Time   CHOL 166 06/10/2022 1447   CHOL 172 09/10/2021 1016   TRIG 118.0 06/10/2022 1447   HDL 48.70 06/10/2022 1447   HDL 49 09/10/2021 1016   CHOLHDL 3 06/10/2022 1447   VLDL 23.6 06/10/2022 1447   LDLCALC 94 06/10/2022 1447   LDLCALC 97 09/10/2021 1016    Physical Exam:    VS:  BP 136/72 (BP Location: Right Arm, Patient Position: Sitting)   Pulse 60   Ht '5\' 3"'$  (1.6 m)   Wt 234 lb (106.1 kg)   SpO2 94%   BMI 41.45 kg/m     Wt Readings from Last 3  Encounters:  07/08/22 234 lb (106.1 kg)  06/10/22 231 lb 3.2 oz (104.9 kg)  09/10/21 232 lb 12.8 oz (105.6 kg)     GEN: BMI exceeds 40 well nourished, well developed in no acute distress HEENT: Normal NECK: No JVD; No carotid bruits LYMPHATICS: No lymphadenopathy CARDIAC: RRR, no murmurs, rubs, gallops RESPIRATORY:  Clear to auscultation without rales, wheezing or rhonchi  ABDOMEN: Soft, non-tender, non-distended MUSCULOSKELETAL:  No edema; No deformity  SKIN: Warm and dry NEUROLOGIC:  Alert and oriented x 3 PSYCHIATRIC:  Normal affect    Signed, Shirlee More, MD  07/08/2022 10:36 AM    [place]

## 2022-07-08 ENCOUNTER — Ambulatory Visit: Payer: PPO | Attending: Cardiology | Admitting: Cardiology

## 2022-07-08 ENCOUNTER — Encounter: Payer: Self-pay | Admitting: Cardiology

## 2022-07-08 VITALS — BP 136/72 | HR 60 | Ht 63.0 in | Wt 234.0 lb

## 2022-07-08 DIAGNOSIS — Z7901 Long term (current) use of anticoagulants: Secondary | ICD-10-CM

## 2022-07-08 DIAGNOSIS — I119 Hypertensive heart disease without heart failure: Secondary | ICD-10-CM

## 2022-07-08 DIAGNOSIS — I480 Paroxysmal atrial fibrillation: Secondary | ICD-10-CM

## 2022-07-08 NOTE — Patient Instructions (Signed)
Medication Instructions:  Your physician recommends that you continue on your current medications as directed. Please refer to the Current Medication list given to you today.  *If you need a refill on your cardiac medications before your next appointment, please call your pharmacy*   Lab Work: NONE If you have labs (blood work) drawn today and your tests are completely normal, you will receive your results only by: MyChart Message (if you have MyChart) OR A paper copy in the mail If you have any lab test that is abnormal or we need to change your treatment, we will call you to review the results.   Testing/Procedures: NONE   Follow-Up: At [facility], you and your health needs are our priority.  As part of our continuing mission to provide you with exceptional heart care, we have created designated Provider Care Teams.  These Care Teams include your primary Cardiologist (physician) and Advanced Practice Providers (APPs -  Physician Assistants and Nurse Practitioners) who all work together to provide you with the care you need, when you need it.  We recommend signing up for the patient portal called "MyChart".  Sign up information is provided on this After Visit Summary.  MyChart is used to connect with patients for Virtual Visits (Telemedicine).  Patients are able to view lab/test results, encounter notes, upcoming appointments, etc.  Non-urgent messages can be sent to your provider as well.   To learn more about what you can do with MyChart, go to [email or web address].    Your next appointment:   6 month(s)  The format for your next appointment:   In Person  Provider:   Brian Munley, MD    Other Instructions   Important Information About Sugar       

## 2022-07-11 DIAGNOSIS — E039 Hypothyroidism, unspecified: Secondary | ICD-10-CM | POA: Diagnosis not present

## 2022-07-11 DIAGNOSIS — N951 Menopausal and female climacteric states: Secondary | ICD-10-CM | POA: Diagnosis not present

## 2022-07-15 DIAGNOSIS — G479 Sleep disorder, unspecified: Secondary | ICD-10-CM | POA: Diagnosis not present

## 2022-07-15 DIAGNOSIS — R6882 Decreased libido: Secondary | ICD-10-CM | POA: Diagnosis not present

## 2022-07-15 DIAGNOSIS — E039 Hypothyroidism, unspecified: Secondary | ICD-10-CM | POA: Diagnosis not present

## 2022-07-15 DIAGNOSIS — N951 Menopausal and female climacteric states: Secondary | ICD-10-CM | POA: Diagnosis not present

## 2022-07-15 DIAGNOSIS — N898 Other specified noninflammatory disorders of vagina: Secondary | ICD-10-CM | POA: Diagnosis not present

## 2022-07-15 DIAGNOSIS — I4891 Unspecified atrial fibrillation: Secondary | ICD-10-CM | POA: Diagnosis not present

## 2022-07-15 DIAGNOSIS — Z79890 Hormone replacement therapy (postmenopausal): Secondary | ICD-10-CM | POA: Diagnosis not present

## 2022-07-15 DIAGNOSIS — R454 Irritability and anger: Secondary | ICD-10-CM | POA: Diagnosis not present

## 2022-07-15 DIAGNOSIS — I10 Essential (primary) hypertension: Secondary | ICD-10-CM | POA: Diagnosis not present

## 2022-08-02 ENCOUNTER — Ambulatory Visit (INDEPENDENT_AMBULATORY_CARE_PROVIDER_SITE_OTHER): Payer: PPO

## 2022-08-02 VITALS — Wt 230.0 lb

## 2022-08-02 DIAGNOSIS — Z0000 Encounter for general adult medical examination without abnormal findings: Secondary | ICD-10-CM | POA: Diagnosis not present

## 2022-08-02 NOTE — Progress Notes (Addendum)
I connected with  Paulino Door on 08/02/22 by a audio enabled telemedicine application and verified that I am speaking with the correct person using two identifiers.  Patient Location: Home  Provider Location: Office/Clinic  I discussed the limitations of evaluation and management by telemedicine. The patient expressed understanding and agreed to proceed.   Subjective:   [name] is a 76 y.o. female who presents for an Initial Medicare Annual Wellness Visit.  Review of Systems     Cardiac Risk Factors include: advanced age (>40mn, >>12women);hypertension;obesity (BMI >30kg/m2)     Objective:    Today's Vitals   08/02/22 1427  Weight: 230 lb (104.3 kg)   Body mass index is 40.74 kg/m.     08/02/2022    2:32 PM  Advanced Directives  Does Patient Have a Medical Advance Directive? Yes  Type of AParamedicof AFosterLiving will  Copy of [place]? No - copy requested    Current Medications (verified) Outpatient Encounter Medications as of 08/02/2022  Medication Sig   albuterol (VENTOLIN HFA) 108 (90 Base) MCG/ACT inhaler ProAir HFA 90 mcg/actuation aerosol inhaler  INHALE 1 TO 2 PUFFS EVERY 4 TO 6 HOURS AS NEEDED 30 DAYS (Patient taking differently: Inhale 1-2 puffs into the lungs every 6 (six) hours as needed for wheezing or shortness of breath. ProAir HFA 90 mcg/actuation aerosol inhaler  INHALE 1 TO 2 PUFFS EVERY 4 TO 6 HOURS AS NEEDED 30 DAYS)   apixaban (ELIQUIS) 5 MG TABS tablet Take 1 tablet (5 mg total) by mouth 2 (two) times daily.   furosemide (LASIX) 20 MG tablet Take 1 tablet (20 mg total) by mouth 3 (three) times a week. As needed for fluid   losartan-hydrochlorothiazide (HYZAAR) 50-12.5 MG tablet Take 1 tablet by mouth daily.   ondansetron (ZOFRAN-ODT) 4 MG disintegrating tablet Take 1 tablet (4 mg total) by mouth every 8 (eight) hours as needed for nausea or vomiting (for nausea from wegovy or other  source).   pantoprazole (PROTONIX) 40 MG tablet Take 1 tablet (40 mg total) by mouth daily as needed (acid reflux).   potassium chloride (KLOR-CON) 10 MEQ tablet Take 1 tablet (10 mEq total) by mouth 3 (three) times a week.   verapamil (CALAN) 80 MG tablet Take 1 tablet (80 mg total) by mouth 2 (two) times daily.   Vitamin D, Ergocalciferol, (DRISDOL) 1.25 MG (50000 UNIT) CAPS capsule TAKE 1 CAPSULE BY MOUTH 2 TIMES A WEEK (Patient taking differently: Take 50,000 Units by mouth 2 (two) times a week. TAKE 1 CAPSULE BY MOUTH 2 TIMES A WEEK)   Semaglutide-Weight Management 0.5 MG/0.5ML SOAJ Inject 0.5 mg into the skin once a week for 28 days. (Patient not taking: Reported on 07/08/2022)   [START ON 08/07/2022] Semaglutide-Weight Management 1 MG/0.5ML SOAJ Inject 1 mg into the skin once a week for 28 days. (Patient not taking: Reported on 07/08/2022)   [START ON 09/05/2022] Semaglutide-Weight Management 1.7 MG/0.75ML SOAJ Inject 1.7 mg into the skin once a week for 28 days. (Patient not taking: Reported on 07/08/2022)   [START ON 10/04/2022] Semaglutide-Weight Management 2.4 MG/0.75ML SOAJ Inject 2.4 mg into the skin once a week for 28 days. (Patient not taking: Reported on 07/08/2022)   No facility-administered encounter medications on file as of 08/02/2022.    Allergies (verified) Codeine, Misc. 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   Morbid obesity ([place]) 06/10/2022   PAF (paroxysmal atrial fibrillation) ([place]) 12/10/2018   SVT (supraventricular tachycardia) 04/24/2019   Past Surgical History:   Procedure Laterality Date   ABDOMINAL HYSTERECTOMY  03/2010   TAH/BSO   CESAREAN SECTION  1981   ENDOMETRIAL BIOPSY     05-26-09--benign, 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   ROTATOR CUFF REPAIR Left 2010   TOTAL KNEE ARTHROPLASTY Bilateral    Family History  Problem Relation Age of Onset   Breast cancer Mother    Heart attack Maternal Grandfather    Breast cancer Paternal Aunt    Stroke Paternal 58    Breast cancer Paternal 14    Stroke Other    Depression Daughter    ADD / ADHD Daughter    Depression Daughter    ADD / ADHD Daughter    Depression Daughter    Social History   Socioeconomic History   Marital status: Divorced    Spouse name: Not on file   Number of children: Not on file   Years of education: Not on file   Highest education level: Not on file  Occupational History   Not on file  Tobacco Use   Smoking status: Never   Smokeless tobacco: Never  Vaping Use   Vaping Use: Never used  Substance and Sexual Activity   Alcohol use: Yes    Comment: occ wine   Drug use: Never   Sexual activity: Never    Partners: Male    Birth control/protection: Surgical    Comment: TAH/BSO  Other Topics Concern   Not on file  Social History Narrative   Not on file   Social Determinants of Health   Financial Resource Strain: Low Risk  (08/02/2022)   Overall Financial Resource Strain (CARDIA)    Difficulty of Paying Living Expenses: Not hard at all  Food Insecurity: No Food Insecurity (08/02/2022)   Hunger Vital Sign    Worried About Running Out of Food in the Last Year: Never true    Ran Out of Food in the Last Year: Never true  Transportation Needs: No Transportation Needs (08/02/2022)   PRAPARE - Hydrologist (Medical): No    Lack of Transportation (Non-Medical): No  Physical Activity: Inactive (08/02/2022)   Exercise Vital Sign    Days of Exercise per Week: 0 days    Minutes of Exercise per Session: 0 min  Stress: Stress  Concern Present (08/02/2022)   [place]    Feeling of Stress : To some extent  Social Connections: Moderately Integrated (08/02/2022)   Social Connection and Isolation Panel [NHANES]    Frequency of Communication with Friends and Family: More than three times a week    Frequency of Social Gatherings with Friends and Family: More than three times a week    Attends Religious Services: 1 to 4 times per year    Active Member of [employer] or Organizations: No    Attends Archivist Meetings: Never    Marital Status: Married    Tobacco Counseling Counseling given: Not Answered   Clinical Intake:  Pre-visit preparation completed: Yes  Pain : No/denies  pain     BMI - recorded: 40.74 Nutritional Status: BMI > 30  Obese Nutritional Risks: None Diabetes: No  How often do you need to have someone help you when you read instructions, pamphlets, or other written materials from your doctor or pharmacy?: 1 - Never  Diabetic?no  Interpreter Needed?: No  Information entered by :: Charlott Rakes, LPN   Activities of Daily Living    08/02/2022    2:36 PM  In your present state of health, do you have any difficulty performing the following activities:  Hearing? 0  Vision? 0  Difficulty concentrating or making decisions? 0  Walking or climbing stairs? 0  Dressing or bathing? 0  Doing errands, shopping? 0  Preparing Food and eating ? N  Using the Toilet? N  In the past six months, have you accidently leaked urine? N  Do you have problems with loss of bowel control? N  Managing your Medications? N  Managing your Finances? N  Housekeeping or managing your Housekeeping? N    Patient Care Team: Loralee Pacas, MD as PCP - General (Internal Medicine) Richardo Priest, MD as PCP - Cardiology (Cardiology)  Indicate any recent Medical Services you may have received from other than [facility] providers in the past year  (date may be approximate).     Assessment:   This is a routine wellness examination for [place].  Hearing/Vision screen Hearing Screening - Comments:: Pt denies hearing issues  Vision Screening - Comments:: Pt follows up with Dr Prudencio Burly for annul eye exams   Dietary issues and exercise activities discussed: Current Exercise Habits: The patient does not participate in regular exercise at present   Goals Addressed             This Visit's Progress    Patient Stated       Patient Stated       Lose weight        Depression Screen    08/02/2022    2:33 PM  PHQ 2/9 Scores  PHQ - 2 Score 0    Fall Risk    08/02/2022    2:33 PM  [place] in the past year? 0  Number falls in past yr: 0  Injury with Fall? 0  Risk for fall due to : Impaired vision  Follow up Falls prevention discussed    FALL RISK PREVENTION PERTAINING TO THE HOME:  Any stairs in or around the home? Yes  If so, are there any without handrails? No  Home free of loose throw rugs in walkways, pet beds, electrical cords, etc? Yes  Adequate lighting in your home to reduce risk of falls? Yes   ASSISTIVE DEVICES UTILIZED TO PREVENT FALLS:  Life alert? No  Use of a cane, walker or w/c? No  Grab bars in the bathroom? No  Shower chair or bench in shower? No  Elevated toilet seat or a handicapped toilet? No   TIMED UP AND GO:  Was the test performed? No .  Cognitive Function:        08/02/2022    2:37 PM  6CIT Screen  What Year? 0 points  What month? 0 points  What time? 0 points  Count back from 20 0 points  Months in reverse 0 points  Repeat phrase 0 points  Total Score 0 points    Immunizations Immunization History  Administered Date(s) Administered   Influenza-Unspecified 05/06/2014   Unspecified SARS-COV-2 Vaccination 09/19/2019, 10/10/2019   Zoster  Recombinat (Shingrix) 01/23/2019, 04/26/2019    TDAP status: Due, Education has been provided regarding the importance of  this vaccine. Advised may receive this vaccine at local pharmacy or Health Dept. Aware to provide a copy of the vaccination record if obtained from local pharmacy or Health Dept. Verbalized acceptance and understanding.  Flu Vaccine status: Up to date  Pneumococcal vaccine status: Due, Education has been provided regarding the importance of this vaccine. Advised may receive this vaccine at local pharmacy or Health Dept. Aware to provide a copy of the vaccination record if obtained from local pharmacy or Health Dept. Verbalized acceptance and understanding.  Covid-19 vaccine status: Completed vaccines  Qualifies for Shingles Vaccine? Yes   Zostavax completed Yes   Shingrix Completed?: Yes  Screening Tests Health Maintenance  Topic Date Due   Hepatitis C Screening  Never done   Pneumonia Vaccine 53+ Years old (1 - PCV) Never done   INFLUENZA VACCINE  04/05/2022   COVID-19 Vaccine (3 - 2023-24 season) 05/06/2022   DEXA SCAN  08/03/2023 (Originally 12/01/2012)   Medicare Annual Wellness (AWV)  08/03/2023   MAMMOGRAM  10/16/2023   COLONOSCOPY (Pts 45-76yr Insurance coverage will need to be confirmed)  01/03/2031   Zoster Vaccines- Shingrix  Completed   HPV VACCINES  Aged Out    Health Maintenance  Health Maintenance Due  Topic Date Due   Hepatitis C Screening  Never done   Pneumonia Vaccine 74 Years old (1 - PCV) Never done   INFLUENZA VACCINE  04/05/2022   COVID-19 Vaccine (3 - 2023-24 season) 05/06/2022    Colorectal cancer screening: Type of screening: Colonoscopy. Completed 01/02/21. Repeat every 10 years  Mammogram status: Completed 10/15/21. Repeat every year     Additional Screening:  Hepatitis C Screening: does qualify;  Vision Screening: Recommended annual ophthalmology exams for early detection of glaucoma and other disorders of the eye. Is the patient up to date with their annual eye exam?  Yes  Who is the provider or what is the name of the office in which the  patient attends annual eye exams? Dr LPrudencio Burly If pt is not established with a provider, would they like to be referred to a provider to establish care? No .   Dental Screening: Recommended annual dental exams for proper oral hygiene  Community Resource Referral / Chronic Care Management: CRR required this visit?  No   CCM required this visit?  No      Plan:     I have personally reviewed and noted the following in the patient's chart:   Medical and social history Use of alcohol, tobacco or illicit drugs  Current medications and supplements including opioid prescriptions. Patient is not currently taking opioid prescriptions. Functional ability and status Nutritional status Physical activity Advanced directives List of other physicians Hospitalizations, surgeries, and ER visits in previous 12 months Vitals Screenings to include cognitive, depression, and falls Referrals and appointments  In addition, I have reviewed and discussed with patient certain preventive protocols, quality metrics, and best practice recommendations. A written personalized care plan for preventive services as well as general preventive health recommendations were provided to patient.     TWillette Brace LPN   [id number]  Nurse Notes: none

## 2022-08-02 NOTE — Patient Instructions (Signed)
[name] , Thank you for taking time to come for your Medicare Wellness Visit. I appreciate your ongoing commitment to your health goals. Please review the following plan we discussed and let me know if I can assist you in the future.   These are the goals we discussed:  Goals      Patient Stated     Patient Stated     Lose weight         This is a list of the screening recommended for you and due dates:  Health Maintenance  Topic Date Due   Hepatitis C Screening: USPSTF Recommendation to screen - Ages 62-79 yo.  Never done   Pneumonia Vaccine (1 - PCV) Never done   Flu Shot  04/05/2022   COVID-19 Vaccine (3 - 2023-24 season) 05/06/2022   DEXA scan (bone density measurement)  08/03/2023*   Medicare Annual Wellness Visit  08/03/2023   Mammogram  10/16/2023   Colon Cancer Screening  01/03/2031   Zoster (Shingles) Vaccine  Completed   HPV Vaccine  Aged Out  *Topic was postponed. The date shown is not the original due date.    Advanced directives: Please bring a copy of your health care power of attorney and living will to the office at your convenience.  Conditions/risks identified: lose weight   Next appointment: Follow up in one year for your annual wellness visit    Preventive Care 65 Years and Older, Female Preventive care refers to lifestyle choices and visits with your health care provider that can promote health and wellness. What does preventive care include? A yearly physical exam. This is also called an annual well check. Dental exams once or twice a year. Routine eye exams. Ask your health care provider how often you should have your eyes checked. Personal lifestyle choices, including: Daily care of your teeth and gums. Regular physical activity. Eating a healthy diet. Avoiding tobacco and drug use. Limiting alcohol use. Practicing safe sex. Taking low-dose aspirin every day. Taking vitamin and mineral supplements as recommended by your health care  provider. What happens during an annual well check? The services and screenings done by your health care provider during your annual well check will depend on your age, overall health, lifestyle risk factors, and family history of disease. Counseling  Your health care provider may ask you questions about your: Alcohol use. Tobacco use. Drug use. Emotional well-being. Home and relationship well-being. Sexual activity. Eating habits. History of falls. Memory and ability to understand (cognition). Work and work Statistician. Reproductive health. Screening  You may have the following tests or measurements: Height, weight, and BMI. Blood pressure. Lipid and cholesterol levels. These may be checked every 5 years, or more frequently if you are over 39 years old. Skin check. Lung cancer screening. You may have this screening every year starting at age 40 if you have a 30-pack-year history of smoking and currently smoke or have quit within the past 15 years. Fecal occult blood test (FOBT) of the stool. You may have this test every year starting at age 43. Flexible sigmoidoscopy or colonoscopy. You may have a sigmoidoscopy every 5 years or a colonoscopy every 10 years starting at age 70. Hepatitis C blood test. Hepatitis B blood test. Sexually transmitted disease (STD) testing. Diabetes screening. This is done by checking your blood sugar (glucose) after you have not eaten for a while (fasting). You may have this done every 1-3 years. Bone density scan. This is done to screen for osteoporosis.  You may have this done starting at age 5. Mammogram. This may be done every 1-2 years. Talk to your health care provider about how often you should have regular mammograms. Talk with your health care provider about your test results, treatment options, and if necessary, the need for more tests. Vaccines  Your health care provider may recommend certain vaccines, such as: Influenza vaccine. This is  recommended every year. Tetanus, diphtheria, and acellular pertussis (Tdap, Td) vaccine. You may need a Td booster every 10 years. Zoster vaccine. You may need this after age 59. Pneumococcal 13-valent conjugate (PCV13) vaccine. One dose is recommended after age 58. Pneumococcal polysaccharide (PPSV23) vaccine. One dose is recommended after age 71. Talk to your health care provider about which screenings and vaccines you need and how often you need them. This information is not intended to replace advice given to you by your health care provider. Make sure you discuss any questions you have with your health care provider. Document Released: 09/18/2015 Document Revised: 05/11/2016 Document Reviewed: 06/23/2015 Elsevier Interactive Patient Education  2017 [place] Prevention in the Home Falls can cause injuries. They can happen to people of all ages. There are many things you can do to make your home safe and to help prevent falls. What can I do on the outside of my home? Regularly fix the edges of walkways and driveways and fix any cracks. Remove anything that might make you trip as you walk through a door, such as a raised step or threshold. Trim any bushes or trees on the path to your home. Use bright outdoor lighting. Clear any walking paths of anything that might make someone trip, such as rocks or tools. Regularly check to see if handrails are loose or broken. Make sure that both sides of any steps have handrails. Any raised decks and porches should have guardrails on the edges. Have any leaves, snow, or ice cleared regularly. Use sand or salt on walking paths during winter. Clean up any spills in your garage right away. This includes oil or grease spills. What can I do in the bathroom? Use night lights. Install grab bars by the toilet and in the tub and shower. Do not use towel bars as grab bars. Use non-skid mats or decals in the tub or shower. If you need to sit down in  the shower, use a plastic, non-slip stool. Keep the floor dry. Clean up any water that spills on the floor as soon as it happens. Remove soap buildup in the tub or shower regularly. Attach bath mats securely with double-sided non-slip rug tape. Do not have throw rugs and other things on the floor that can make you trip. What can I do in the bedroom? Use night lights. Make sure that you have a light by your bed that is easy to reach. Do not use any sheets or blankets that are too big for your bed. They should not hang down onto the floor. Have a firm chair that has side arms. You can use this for support while you get dressed. Do not have throw rugs and other things on the floor that can make you trip. What can I do in the kitchen? Clean up any spills right away. Avoid walking on wet floors. Keep items that you use a lot in easy-to-reach places. If you need to reach something above you, use a strong step stool that has a grab bar. Keep electrical cords out of the way. Do not use  floor polish or wax that makes floors slippery. If you must use wax, use non-skid floor wax. Do not have throw rugs and other things on the floor that can make you trip. What can I do with my stairs? Do not leave any items on the stairs. Make sure that there are handrails on both sides of the stairs and use them. Fix handrails that are broken or loose. Make sure that handrails are as long as the stairways. Check any carpeting to make sure that it is firmly attached to the stairs. Fix any carpet that is loose or worn. Avoid having throw rugs at the top or bottom of the stairs. If you do have throw rugs, attach them to the floor with carpet tape. Make sure that you have a light switch at the top of the stairs and the bottom of the stairs. If you do not have them, ask someone to add them for you. What else can I do to help prevent falls? Wear shoes that: Do not have high heels. Have rubber bottoms. Are comfortable  and fit you well. Are closed at the toe. Do not wear sandals. If you use a stepladder: Make sure that it is fully opened. Do not climb a closed stepladder. Make sure that both sides of the stepladder are locked into place. Ask someone to hold it for you, if possible. Clearly mark and make sure that you can see: Any grab bars or handrails. First and last steps. Where the edge of each step is. Use tools that help you move around (mobility aids) if they are needed. These include: Canes. Walkers. Scooters. Crutches. Turn on the lights when you go into a dark area. Replace any light bulbs as soon as they burn out. Set up your furniture so you have a clear path. Avoid moving your furniture around. If any of your floors are uneven, fix them. If there are any pets around you, be aware of where they are. Review your medicines with your doctor. Some medicines can make you feel dizzy. This can increase your chance of falling. Ask your doctor what other things that you can do to help prevent falls. This information is not intended to replace advice given to you by your health care provider. Make sure you discuss any questions you have with your health care provider. Document Released: 06/18/2009 Document Revised: 01/28/2016 Document Reviewed: 09/26/2014 Elsevier Interactive Patient Education  2017 [employer].

## 2022-08-11 ENCOUNTER — Telehealth: Payer: Self-pay | Admitting: Pharmacist

## 2022-08-11 ENCOUNTER — Other Ambulatory Visit: Payer: Self-pay

## 2022-08-11 NOTE — Telephone Encounter (Signed)
Pt has Medicare insurance and no DM diagnosis, her insurance will not cover semaglutide or other GLPs unfortunately.

## 2022-08-11 NOTE — Telephone Encounter (Signed)
-----   Message from Louie Casa, RN sent at 08/11/2022  9:01 AM EST ----- Please consider this patient for the semiglutide clinic per Dr. Bettina Gavia.  Thanks

## 2022-08-12 NOTE — Telephone Encounter (Signed)
Patient informed that she does not qualify for the semiglutide clinic:  "Pt has Medicare insurance and no DM diagnosis, her insurance will not cover semaglutide or other GLPs unfortunately."   Patient thanked [place] for the call and had no further questions at this time.

## 2022-08-18 ENCOUNTER — Encounter: Payer: Self-pay | Admitting: *Deleted

## 2022-08-19 ENCOUNTER — Ambulatory Visit (INDEPENDENT_AMBULATORY_CARE_PROVIDER_SITE_OTHER): Payer: PPO | Admitting: Physician Assistant

## 2022-08-19 ENCOUNTER — Encounter: Payer: Self-pay | Admitting: Physician Assistant

## 2022-08-19 VITALS — BP 112/70 | HR 67 | Temp 97.8°F | Ht 63.0 in | Wt 234.4 lb

## 2022-08-19 DIAGNOSIS — U071 COVID-19: Secondary | ICD-10-CM | POA: Diagnosis not present

## 2022-08-19 LAB — POC COVID19 BINAXNOW: SARS Coronavirus 2 Ag: POSITIVE — AB

## 2022-08-19 MED ORDER — ALBUTEROL SULFATE (2.5 MG/3ML) 0.083% IN NEBU: 2.5000 mg | INHALATION_SOLUTION | Freq: Four times a day (QID) | RESPIRATORY_TRACT | 12 refills | Status: DC | PRN

## 2022-08-19 MED ORDER — AZITHROMYCIN 250 MG PO TABS: ORAL_TABLET | ORAL | 0 refills | Status: AC

## 2022-08-19 NOTE — Patient Instructions (Signed)
It was great to see you!  Continue nebulizer as needed - refill sent Azithromycin sent in, should you need it  For current/suspected COVID symptoms: - Please watch closely for new onset shortness of breath, worsening shortness of breath, dizziness, confusion or any worsening symptoms. If any of these occur, please contact us during business hours, and if after business hours, please seek urgent care or go to the closest emergency room.  -Consider purchasing a pulse oximeter. If your levels are 94% or below persistently, please seek care at the hospital.   -If you test positive for COVID, everyone, regardless of vaccination status, should stay home for 5 days since symptom onset (or if asymptomatic, on day of positive test.) If you have no symptoms or your symptoms are resolving after 5 days, you can leave your house. Continue to wear a mask around others for 5 additional days. If you have a fever, continue to stay home until your fever resolves without use of medication.  -Please inform any contacts of your positive result so they can appropriately quarantine/test.  -Push fluids and try to eat small, frequent meals with protein to maintain your stamina.

## 2022-08-19 NOTE — Progress Notes (Signed)
[name] is a 76 y.o. female here for a new problem.  History of Present Illness:   Chief Complaint  Patient presents with   Cough    Pt c/o cough started last Sunday night, husband positive for COVID    Cough    COVID-19 Positive Symptom onset: Sunday night Travel/contacts: work  Vaccination status: had the first two vaccines; no booster Testing results: neg on Monday, positive in office today  Patient endorses the following symptoms:  Started with runny nose and cough, subjective fever,   Patient denies the following symptoms: body aches, chest pain, SOB  Treatments tried: nebulizer, tylenol   Hx reactive airway disease  Has had COVID in the past - had infusion, did well.  Past Medical History:  Diagnosis Date   Atrial fibrillation ([place])    Cervical radiculopathy 02/23/2018   Chaotic atrial rhythm 10/19/2018   Chronic anticoagulation 01/24/2019   DDD (degenerative disc disease), cervical 03/23/2018   Fecal occult blood test positive 12/06/2018   Heart murmur    --innocent   Hypertension    Hypertensive heart disease without heart failure 12/10/2018   Morbid obesity ([place]) 06/10/2022   PAF (paroxysmal atrial fibrillation) ([place]) 12/10/2018   SVT (supraventricular tachycardia) 04/24/2019     Social History   Tobacco Use   Smoking status: Never   Smokeless tobacco: Never  Vaping Use   Vaping Use: Never used  Substance Use Topics   Alcohol use: Yes    Comment: occ wine   Drug use: Never    Past Surgical History:  Procedure Laterality Date   ABDOMINAL HYSTERECTOMY  03/2010   TAH/BSO   CESAREAN SECTION  1981   ENDOMETRIAL BIOPSY     05-26-09--benign, 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 Sulfonamide Containing Compounds Other (See Comments) and Rash   Sulfa Antibiotics Rash    Current Medications:   Current Outpatient Medications:    albuterol (VENTOLIN HFA) 108 (90 Base) MCG/ACT inhaler, ProAir HFA 90 mcg/actuation aerosol inhaler  INHALE 1 TO 2 PUFFS EVERY 4 TO 6 HOURS AS NEEDED 30 DAYS (Patient taking differently: Inhale 1-2 puffs into the lungs every 6 (six) hours as needed for wheezing or shortness of breath. ProAir HFA 90 mcg/actuation aerosol inhaler  INHALE 1 TO 2 PUFFS EVERY 4 TO 6 HOURS AS NEEDED 30 DAYS), Disp: 2 each, Rfl: 11   apixaban (ELIQUIS) 5 MG TABS tablet, Take 1 tablet (5 mg total) by mouth 2 (two) times daily., Disp: 180 tablet, Rfl: 3   furosemide (LASIX) 20 MG tablet, Take 1 tablet (20 mg total) by mouth 3 (three) times a week. As needed for fluid, Disp: 45 tablet, Rfl: 3   losartan-hydrochlorothiazide (HYZAAR) 50-12.5 MG tablet, Take 1 tablet by mouth daily., Disp: 90 tablet, Rfl: 3   ondansetron (ZOFRAN-ODT) 4 MG disintegrating tablet, Take 1 tablet (4 mg total) by mouth every 8 (  eight) hours as needed for nausea or vomiting (for nausea from wegovy or other source)., Disp: 20 tablet, Rfl: 0   pantoprazole (PROTONIX) 40 MG tablet, Take 1 tablet (40 mg total) by mouth daily as needed (acid reflux)., Disp: 90 tablet, Rfl: 3   potassium chloride (KLOR-CON) 10 MEQ tablet, Take 1 tablet (10 mEq total) by mouth 3 (three) times a week., Disp: 45 tablet, Rfl: 3   verapamil (CALAN) 80 MG tablet, Take 1 tablet (80 mg total) by mouth 2 (two) times daily., Disp: 180 tablet, Rfl: 2   Vitamin D, Ergocalciferol, (DRISDOL) 1.25 MG (50000 UNIT) CAPS capsule, TAKE 1 CAPSULE BY MOUTH 2 TIMES A WEEK (Patient taking differently: Take 50,000 Units by mouth 2 (two) times a  week. TAKE 1 CAPSULE BY MOUTH 2 TIMES A WEEK), Disp: 5 capsule, Rfl: 11   Semaglutide-Weight Management 1 MG/0.5ML SOAJ, Inject 1 mg into the skin once a week for 28 days. (Patient not taking: Reported on 07/08/2022), Disp: 2 mL, Rfl: 0   [START ON 09/05/2022] Semaglutide-Weight Management 1.7 MG/0.75ML SOAJ, Inject 1.7 mg into the skin once a week for 28 days. (Patient not taking: Reported on 07/08/2022), Disp: 3 mL, Rfl: 0   [START ON 10/04/2022] Semaglutide-Weight Management 2.4 MG/0.75ML SOAJ, Inject 2.4 mg into the skin once a week for 28 days. (Patient not taking: Reported on 07/08/2022), Disp: 3 mL, Rfl: 0   Review of Systems:   Review of Systems  Respiratory:  Positive for cough.    Negative unless otherwise specified per HPI.  Vitals:   Vitals:   08/19/22 1601  BP: 112/70  Pulse: 67  Temp: 97.8 F (36.6 C)  TempSrc: Temporal  SpO2: 95%  Weight: 234 lb 6.1 oz (106.3 kg)  Height: '5\' 3"'$  (1.6 m)     Body mass index is 41.52 kg/m.  Physical Exam:   Physical Exam Vitals and nursing note reviewed.  Constitutional:      General: She is not in acute distress.    Appearance: She is well-developed. She is not ill-appearing or toxic-appearing.  HENT:     Head: Normocephalic and atraumatic.     Right Ear: Tympanic membrane, ear canal and external ear normal. Tympanic membrane is not erythematous, retracted or bulging.     Left Ear: Tympanic membrane, ear canal and external ear normal. Tympanic membrane is not erythematous, retracted or bulging.     Nose: Nose normal.     Right Sinus: No maxillary sinus tenderness or frontal sinus tenderness.     Left Sinus: No maxillary sinus tenderness or frontal sinus tenderness.     Mouth/Throat:     Pharynx: Uvula midline. No posterior oropharyngeal erythema.  Eyes:     General: Lids are normal.     Conjunctiva/sclera: Conjunctivae normal.  Neck:     Trachea: Trachea normal.  Cardiovascular:     Rate and Rhythm: Normal rate and regular  rhythm.     Heart sounds: Normal heart sounds, S1 normal and S2 normal.  Pulmonary:     Effort: Pulmonary effort is normal.     Breath sounds: Normal breath sounds. No decreased breath sounds, wheezing, rhonchi or rales.  Lymphadenopathy:     Cervical: No cervical adenopathy.  Skin:    General: Skin is warm and dry.  Neurological:     Mental Status: She is alert.  Psychiatric:        Speech: Speech normal.        Behavior: Behavior normal. Behavior is cooperative.  Results for orders placed or performed in visit on 08/19/22  POC COVID-19  Result Value Ref Range   SARS Coronavirus 2 Ag Positive (A) Negative    Assessment and Plan:   COVID-19 COVID test positive Patient has a respiratory illness without signs of acute distress or respiratory compromise at this time.  She is on day 6 of illness and is outside of treatment window for anti-virals She is not wheezing on exam She is agreeable to refill of nebulizer -- does not have hx of asthma/COPD but does have reactive airway - declines steroids at this time  I did however provide pocket rx for oral azithromcyin should symptoms not improve as anticipated. Discussed over the counter supportive care options, with recommendations to push fluids and rest. Reviewed return precautions including new/worsening fever, SOB, new/worsening cough or other concerns.  Recommended need to self-quarantine and practice social distancing until symptoms resolve. Discussed current recommendations for COVID testing. I recommend that patient follow-up if symptoms worsen or persist despite treatment x 7-10 days, sooner if needed.  Inda Coke, PA-C

## 2022-12-09 ENCOUNTER — Telehealth: Payer: Self-pay | Admitting: Internal Medicine

## 2022-12-09 ENCOUNTER — Ambulatory Visit (INDEPENDENT_AMBULATORY_CARE_PROVIDER_SITE_OTHER): Payer: PPO | Admitting: Internal Medicine

## 2022-12-09 ENCOUNTER — Encounter: Payer: Self-pay | Admitting: Internal Medicine

## 2022-12-09 DIAGNOSIS — M5412 Radiculopathy, cervical region: Secondary | ICD-10-CM

## 2022-12-09 DIAGNOSIS — R062 Wheezing: Secondary | ICD-10-CM | POA: Diagnosis not present

## 2022-12-09 DIAGNOSIS — E559 Vitamin D deficiency, unspecified: Secondary | ICD-10-CM | POA: Diagnosis not present

## 2022-12-09 DIAGNOSIS — I10 Essential (primary) hypertension: Secondary | ICD-10-CM | POA: Diagnosis not present

## 2022-12-09 DIAGNOSIS — K649 Unspecified hemorrhoids: Secondary | ICD-10-CM | POA: Insufficient documentation

## 2022-12-09 DIAGNOSIS — F419 Anxiety disorder, unspecified: Secondary | ICD-10-CM | POA: Diagnosis not present

## 2022-12-09 DIAGNOSIS — R252 Cramp and spasm: Secondary | ICD-10-CM | POA: Diagnosis not present

## 2022-12-09 DIAGNOSIS — Z860100 Personal history of colon polyps, unspecified: Secondary | ICD-10-CM | POA: Insufficient documentation

## 2022-12-09 DIAGNOSIS — I480 Paroxysmal atrial fibrillation: Secondary | ICD-10-CM

## 2022-12-09 DIAGNOSIS — M25511 Pain in right shoulder: Secondary | ICD-10-CM

## 2022-12-09 HISTORY — DX: Cramp and spasm: R25.2

## 2022-12-09 HISTORY — DX: Wheezing: R06.2

## 2022-12-09 LAB — CBC
HCT: 41.6 % (ref 36.0–46.0)
Hemoglobin: 13.9 g/dL (ref 12.0–15.0)
MCHC: 33.5 g/dL (ref 30.0–36.0)
MCV: 91.5 fl (ref 78.0–100.0)
Platelets: 243 K/uL (ref 150.0–400.0)
RBC: 4.54 Mil/uL (ref 3.87–5.11)
RDW: 13.5 % (ref 11.5–15.5)
WBC: 6.2 K/uL (ref 4.0–10.5)

## 2022-12-09 LAB — LIPID PANEL
Cholesterol: 174 mg/dL (ref 0–200)
HDL: 50.9 mg/dL
LDL Cholesterol: 104 mg/dL — ABNORMAL HIGH (ref 0–99)
NonHDL: 123.34
Total CHOL/HDL Ratio: 3
Triglycerides: 99 mg/dL (ref 0.0–149.0)
VLDL: 19.8 mg/dL (ref 0.0–40.0)

## 2022-12-09 LAB — COMPREHENSIVE METABOLIC PANEL
ALT: 18 U/L (ref 0–35)
AST: 24 U/L (ref 0–37)
Albumin: 3.9 g/dL (ref 3.5–5.2)
Alkaline Phosphatase: 98 U/L (ref 39–117)
BUN: 13 mg/dL (ref 6–23)
CO2: 30 mEq/L (ref 19–32)
Calcium: 9.2 mg/dL (ref 8.4–10.5)
Chloride: 103 mEq/L (ref 96–112)
Creatinine, Ser: 0.7 mg/dL (ref 0.40–1.20)
GFR: 84.84 mL/min (ref >60.00)
Glucose, Bld: 103 mg/dL — ABNORMAL HIGH (ref 70–99)
Potassium: 4.1 mEq/L (ref 3.5–5.1)
Sodium: 140 mEq/L (ref 135–145)
Total Bilirubin: 0.7 mg/dL (ref 0.2–1.2)
Total Protein: 6.6 g/dL (ref 6.0–8.3)

## 2022-12-09 LAB — VITAMIN D 25 HYDROXY (VIT D DEFICIENCY, FRACTURES): VITD: 40.59 ng/mL (ref 30.00–100.00)

## 2022-12-09 LAB — MAGNESIUM: Magnesium: 1.8 mg/dL (ref 1.5–2.5)

## 2022-12-09 MED ORDER — TOPIRAMATE 25 MG PO TABS
25.0000 mg | ORAL_TABLET | Freq: Two times a day (BID) | ORAL | 3 refills | Status: DC
Start: 2022-12-09 — End: 2023-05-18

## 2022-12-09 MED ORDER — METFORMIN HCL 500 MG PO TABS
500.0000 mg | ORAL_TABLET | Freq: Two times a day (BID) | ORAL | 3 refills | Status: DC
Start: 2022-12-09 — End: 2023-02-17

## 2022-12-09 NOTE — Telephone Encounter (Signed)
Pt would like a call back regarding the new RX Metformin, pt would like to know why she has to take med if she does not have blood sugar issues. Please advise.

## 2022-12-09 NOTE — Assessment & Plan Note (Signed)
Try generics metformin/topiramate Avoid Wellbutrin/phentermine due to heart

## 2022-12-09 NOTE — Assessment & Plan Note (Signed)
Encouraged to try magnesium with fluid pill.

## 2022-12-09 NOTE — Progress Notes (Signed)
[name] [phone]   Routine Medical Office Visit  Patient:  [name]      Age: 77 y.o.       Sex:  female  Date:   12/09/2022  PCP:    Lula OlszewskiMorrison, Lester Platas G, MD   Today's Healthcare Provider: Lula Olszewskiyan G Blayne Garlick, MD   Assessment and Plan:   Morbid obesity Assessment & Plan: Try generics metformin/topiramate Avoid Wellbutrin/phentermine due to heart  Orders: -     Topiramate; Take 1 tablet (25 mg total) by mouth 2 (two) times daily. To start: just 1 tablet daily for a week  Dispense: 180 tablet; Refill: 3 -     metFORMIN HCl; Take 1 tablet (500 mg total) by mouth 2 (two) times daily with a meal. To start: just half a tablet daily for 2 weeks.  Dispense: 180 tablet; Refill: 3  Anxiety  Paroxysmal atrial fibrillation  Cervical radiculopathy  Leg cramps Assessment & Plan: Encouraged to try magnesium with fluid pill.  Orders: -     Magnesium  Pain of right shoulder joint on movement  Hemorrhoids, unspecified hemorrhoid type  History of colon polyps  Wheezing  Hypertension, unspecified type -     CBC -     Comprehensive metabolic panel -     Lipid panel  Vitamin D deficiency -     VITAMIN D 25 Hydroxy (Vit-D Deficiency, Fractures)       Clinical Presentation:   77 y.o. female here today for Follow-up and Obesity  Reviewed:  has a past medical history of Atrial fibrillation, Cervical radiculopathy (02/23/2018), Chaotic atrial rhythm (10/19/2018), Chronic anticoagulation (01/24/2019), DDD (degenerative disc disease), cervical (03/23/2018), Fecal occult blood test positive (12/06/2018), Heart murmur, Hypertension, Hypertensive heart disease without heart failure (12/10/2018), Leg cramps (12/09/2022), Morbid obesity (06/10/2022), PAF (paroxysmal atrial fibrillation) (12/10/2018), and SVT (supraventricular tachycardia) (04/24/2019). Active Ambulatory Problems    Diagnosis Date Noted   Fecal occult blood test positive 12/06/2018   PAF  (paroxysmal atrial fibrillation) 12/10/2018   Chronic anticoagulation 01/24/2019   Cervical radiculopathy 02/23/2018   DDD (degenerative disc disease), cervical 03/23/2018   Hypertension 01/31/2020   Heart murmur    Atrial fibrillation 01/31/2020   Anxiety 06/10/2022   Pain of right shoulder joint on movement 01/19/2021   Morbid obesity 06/10/2022   Leg cramps 12/09/2022   Wheezing 12/09/2022   Vitamin D deficiency 12/09/2022   Hemorrhoids 12/09/2022   History of colon polyps 12/09/2022   Resolved Ambulatory Problems    Diagnosis Date Noted   Chaotic atrial rhythm 10/19/2018   Hypertensive heart disease without heart failure 12/10/2018   SVT (supraventricular tachycardia) 04/24/2019   No Additional Past Medical History    Outpatient Medications Prior to Visit  Medication Sig   albuterol (PROVENTIL) (2.5 MG/3ML) 0.083% nebulizer solution Take 3 mLs (2.5 mg total) by nebulization every 6 (six) hours as needed for wheezing or shortness of breath.   albuterol (VENTOLIN HFA) 108 (90 Base) MCG/ACT inhaler ProAir HFA 90 mcg/actuation aerosol inhaler  INHALE 1 TO 2 PUFFS EVERY 4 TO 6 HOURS AS NEEDED 30 DAYS (Patient taking differently: Inhale 1-2 puffs into the lungs every 6 (six) hours as needed for wheezing or shortness of breath. ProAir HFA 90 mcg/actuation aerosol inhaler  INHALE 1 TO 2 PUFFS EVERY 4 TO 6 HOURS AS NEEDED 30 DAYS)   apixaban (ELIQUIS) 5 MG TABS tablet Take 1 tablet (5 mg total) by mouth 2 (two) times daily.   furosemide (LASIX) 20 MG tablet  Take 1 tablet (20 mg total) by mouth 3 (three) times a week. As needed for fluid   losartan-hydrochlorothiazide (HYZAAR) 50-12.5 MG tablet Take 1 tablet by mouth daily.   mupirocin ointment (BACTROBAN) 2 % Apply topically.   ondansetron (ZOFRAN-ODT) 4 MG disintegrating tablet Take 1 tablet (4 mg total) by mouth every 8 (eight) hours as needed for nausea or vomiting (for nausea from wegovy or other source).   pantoprazole (PROTONIX)  40 MG tablet Take 1 tablet (40 mg total) by mouth daily as needed (acid reflux).   potassium chloride (KLOR-CON) 10 MEQ tablet Take 1 tablet (10 mEq total) by mouth 3 (three) times a week.   verapamil (CALAN) 80 MG tablet Take 1 tablet (80 mg total) by mouth 2 (two) times daily.   Vitamin D, Ergocalciferol, (DRISDOL) 1.25 MG (50000 UNIT) CAPS capsule TAKE 1 CAPSULE BY MOUTH 2 TIMES A WEEK (Patient taking differently: Take 50,000 Units by mouth 2 (two) times a week. TAKE 1 CAPSULE BY MOUTH 2 TIMES A WEEK)   No facility-administered medications prior to visit.    HPI  Updated and modified:  Problem  Leg Cramps   Notes when she takes fluid and potassium.Marland Kitchen. doesn't take magnesium   Wheezing   Albuterol without diagnosis of asthma, with upper respiratory infection (URI)s   Vitamin D Deficiency   Taking twice weekly it used to be very very low   Hemorrhoids  History of Colon Polyps  Anxiety   Good days and bad days "Don't take medication(s) and don't want to to be honest"     Morbid Obesity   [facility]   Pain of Right Shoulder Joint On Movement   On occasional nowadays, had surgeries on rotator cuffs 2022ish on right 2012ish on left.   Hypertension    Current hypertension medications:       Sig   furosemide (LASIX) 20 MG tablet (Taking) Take 1 tablet (20 mg total) by mouth 3 (three) times a week. As needed for fluid   losartan-hydrochlorothiazide (HYZAAR) 50-12.5 MG tablet (Taking) Take 1 tablet by mouth daily.   verapamil (CALAN) 80 MG tablet (Taking) Take 1 tablet (80 mg total) by mouth 2 (two) times daily.       Patient reports compliance with current medications and no significant side effect(s)  Home readings: she doesn't check BP Readings from Last 3 Encounters:  12/09/22 106/64  08/19/22 112/70  07/08/22 136/72       Atrial Fibrillation   12/09/22. Patient reports compliance with eliquis, patient reports that they are not experiencing any  symptom(s) but does have chronic shortness of breath "because I'm fat"   paroxysmal, evaluated by Dr. Dulce SellarMunley 07/2022 Plan is continue anticoag, CCB, BP control medications   Cervical Radiculopathy   Interim history: Not bothering her anymore "probably from my shoulder repair, but I don't have that no more"   Svt (Supraventricular Tachycardia) (Resolved)             Clinical Data Analysis:   Physical Exam  BP 106/64 (BP Location: Right Arm, Patient Position: Sitting)   Pulse 66   Temp 98.7 F (37.1 C) (Temporal)   Ht 5\' 3"  (1.6 m)   Wt 238 lb 9.6 oz (108.2 kg)   SpO2 94%   BMI 42.27 kg/m  Wt Readings from Last 10 Encounters:  12/09/22 238 lb 9.6 oz (108.2 kg)  08/19/22 234 lb 6.1 oz (106.3 kg)  08/02/22 230 lb (104.3 kg)  07/08/22 234 lb (  106.1 kg)  06/10/22 231 lb 3.2 oz (104.9 kg)  09/10/21 232 lb 12.8 oz (105.6 kg)  05/07/21 232 lb 1.3 oz (105.3 kg)  10/09/20 231 lb (104.8 kg)  04/17/20 231 lb (104.8 kg)  09/16/19 241 lb (109.3 kg)   Vital signs reviewed.  Nursing notes reviewed. Weight trend reviewed. Abnormalities and Problem-Specific physical exam findings:   General Appearance:  No acute distress appreciable.   Well-groomed, healthy-appearing female.  Well proportioned with no abnormal fat distribution.  Good muscle tone. Skin: Clear and well-hydrated. Pulmonary:  Normal work of breathing at rest, no respiratory distress apparent. SpO2: 94 %  Musculoskeletal: Patient demonstrates smooth and coordinated movements throughout all major joints.All extremities are intact.  Neurological:  Awake, alert, oriented, and engaged.  No obvious focal neurological deficits or cognitive impairments.  Sensorium seems unclouded. Gait is smooth and coordinated.  Speech is clear and coherent with logical content. Psychiatric:  Appropriate mood, pleasant and cooperative demeanor, cheerful and engaged during the exam   --------------------------------    Signed: Lula Olszewski,  MD 12/09/2022 8:50 AM

## 2022-12-10 ENCOUNTER — Encounter: Payer: Self-pay | Admitting: Internal Medicine

## 2022-12-13 ENCOUNTER — Telehealth: Payer: Self-pay | Admitting: Internal Medicine

## 2022-12-13 NOTE — Telephone Encounter (Signed)
Pt would like a call back for lab results.

## 2022-12-14 NOTE — Telephone Encounter (Signed)
Spoke with patient concerning this yesterday.

## 2022-12-14 NOTE — Telephone Encounter (Signed)
Spoke with patient and informed her of this information yesterday. She informed me that she will start the metformin now.

## 2023-01-10 ENCOUNTER — Other Ambulatory Visit: Payer: Self-pay | Admitting: Internal Medicine

## 2023-01-10 DIAGNOSIS — Z7901 Long term (current) use of anticoagulants: Secondary | ICD-10-CM

## 2023-01-10 DIAGNOSIS — I10 Essential (primary) hypertension: Secondary | ICD-10-CM

## 2023-01-10 DIAGNOSIS — I480 Paroxysmal atrial fibrillation: Secondary | ICD-10-CM

## 2023-01-23 ENCOUNTER — Telehealth: Payer: Self-pay | Admitting: Pharmacist

## 2023-01-23 NOTE — Progress Notes (Signed)
Care Management & Coordination Services Pharmacy Team  Reason for Encounter: Appointment Reminder  Contacted patient to confirm in office appointment with Erskine Emery, PharmD on [address] at 9 am. Spoke with patient on 01/24/2023   Do you have any problems getting your medications? No If yes what types of problems are you experiencing?  None  What is your top health concern you would like to discuss at your upcoming visit? Patient states she does have atrial fibrillation. She states she would also like to lose some weight. Patient states Reginal Lutes is "too expensive".  Have you seen any other providers since your last visit with PCP? No   Chart review:  Recent office visits:  12/09/2022 OV (PCP) Lula Olszewski, MD;  Try generics metformin/topiramate Avoid Wellbutrin/phentermine due to heart   08/19/2022 OV (Fam Med) Jarold Motto, [place];  did however provide pocket rx for oral azithromcyin should symptoms not improve as anticipated.   Recent consult visits:  None  Hospital visits:  None in previous 6 months   Star Rating Drugs:  Losartan 50 mg - HCTZ 12.5 mg last filled 11/17/2022 90 DS Metformin 500 mg last filled 12/09/2022 73 DS   Care Gaps: Annual wellness visit in last year? Yes   Future Appointments  Date Time Provider Department Center  01/27/2023  9:00 AM Erroll Luna, [place] [facility] None  06/16/2023  8:40 AM Lula Olszewski, MD [facility]   April D Calhoun, [facility] Clinical Pharmacist Assistant [phone]

## 2023-01-27 ENCOUNTER — Ambulatory Visit: Payer: PPO | Admitting: Pharmacist

## 2023-01-27 NOTE — Progress Notes (Signed)
Care Management & Coordination Services Pharmacy Note  01/27/2023 Name:  [name] MRN:  [id number] DOB:  09-30-1945  Summary: PharmD initial visit.  Discussed medications at length.  Patient with Afib and current LDL of 104.  Goal for these patients are < 70 - she has been on statin previously but no recent record of one.  No DEXA scan on file.  Discussed implementation of physical activity and some diet mods that she could work on.  She was out of Vitamin D 50,000 IU but her vitamin D was normal at most recent labs.  Recommendations/Changes made from today's visit: Resume Vit D supplementation at OTC strength - [phone] IU daily Start Crestor 5mg  three times per week to see if tolerate then can titrate up Schedule DEXA scan  Follow up plan: Fu with me in 3 months CMA to call in 30 days to assess med tolerance, and see if we have gotten DEXA scheduled.  She will also check on exercise plan!   Subjective: [name] is an 77 y.o. year old female who is a primary patient of Jon Billings, Johnella Moloney, MD.  The care coordination team was consulted for assistance with disease management and care coordination needs.    Engaged with patient face to face for initial visit.  Recent office visits:  12/09/2022 OV (PCP) Lula Olszewski, MD;  Try generics metformin/topiramate Avoid Wellbutrin/phentermine due to heart    08/19/2022 OV (Fam Med) Jarold Motto, [place];  did however provide pocket rx for oral azithromcyin should symptoms not improve as anticipated.    Recent consult visits:  None   Hospital visits:  None in previous 6 months   Objective:  Lab Results  Component Value Date   CREATININE 0.70 12/09/2022   BUN 13 12/09/2022   GFR 84.84 12/09/2022   GFRNONAA 63 06/06/2019   GFRAA 72 06/06/2019   NA 140 12/09/2022   K 4.1 12/09/2022   CALCIUM 9.2 12/09/2022   CO2 30 12/09/2022   GLUCOSE 103 (H) 12/09/2022    Lab Results  Component Value Date/Time   HGBA1C 5.4  06/10/2022 02:47 PM   GFR 84.84 12/09/2022 09:04 AM   GFR 74.77 06/10/2022 02:47 PM    Last diabetic Eye exam: No results found for: "HMDIABEYEEXA"  Last diabetic Foot exam: No results found for: "HMDIABFOOTEX"   Lab Results  Component Value Date   CHOL 174 12/09/2022   HDL 50.90 12/09/2022   LDLCALC 104 (H) 12/09/2022   TRIG 99.0 12/09/2022   CHOLHDL 3 12/09/2022       Latest Ref Rng & Units 12/09/2022    9:04 AM 06/10/2022    2:47 PM 10/11/2010    3:30 PM  Hepatic Function  Total Protein 6.0 - 8.3 g/dL 6.6  7.0  7.4   Albumin 3.5 - 5.2 g/dL 3.9  4.0  3.6   AST 0 - 37 U/L 24  25  29    ALT 0 - 35 U/L 18  19  27    Alk Phosphatase 39 - 117 U/L 98  100  127   Total Bilirubin 0.2 - 1.2 mg/dL 0.7  0.5  0.5     Lab Results  Component Value Date/Time   TSH 3.97 06/10/2022 02:47 PM       Latest Ref Rng & Units 12/09/2022    9:04 AM 06/10/2022    2:47 PM 09/10/2021   10:16 AM  CBC  WBC 4.0 - 10.5 K/uL 6.2  6.5  5.8  Hemoglobin 12.0 - 15.0 g/dL 91.4  78.2  95.6   Hematocrit 36.0 - 46.0 % 41.6  40.2  41.5   Platelets 150.0 - 400.0 K/uL 243.0  231.0  249     Lab Results  Component Value Date/Time   VD25OH 40.59 12/09/2022 09:04 AM    Clinical ASCVD: No  The 10-year ASCVD risk score (Arnett DK, et al., 2019) is: 18.5%   Values used to calculate the score:     Age: 25 years     Sex: Female     Is Non-Hispanic African American: No     Diabetic: No     Tobacco smoker: No     Systolic Blood Pressure: 120 mmHg     Is BP treated: Yes     HDL Cholesterol: 50.9 mg/dL     Total Cholesterol: 174 mg/dL        10/06/3084    5:78 AM 08/02/2022    2:33 PM  Depression screen PHQ 2/9  Decreased Interest 0 0  Down, Depressed, Hopeless 0 0  PHQ - 2 Score 0 0  Altered sleeping 0   Tired, decreased energy 2   Change in appetite 0   Feeling bad or failure about yourself  0   Trouble concentrating 1   Moving slowly or fidgety/restless 0   Suicidal thoughts 0   PHQ-9 Score 3    Difficult doing work/chores Not difficult at all      Social History   Tobacco Use  Smoking Status Never  Smokeless Tobacco Never   BP Readings from Last 3 Encounters:  01/27/23 120/62  12/09/22 106/64  08/19/22 112/70   Pulse Readings from Last 3 Encounters:  12/09/22 66  08/19/22 67  07/08/22 60   Wt Readings from Last 3 Encounters:  12/09/22 238 lb 9.6 oz (108.2 kg)  08/19/22 234 lb 6.1 oz (106.3 kg)  08/02/22 230 lb (104.3 kg)   BMI Readings from Last 3 Encounters:  12/09/22 42.27 kg/m  08/19/22 41.52 kg/m  08/02/22 40.74 kg/m    Allergies  Allergen Reactions   Codeine Nausea And Vomiting   Misc. Sulfonamide Containing Compounds Other (See Comments) and Rash   Sulfa Antibiotics Rash    Medications Reviewed Today     Reviewed by Erroll Luna, [facility] (Pharmacist) on 01/27/23 at 1303  Med List Status: <None>   Medication Order Taking? Sig Documenting Provider Last Dose Status Informant  albuterol (PROVENTIL) (2.5 MG/3ML) 0.083% nebulizer solution [id number]  Take 3 mLs (2.5 mg total) by nebulization every 6 (six) hours as needed for wheezing or shortness of breath. Jarold Motto, [place]  Active   albuterol (VENTOLIN HFA) 108 (90 Base) MCG/ACT inhaler [id number]  ProAir HFA 90 mcg/actuation aerosol inhaler  INHALE 1 TO 2 PUFFS EVERY 4 TO 6 HOURS AS NEEDED 30 DAYS  Patient taking differently: Inhale 1-2 puffs into the lungs every 6 (six) hours as needed for wheezing or shortness of breath. ProAir HFA 90 mcg/actuation aerosol inhaler  INHALE 1 TO 2 PUFFS EVERY 4 TO 6 HOURS AS NEEDED 30 DAYS   Lula Olszewski, MD  Active   apixaban (ELIQUIS) 5 MG TABS tablet [id number]  Take 1 tablet (5 mg total) by mouth 2 (two) times daily. Lula Olszewski, MD  Active   furosemide (LASIX) 20 MG tablet [id number]  Take 1 tablet (20 mg total) by mouth 3 (three) times a week. As needed for fluid Lula Olszewski, MD  Active   losartan-hydrochlorothiazide [facility]) 50-12.5  MG tablet  [id number]  Take 1 tablet by mouth daily. Lula Olszewski, MD  Active   metFORMIN (GLUCOPHAGE) 500 MG tablet [id number]  Take 1 tablet (500 mg total) by mouth 2 (two) times daily with a meal. To start: just half a tablet daily for 2 weeks. Lula Olszewski, MD  Active   mupirocin ointment (BACTROBAN) 2 % [id number]  Apply topically. [provider]  Active   ondansetron (ZOFRAN-ODT) 4 MG disintegrating tablet [id number]  Take 1 tablet (4 mg total) by mouth every 8 (eight) hours as needed for nausea or vomiting (for nausea from wegovy or other source). Lula Olszewski, MD  Active   pantoprazole (PROTONIX) 40 MG tablet [id number]  Take 1 tablet (40 mg total) by mouth daily as needed (acid reflux). Lula Olszewski, MD  Active   potassium chloride (KLOR-CON) 10 MEQ tablet [id number]  Take 1 tablet (10 mEq total) by mouth 3 (three) times a week. Lula Olszewski, MD  Active   topiramate (TOPAMAX) 25 MG tablet [id number]  Take 1 tablet (25 mg total) by mouth 2 (two) times daily. To start: just 1 tablet daily for a week Lula Olszewski, MD  Active   verapamil (CALAN) 80 MG tablet [id number]  Take 1 tablet (80 mg total) by mouth 2 (two) times daily. Lula Olszewski, MD  Active   Vitamin D, Ergocalciferol, (DRISDOL) 1.25 MG (50000 UNIT) CAPS capsule [id number]  TAKE 1 CAPSULE BY MOUTH 2 TIMES A WEEK  Patient taking differently: Take 50,000 Units by mouth 2 (two) times a week. TAKE 1 CAPSULE BY MOUTH 2 TIMES A WEEK   Lula Olszewski, MD  Active             SDOH:  (Social Determinants of Health) assessments and interventions performed: Yes Financial Resource Strain: Low Risk  (01/27/2023)   Overall Financial Resource Strain (CARDIA)    Difficulty of Paying Living Expenses: Not hard at all   Food Insecurity: No Food Insecurity (01/27/2023)   Hunger Vital Sign    Worried About Running Out of Food in the Last Year: Never true    Ran Out of Food in the Last Year: Never true    SDOH Interventions     Flowsheet Row Clinical Support from 08/02/2022 in [facility]  SDOH Interventions   Food Insecurity Interventions Intervention Not Indicated  Housing Interventions Intervention Not Indicated  Transportation Interventions Intervention Not Indicated  Financial Strain Interventions Intervention Not Indicated  Stress Interventions Intervention Not Indicated  Social Connections Interventions Intervention Not Indicated       Medication Assistance: None required.  Patient affirms current coverage meets needs.  Medication Access: Name and location of current pharmacy:  [id number] [place], [place] - [address] [place] [address] Phone: [phone] Fax: [phone]  Within the past 30 days, how often has patient missed a dose of medication? 0 Is a pillbox or other method used to improve adherence? Yes  Factors that may affect medication adherence? no barriers identified Are meds synced by current pharmacy? No  Are meds delivered by current pharmacy? No  Does patient experience delays in picking up medications due to transportation concerns? No   Compliance/Adherence/Medication fill history: Star Rating Drugs:  Losartan 50 mg - HCTZ 12.5 mg last filled 11/17/2022 90 DS Metformin 500 mg last filled 12/09/2022 73 DS     Care Gaps: Annual wellness visit in last year? Yes   Assessment/Plan  Hyperlipidemia: (LDL goal < 70) -Uncontrolled, most recent LDL is 104 -Current treatment: None -Medications previously tried: statin (unknown which one)  -Current dietary patterns:  Breakfast - usually home cooked, grits, toast, bacon Lunch - likes to dine out at [employer], sometimes salad Dinner - usually eat a late lunch and skip Drinks - occasional wine -Current exercise habits: minimal exercise.  Busy with work and do not have much time to exercise.  They do have stationary bike at home they have just not started using  it. -Educated on Cholesterol goals;  Benefits of statin for ASCVD risk reduction; Importance of limiting foods high in cholesterol;  -Recommended starting statin medication for stroke risk reduction.  Currentl ASCVD risk is 14.7% - moderate risk.  We discussed history and she was on statin before a while back.  Discussed starting with 3 times per week and titration based on tolerance.  Recommend Crestor 5mg  three times per week.  Recheck lipid panel in 6 weeks to see if LDL has improved.  Will coordinate with PCP. CMA to call to check on tolerance in 2-3 weeks.  Atrial Fibrillation (Goal: prevent stroke and major bleeding) -Controlled -Current treatment: Rate control: Verapamil 80mg  Appropriate, Effective, Safe, Accessible Anticoagulation: Eliquis 5mg  bid Appropriate, Effective, Safe, Accessible -Medications previously tried: metoprolol  -Home BP and HR readings: see HTN  -Counseled on increased risk of stroke due to Afib and benefits of anticoagulation for stroke prevention; importance of adherence to anticoagulant exactly as prescribed; -Counseled on diet and exercise extensively Recommended to continue current medication Monitor BP and HR.  Continue current medications for Afib.  Need to reduce risk factors for stroke - see HLD we are working on reduction LDL.  Recommend exercise on stationary bike starting with 2 times per day for 30 minutes - and increase based on tolerability.  Will have CMA check on progress in 30 days.   Within 30 days initiate physical activity of at least 60 minutes per week, we can work up from there.  Obesity (Goal: Improve BMI, reduce stroke risk) -Not ideally controlled -Current treatment  Metformin 250mg  bid Appropriate, Effective, Safe, Accessible -Medications previously tried: Topiramate (jittery, Wegovy ($)  - She is trying to lose weight, Reginal Lutes was too expensive.  Recently started on metformin and topamax.  Reports feeling sick, most likely due to  topamax.  Discussed trying metformin only to see if she is able to tolerate this.  No issues with blood sugar in the past.  For now will hold on GLP-1 due to cost.  Really stressed diet and exercise with her today.  Try to implement physical activity plan within 30 days - CMA to FU on this.  Also, try to limit carbs at breakfast (grits and toast previously) - try to limit grits and add a protein.    Health Maintenance  -Recommended patient get DEXA scan since this has never been done before or we have no record.  She is agreeable to this - will coordinate with PCP.   Willa Frater, PharmD Clinical Pharmacist  [facility] [phone]

## 2023-01-30 ENCOUNTER — Other Ambulatory Visit: Payer: Self-pay | Admitting: Internal Medicine

## 2023-01-30 DIAGNOSIS — Z780 Asymptomatic menopausal state: Secondary | ICD-10-CM

## 2023-02-02 ENCOUNTER — Other Ambulatory Visit: Payer: Self-pay | Admitting: Internal Medicine

## 2023-02-02 ENCOUNTER — Telehealth: Payer: Self-pay | Admitting: Pharmacist

## 2023-02-02 ENCOUNTER — Other Ambulatory Visit: Payer: Self-pay | Admitting: Family Medicine

## 2023-02-02 DIAGNOSIS — I480 Paroxysmal atrial fibrillation: Secondary | ICD-10-CM

## 2023-02-02 DIAGNOSIS — Z0000 Encounter for general adult medical examination without abnormal findings: Secondary | ICD-10-CM

## 2023-02-02 MED ORDER — ROSUVASTATIN CALCIUM 5 MG PO TABS
5.0000 mg | ORAL_TABLET | ORAL | 3 refills | Status: DC
Start: 2023-02-03 — End: 2023-06-16

## 2023-02-02 NOTE — Telephone Encounter (Signed)
Crestor 5mg  three times per week sent in to pharmacy.  Patient to be notified.  Also reminded her to call to schedule DEXA.  Willa Frater, PharmD Clinical Pharmacist  [facility] [phone]

## 2023-02-02 NOTE — Telephone Encounter (Signed)
-----   Message from Lula Olszewski, MD sent at 01/30/2023  4:06 PM EDT ----- Love the plan I put order for DEXA, please follow up on this and the medication(s) changes your advocating for I do agree please send and titrate statin. ----- Message ----- From: Erroll Luna, [facility] Sent: 01/27/2023   1:06 PM EDT To: Lula Olszewski, MD   Patient with Afib and current LDL of 104.  Goal for these patients are < 70 - she has been on statin previously but no recent record of one.  No DEXA scan on file.  Discussed implementation of physical activity and some diet mods that she could work on.  She was out of Vitamin D 50,000 IU but her vitamin D was normal at most recent labs.  Recommendations/Changes made from today's visit: Resume Vit D supplementation at OTC strength - [phone] IU daily Start Crestor 5mg  three times per week to see if tolerate then can titrate up Coordinate PCP to place order for DEXA scan.  Ok with this plan?  I can call in rx if you agree, can't do DEXA but if you put in order I will FU on scheduling.  Thanks!

## 2023-02-03 ENCOUNTER — Ambulatory Visit
Admission: RE | Admit: 2023-02-03 | Discharge: 2023-02-03 | Disposition: A | Discharge status: Home / Self Care | Payer: PPO | Source: Ambulatory Visit | Attending: Internal Medicine | Admitting: Internal Medicine

## 2023-02-03 DIAGNOSIS — Z1231 Encounter for screening mammogram for malignant neoplasm of breast: Secondary | ICD-10-CM | POA: Diagnosis not present

## 2023-02-03 DIAGNOSIS — Z0000 Encounter for general adult medical examination without abnormal findings: Secondary | ICD-10-CM

## 2023-02-16 ENCOUNTER — Ambulatory Visit (INDEPENDENT_AMBULATORY_CARE_PROVIDER_SITE_OTHER): Payer: PPO | Admitting: Internal Medicine

## 2023-02-16 ENCOUNTER — Encounter: Payer: Self-pay | Admitting: Internal Medicine

## 2023-02-16 VITALS — BP 130/80 | HR 60 | Temp 98.4°F | Ht 63.0 in | Wt 234.8 lb

## 2023-02-16 DIAGNOSIS — B351 Tinea unguium: Secondary | ICD-10-CM | POA: Diagnosis not present

## 2023-02-16 DIAGNOSIS — Z6841 Body Mass Index (BMI) 40.0 and over, adult: Secondary | ICD-10-CM | POA: Diagnosis not present

## 2023-02-16 DIAGNOSIS — Z9189 Other specified personal risk factors, not elsewhere classified: Secondary | ICD-10-CM | POA: Diagnosis not present

## 2023-02-16 DIAGNOSIS — I480 Paroxysmal atrial fibrillation: Secondary | ICD-10-CM | POA: Diagnosis not present

## 2023-02-16 HISTORY — DX: Tinea unguium: B35.1

## 2023-02-16 MED ORDER — SEMAGLUTIDE-WEIGHT MANAGEMENT 1.7 MG/0.75ML ~~LOC~~ SOAJ
1.7000 mg | SUBCUTANEOUS | 0 refills | Status: AC
Start: 2023-05-14 — End: 2023-06-11

## 2023-02-16 MED ORDER — SEMAGLUTIDE-WEIGHT MANAGEMENT 0.25 MG/0.5ML ~~LOC~~ SOAJ
0.2500 mg | SUBCUTANEOUS | 0 refills | Status: AC
Start: 2023-02-16 — End: 2023-03-16

## 2023-02-16 MED ORDER — SEMAGLUTIDE-WEIGHT MANAGEMENT 1 MG/0.5ML ~~LOC~~ SOAJ
1.0000 mg | SUBCUTANEOUS | 0 refills | Status: AC
Start: 2023-04-15 — End: 2023-05-13

## 2023-02-16 MED ORDER — SEMAGLUTIDE-WEIGHT MANAGEMENT 0.5 MG/0.5ML ~~LOC~~ SOAJ
0.5000 mg | SUBCUTANEOUS | 0 refills | Status: AC
Start: 2023-03-17 — End: 2023-04-14

## 2023-02-16 MED ORDER — SEMAGLUTIDE-WEIGHT MANAGEMENT 2.4 MG/0.75ML ~~LOC~~ SOAJ
2.4000 mg | SUBCUTANEOUS | 0 refills | Status: AC
Start: 2023-06-12 — End: 2023-07-10

## 2023-02-16 MED ORDER — TERBINAFINE HCL 250 MG PO TABS
250.0000 mg | ORAL_TABLET | Freq: Every day | ORAL | 0 refills | Status: DC
Start: 2023-02-16 — End: 2024-02-09

## 2023-02-16 NOTE — Progress Notes (Signed)
[name]: [phone]   Routine Medical Office Visit  Patient:  [name]      Age: 77 y.o.       Sex:  female  Date:   02/16/2023 PCP:    Erika Olszewski, MD   Today's Healthcare Provider: Lula Olszewski, MD   Assessment and Plan:   AI-Extracted Assessment and Plan    Onychomycosis: Recurrence of fungal infection in the toenail is noted, previously treated with oral antifungal medication, likely terbinafine, for 3 months. They prefer oral medication over topical treatment. We will prescribe terbinafine for 12 weeks and have advised them of potential side effects including liver damage, skin reactions, taste and smell disturbances, and blood cell changes. Recent liver function tests and blood counts are normal, indicating no need for additional blood work.  Weight Management: They were previously on metformin but discontinued due to side effects. Their cardiologist recommended [id number], yet insurance has not approved it. We will attempt to get Wegovy approved by insurance again and discussed the potential of obtaining a CT coronary calcium score to leverage insurance approval for [facility].  ADHD Management (Patient's Daughter): Their adult daughter has a history of ADHD and is seeking a primary care provider who can manage her ADHD medication. If she establishes care, we consider prescribing Vyvanse, pending review of previous medical records.      Charting-Extracted Assessment and Plan [name] was seen today for fungus of great right toe.  Onychomycosis -     Terbinafine HCl; Take 1 tablet (250 mg total) by mouth daily. For toenail fungal infection  Dispense: 84 tablet; Refill: 0  Intermediate risk for coronary artery disease Overview: The 10-year ASCVD risk score (Arnett DK, et al., 2019) is: 21.4%   Values used to calculate the score:     Age: 46 years     Sex: Female     Is Non-Hispanic African American: No     Diabetic: No     Tobacco smoker:  No     Systolic Blood Pressure: 130 mmHg     Is BP treated: Yes     HDL Cholesterol: 50.9 mg/dL     Total Cholesterol: 174 mg/dL   Assessment & Plan: Trying to ramp Wegovy, rosuvastatin Taking eliquis due to atrial fibrillation. Reviewed risk and will get CT coronary calcium to help get [facility] and confirm coronary artery disease due to FDA approved cardiac indication for [facility].  Orders: -     Semaglutide-Weight Management; Inject 0.25 mg into the skin once a week for 28 days.  Dispense: 2 mL; Refill: 0 -     Semaglutide-Weight Management; Inject 0.5 mg into the skin once a week for 28 days.  Dispense: 2 mL; Refill: 0 -     Semaglutide-Weight Management; Inject 1 mg into the skin once a week for 28 days.  Dispense: 2 mL; Refill: 0 -     Semaglutide-Weight Management; Inject 1.7 mg into the skin once a week for 28 days.  Dispense: 3 mL; Refill: 0 -     Semaglutide-Weight Management; Inject 2.4 mg into the skin once a week for 28 days.  Dispense: 3 mL; Refill: 0 -     CT CARDIAC SCORING (SELF PAY ONLY); Future  Morbid obesity (HCC) Overview: Unable to get [facility] 1600$   Paroxysmal atrial fibrillation (HCC) Overview: 12/09/22. Patient reports compliance with eliquis, patient reports that they are not experiencing any symptom(s) but does have chronic shortness of breath "because I'm fat"  paroxysmal, evaluated by Dr. Dulce Sellar 07/2022 Plan is continue anticoag, CCB, BP control medications  Orders: -     Semaglutide-Weight Management; Inject 0.25 mg into the skin once a week for 28 days.  Dispense: 2 mL; Refill: 0 -     Semaglutide-Weight Management; Inject 0.5 mg into the skin once a week for 28 days.  Dispense: 2 mL; Refill: 0 -     Semaglutide-Weight Management; Inject 1 mg into the skin once a week for 28 days.  Dispense: 2 mL; Refill: 0 -     Semaglutide-Weight Management; Inject 1.7 mg into the skin once a week for 28 days.  Dispense: 3 mL; Refill: 0 -     Semaglutide-Weight  Management; Inject 2.4 mg into the skin once a week for 28 days.  Dispense: 3 mL; Refill: 0 -     CT CARDIAC SCORING (SELF PAY ONLY); Future           Clinical Presentation:    77 y.o. female here today for Fungus of great right toe (Recurrent.)  AI-Extracted: Discussed the use of AI scribe software for clinical note transcription with the patient, who gave verbal consent to proceed.  History of Present Illness   The patient presents with a recurrent toenail fungal infection, previously treated with an unidentified oral antifungal medication. They report a history of successful treatment with a daily oral antifungal medication for a duration of three months, followed by an additional three-month course due to incomplete resolution. The patient expresses dissatisfaction with over-the-counter treatments, stating they are ineffective.  In addition to the fungal infection, the patient reports discontinuation of metformin due to intolerable side effects, including excessive fatigue and somnolence. They also mention a history of atrial fibrillation, managed by an unidentified medication.  The patient has a family history of ADHD, with three daughters diagnosed with the condition. One of the daughters, an adult, is currently seeking treatment for her ADHD symptoms. The patient also mentions a history of substance abuse in the family, specifically cocaine addiction in a former son-in-law.  The patient is currently undergoing home renovations, including the removal of popcorn ceilings, which they describe as a messy process. They express concern about the accessibility of mental health services, particularly for adults, and the high cost of counseling services.      Reviewed chart data: Active Ambulatory Problems    Diagnosis Date Noted   Fecal occult blood test positive 12/06/2018   PAF (paroxysmal atrial fibrillation) (HCC) 12/10/2018   Chronic anticoagulation 01/24/2019   Cervical radiculopathy  02/23/2018   DDD (degenerative disc disease), cervical 03/23/2018   Hypertension 01/31/2020   Heart murmur    Atrial fibrillation (HCC) 01/31/2020   Anxiety 06/10/2022   Pain of right shoulder joint on movement 01/19/2021   Morbid obesity (HCC) 06/10/2022   Leg cramps 12/09/2022   Wheezing 12/09/2022   Vitamin D deficiency 12/09/2022   Hemorrhoids 12/09/2022   History of colon polyps 12/09/2022   Intermediate risk for coronary artery disease 02/16/2023   Onychomycosis 02/16/2023   Resolved Ambulatory Problems    Diagnosis Date Noted   Chaotic atrial rhythm 10/19/2018   Hypertensive heart disease without heart failure 12/10/2018   SVT (supraventricular tachycardia) 04/24/2019   No Additional Past Medical History    Outpatient Medications Prior to Visit  Medication Sig   albuterol (PROVENTIL) (2.5 MG/3ML) 0.083% nebulizer solution Take 3 mLs (2.5 mg total) by nebulization every 6 (six) hours as needed for wheezing or shortness of breath.  albuterol (VENTOLIN HFA) 108 (90 Base) MCG/ACT inhaler ProAir HFA 90 mcg/actuation aerosol inhaler  INHALE 1 TO 2 PUFFS EVERY 4 TO 6 HOURS AS NEEDED 30 DAYS (Patient taking differently: Inhale 1-2 puffs into the lungs every 6 (six) hours as needed for wheezing or shortness of breath. ProAir HFA 90 mcg/actuation aerosol inhaler  INHALE 1 TO 2 PUFFS EVERY 4 TO 6 HOURS AS NEEDED 30 DAYS)   apixaban (ELIQUIS) 5 MG TABS tablet Take 1 tablet (5 mg total) by mouth 2 (two) times daily.   furosemide (LASIX) 20 MG tablet Take 1 tablet (20 mg total) by mouth 3 (three) times a week. As needed for fluid   losartan-hydrochlorothiazide (HYZAAR) 50-12.5 MG tablet Take 1 tablet by mouth daily.   mupirocin ointment (BACTROBAN) 2 % Apply topically.   ondansetron (ZOFRAN-ODT) 4 MG disintegrating tablet Take 1 tablet (4 mg total) by mouth every 8 (eight) hours as needed for nausea or vomiting (for nausea from wegovy or other source).   pantoprazole (PROTONIX) 40 MG  tablet Take 1 tablet (40 mg total) by mouth daily as needed (acid reflux).   potassium chloride (KLOR-CON) 10 MEQ tablet Take 1 tablet (10 mEq total) by mouth 3 (three) times a week.   rosuvastatin (CRESTOR) 5 MG tablet Take 1 tablet (5 mg total) by mouth 3 (three) times a week.   topiramate (TOPAMAX) 25 MG tablet Take 1 tablet (25 mg total) by mouth 2 (two) times daily. To start: just 1 tablet daily for a week   verapamil (CALAN) 80 MG tablet Take 1 tablet (80 mg total) by mouth 2 (two) times daily.   [DISCONTINUED] metFORMIN (GLUCOPHAGE) 500 MG tablet Take 1 tablet (500 mg total) by mouth 2 (two) times daily with a meal. To start: just half a tablet daily for 2 weeks.   No facility-administered medications prior to visit.         Clinical Data Analysis:   Physical Exam  BP 130/80 (BP Location: Left Arm, Patient Position: Sitting)   Pulse 60   Temp 98.4 F (36.9 C) (Temporal)   Ht 5\' 3"  (1.6 m)   Wt 234 lb 12.8 oz (106.5 kg)   SpO2 96%   BMI 41.59 kg/m  Wt Readings from Last 10 Encounters:  02/16/23 234 lb 12.8 oz (106.5 kg)  12/09/22 238 lb 9.6 oz (108.2 kg)  08/19/22 234 lb 6.1 oz (106.3 kg)  08/02/22 230 lb (104.3 kg)  07/08/22 234 lb (106.1 kg)  06/10/22 231 lb 3.2 oz (104.9 kg)  09/10/21 232 lb 12.8 oz (105.6 kg)  05/07/21 232 lb 1.3 oz (105.3 kg)  10/09/20 231 lb (104.8 kg)  04/17/20 231 lb (104.8 kg)   Vital signs reviewed.  Nursing notes reviewed. Weight trend reviewed. Abnormalities and Problem-Specific physical exam findings:  truncal adiposity   General Appearance:  No acute distress appreciable.   Well-groomed, healthy-appearing female.  Well proportioned with no abnormal fat distribution.  Good muscle tone. Skin: Clear and well-hydrated. Pulmonary:  Normal work of breathing at rest, no respiratory distress apparent. SpO2: 96 %  Musculoskeletal: All extremities are intact.  Neurological:  Awake, alert, oriented, and engaged.  No obvious focal neurological deficits  or cognitive impairments.  Sensorium seems unclouded.   Speech is clear and coherent with logical content. Psychiatric:  Appropriate mood, pleasant and cooperative demeanor, thoughtful and engaged during the exam  Results Reviewed:    No results found for any visits on 02/16/23.  Recent Results (from the past 2160  hour(s))  CBC     Status: None   Collection Time: 12/09/22  9:04 AM  Result Value Ref Range   WBC 6.2 4.0 - 10.5 K/uL   RBC 4.54 3.87 - 5.11 Mil/uL   Platelets 243.0 150.0 - 400.0 K/uL   Hemoglobin 13.9 12.0 - 15.0 g/dL   HCT 16.1 09.6 - 04.5 %   MCV 91.5 78.0 - 100.0 fl   MCHC 33.5 30.0 - 36.0 g/dL   RDW 40.9 81.1 - 91.4 %  Comprehensive metabolic panel     Status: Abnormal   Collection Time: 12/09/22  9:04 AM  Result Value Ref Range   Sodium 140 135 - 145 mEq/L   Potassium 4.1 3.5 - 5.1 mEq/L   Chloride 103 96 - 112 mEq/L   CO2 30 19 - 32 mEq/L   Glucose, Bld 103 (H) 70 - 99 mg/dL   BUN 13 6 - 23 mg/dL   Creatinine, Ser 7.82 0.40 - 1.20 mg/dL   Total Bilirubin 0.7 0.2 - 1.2 mg/dL   Alkaline Phosphatase 98 39 - 117 U/L   AST 24 0 - 37 U/L   ALT 18 0 - 35 U/L   Total Protein 6.6 6.0 - 8.3 g/dL   Albumin 3.9 3.5 - 5.2 g/dL   GFR 95.62 >13.08 mL/min    Comment: Calculated using the CKD-EPI Creatinine Equation (2021)   Calcium 9.2 8.4 - 10.5 mg/dL  Lipid panel     Status: Abnormal   Collection Time: 12/09/22  9:04 AM  Result Value Ref Range   Cholesterol 174 0 - 200 mg/dL    Comment: ATP III Classification       Desirable:  < 200 mg/dL               Borderline High:  200 - 239 mg/dL          High:  > = 657 mg/dL   Triglycerides 84.6 0.0 - 149.0 mg/dL    Comment: Normal:  <962 mg/dLBorderline High:  150 - 199 mg/dL   HDL 95.28 >41.32 mg/dL   VLDL 44.0 0.0 - 10.2 mg/dL   LDL Cholesterol 725 (H) 0 - 99 mg/dL   Total CHOL/HDL Ratio 3     Comment:                Men          Women1/2 Average Risk     3.4          3.3Average Risk          5.0          4.42X Average  Risk          9.6          7.13X Average Risk          15.0          11.0                       NonHDL 123.34     Comment: NOTE:  Non-HDL goal should be 30 mg/dL higher than patient's LDL goal (i.e. LDL goal of < 70 mg/dL, would have non-HDL goal of < 100 mg/dL)  Magnesium     Status: None   Collection Time: 12/09/22  9:04 AM  Result Value Ref Range   Magnesium 1.8 1.5 - 2.5 mg/dL  VITAMIN D 25 Hydroxy (Vit-D Deficiency, Fractures)     Status: None   Collection Time: 12/09/22  9:04 AM  Result Value Ref Range   VITD 40.59 30.00 - 100.00 ng/mL     No image results found.   MM 3D SCREENING MAMMOGRAM BILATERAL BREAST  Result Date: 02/07/2023 CLINICAL DATA:  Screening. EXAM: DIGITAL SCREENING BILATERAL MAMMOGRAM WITH TOMOSYNTHESIS AND CAD TECHNIQUE: Bilateral screening digital craniocaudal and mediolateral oblique mammograms were obtained. Bilateral screening digital breast tomosynthesis was performed. The images were evaluated with computer-aided detection. COMPARISON:  Previous exam(s). ACR Breast Density Category a: The breasts are almost entirely fatty. FINDINGS: There are no findings suspicious for malignancy. IMPRESSION: No mammographic evidence of malignancy. A result letter of this screening mammogram will be mailed directly to the patient. RECOMMENDATION: Screening mammogram in one year. (Code:SM-[id number]) BI-RADS CATEGORY  1: Negative. Electronically Signed   By: Elberta Fortis M.D.   On: 02/07/2023 07:58       This encounter employed real-time, collaborative documentation. The patient actively reviewed and updated their medical record on a shared screen, ensuring transparency and facilitating joint problem-solving for the problem list, overview, and plan. This approach promotes accurate, informed care. The treatment plan was discussed and reviewed in detail, including medication safety, potential side effects, and all patient questions. We confirmed understanding and comfort with the plan.  Follow-up instructions were established, including contacting the office for any concerns, returning if symptoms worsen, persist, or new symptoms develop, and precautions for potential emergency department visits. ----------------------------------------------------- Erika Olszewski, MD  02/17/2023 7:33 PM  [facility]:  [phone]

## 2023-02-16 NOTE — Patient Instructions (Addendum)
It was a pleasure seeing you today! Your health and satisfaction are our top priorities.   Glenetta Hew, MD  Risks of Terbinafine: Liver Damage: Terbinafine can rarely cause severe liver damage. Symptoms might include nausea, upper stomach pain, itching, tired feeling, loss of appetite, dark urine, clay-colored stools, and jaundice. Skin Reactions: Serious skin reactions (e.g., Stevens-Johnson Syndrome) can occur. Taste and Smell Disturbance: Some patients may experience changes in their sense of taste or smell, which are usually reversible upon discontinuation of the medication. Hematologic Effects: Rare cases of neutropenia, agranulocytosis, thrombocytopenia, and anemia have been reported.   VISIT SUMMARY:  During your visit, we discussed your recurrent toenail fungal infection, your discontinuation of metformin due to side effects, and your daughter's ADHD management. We also touched on your concerns about the high cost of counseling services.  YOUR PLAN:  -TOENAIL FUNGAL INFECTION: We will prescribe an oral antifungal medication, likely terbinafine, for 12 weeks to treat your toenail fungal infection. This medication can have side effects like liver damage, skin reactions, changes in taste and smell, and changes in blood cells. However, your recent liver function tests and blood counts are normal, so we don't anticipate any issues.  -WEIGHT MANAGEMENT: We discussed trying to get approval from your insurance for [facility], a medication recommended by your cardiologist for weight management. We also discussed the possibility of getting a CT coronary calcium score, which is a test that can help [place] understand your risk of heart disease, to support the approval process for [facility]  -ADHD MANAGEMENT (PATIENT'S DAUGHTER): If your adult daughter establishes care with [place], we can consider prescribing Vyvanse for her ADHD, pending a review of her previous medical records.  INSTRUCTIONS:  Please start  taking the prescribed antifungal medication as directed. We will also be working on Web designer for [employer] from [employer]. If your daughter decides to establish care with [place], please have her bring her previous medical records.  Next Steps:  [x]  Early Intervention: Schedule sooner appointment, call our on-call services, or go to emergency room if there is Increase in pain or discomfort New or worsening symptoms Sudden or severe changes in your health [x]  Flexible Follow-Up: We recommend a No follow-ups on file. for optimal routine care. This allows for progress monitoring and treatment adjustments. [x]  Preventive Care: Schedule your annual preventive care visit! It's typically covered by insurance and helps identify potential health issues early. [x]  Lab & X-ray Appointments: Incomplete tests scheduled today, or call to schedule. X-rays: [facility] (M-F, 8:30am-noon or 1pm-5pm). [x]  Medical Information Release: Sign a release form at front desk to obtain relevant medical information we don't have.  Making the Most of Our Focused (20 minute) Appointments:  [x]   Clearly state your top concerns at the beginning of the visit to focus our discussion [x]   If you anticipate you will need more time, please inform the front desk during scheduling - we can book multiple appointments in the same week. [x]   If you have transportation problems- use our convenient video appointments or ask about transportation support. [x]   We can get down to business faster if you use MyChart to update information before the visit and submit non-urgent questions before your visit. Thank you for taking the time to provide details through MyChart.  Let our nurse know and she can import this information into your encounter documents.  Arrival and Wait Times: [x]   Arriving on time ensures that everyone receives prompt attention. [x]   Early morning (8a)  and afternoon (1p) appointments tend to have  shortest wait times. [x]   Unfortunately, we cannot delay appointments for late arrivals or hold slots during phone calls.  Getting Answers and Following Up  [x]   Simple Questions & Concerns: For quick questions or basic follow-up after your visit, reach [place] at [phone] or MyChart messaging. [x]   Complex Concerns: If your concern is more complex, scheduling an appointment might be best. Discuss this with the staff to find the most suitable option. [x]   Lab & Imaging Results: We'll contact you directly if results are abnormal or you don't use MyChart. Most normal results will be on MyChart within 2-3 business days, with a review message from Dr. Jon Billings. Haven't heard back in 2 weeks? Need results sooner? Contact us at [phone]. [x]   Referrals: Our referral coordinator will manage specialist referrals. The specialist's office should contact you within 2 weeks to schedule an appointment. Call us if you haven't heard from them after 2 weeks.  Staying Connected  [x]   MyChart: Activate your MyChart for the fastest way to access results and message [place]. See the last page of this paperwork for instructions on how to activate.  Bring to Your Next Appointment  [x]   Medications: Please bring all your medication bottles to your next appointment to ensure we have an accurate record of your prescriptions. [x]   Health Diaries: If you're monitoring any health conditions at home, keeping a diary of your readings can be very helpful for discussions at your next appointment.  Billing  [x]   X-ray & Lab Orders: These are billed by separate companies. Contact the invoicing company directly for questions or concerns. [x]   Visit Charges: Discuss any billing inquiries with our administrative services team.  Your Satisfaction Matters  [x]   Share Your Experience: We strive for your satisfaction! If you have any complaints, or preferably compliments, please let Dr. Jon Billings know directly or contact our  Practice Administrators, Edwena Felty or [employer], by asking at the front desk.   Reviewing Your Records  [x]   Review this early draft of your clinical encounter notes below and the final encounter summary tomorrow on MyChart after its been completed.   Onychomycosis -     Terbinafine HCl; Take 1 tablet (250 mg total) by mouth daily. For toenail fungal infection  Dispense: 84 tablet; Refill: 0  Intermediate risk for coronary artery disease Assessment & Plan: Trying to ramp Wegovy, rosuvastatin Taking eliquis due to atrial fibrillation. Reviewed risk and will get CT coronary calcium to help get [facility] and confirm coronary artery disease due to FDA approved cardiac indication for [facility].  Orders: -     Semaglutide-Weight Management; Inject 0.25 mg into the skin once a week for 28 days.  Dispense: 2 mL; Refill: 0 -     Semaglutide-Weight Management; Inject 0.5 mg into the skin once a week for 28 days.  Dispense: 2 mL; Refill: 0 -     Semaglutide-Weight Management; Inject 1 mg into the skin once a week for 28 days.  Dispense: 2 mL; Refill: 0 -     Semaglutide-Weight Management; Inject 1.7 mg into the skin once a week for 28 days.  Dispense: 3 mL; Refill: 0 -     Semaglutide-Weight Management; Inject 2.4 mg into the skin once a week for 28 days.  Dispense: 3 mL; Refill: 0 -     CT CARDIAC SCORING (SELF PAY ONLY); Future  Morbid obesity (HCC)  Paroxysmal atrial fibrillation (HCC) -  Semaglutide-Weight Management; Inject 0.25 mg into the skin once a week for 28 days.  Dispense: 2 mL; Refill: 0 -     Semaglutide-Weight Management; Inject 0.5 mg into the skin once a week for 28 days.  Dispense: 2 mL; Refill: 0 -     Semaglutide-Weight Management; Inject 1 mg into the skin once a week for 28 days.  Dispense: 2 mL; Refill: 0 -     Semaglutide-Weight Management; Inject 1.7 mg into the skin once a week for 28 days.  Dispense: 3 mL; Refill: 0 -     Semaglutide-Weight Management; Inject 2.4  mg into the skin once a week for 28 days.  Dispense: 3 mL; Refill: 0 -     CT CARDIAC SCORING (SELF PAY ONLY); Future

## 2023-02-17 NOTE — Assessment & Plan Note (Signed)
Trying to ramp Wegovy, rosuvastatin Taking eliquis due to atrial fibrillation. Reviewed risk and will get CT coronary calcium to help get [facility] and confirm coronary artery disease due to FDA approved cardiac indication for [facility].

## 2023-04-11 ENCOUNTER — Telehealth (INDEPENDENT_AMBULATORY_CARE_PROVIDER_SITE_OTHER): Payer: PPO | Admitting: Family Medicine

## 2023-04-11 DIAGNOSIS — U071 COVID-19: Secondary | ICD-10-CM

## 2023-04-11 MED ORDER — MOLNUPIRAVIR EUA 200MG CAPSULE: 4.0000 | ORAL_CAPSULE | Freq: Two times a day (BID) | ORAL | 0 refills | Status: AC

## 2023-04-11 NOTE — Progress Notes (Signed)
Virtual Visit via Telephone Note  I connected with [name] on 04/11/23 at  5:00 PM EDT by telephone and verified that I am speaking with the correct person using two identifiers.   I discussed the limitations of performing an evaluation and management service by telephone and requested permission for a phone visit. The patient expressed understanding and agreed to proceed.  Location patient:  [place] Location provider: work or home office Participants present for the call: patient, provider Patient did not have a visit with me in the prior 7 days to address this/these issue(s).   History of Present Illness:  Acute telemedicine visit for : -Onset: 2 days ago, tested positive yesterday -Symptoms include: headache, tired, nasal congestion, a little diarrhea -Denies: fevers, CP, SOB, NV -Pertinent past medical history: see below, has had covid 4 times, last times was last year -Pertinent medication allergies:  Allergies  Allergen Reactions   Codeine Nausea And Vomiting   Misc. 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   Chronic anticoagulation 01/24/2019   DDD (degenerative disc disease), cervical 03/23/2018   Fecal occult blood test positive 12/06/2018   Heart murmur    --innocent   Hypertension    Hypertensive heart disease without heart failure 12/10/2018   Leg cramps 12/09/2022   Notes when she takes fluid and potassium.Marland Kitchen doesn't take magnesium   Morbid obesity (HCC) 06/10/2022   PAF (paroxysmal atrial fibrillation) (HCC) 12/10/2018   SVT  (supraventricular tachycardia) 04/24/2019    Current Outpatient Medications on File Prior to Visit  Medication Sig Dispense Refill   albuterol (PROVENTIL) (2.5 MG/3ML) 0.083% nebulizer solution Take 3 mLs (2.5 mg total) by nebulization every 6 (six) hours as needed for wheezing or shortness of breath. 75 mL 12   albuterol (VENTOLIN HFA) 108 (90 Base) MCG/ACT inhaler ProAir HFA 90 mcg/actuation aerosol inhaler  INHALE 1 TO 2 PUFFS EVERY 4 TO 6 HOURS AS NEEDED 30 DAYS (Patient taking differently: Inhale 1-2 puffs into the lungs every 6 (six) hours as needed for wheezing or shortness of breath. ProAir HFA 90 mcg/actuation aerosol inhaler  INHALE 1 TO 2 PUFFS EVERY 4 TO 6 HOURS AS NEEDED 30 DAYS) 2 each 11   apixaban (ELIQUIS) 5 MG TABS tablet Take 1 tablet (5 mg total) by mouth 2 (two) times daily. 180 tablet 3   furosemide (LASIX) 20 MG tablet Take 1 tablet (20 mg total) by mouth 3 (three) times a week. As needed for fluid 45 tablet 3   losartan-hydrochlorothiazide (HYZAAR) 50-12.5 MG tablet Take 1 tablet by mouth daily. 90 tablet 3   mupirocin ointment (BACTROBAN) 2 % Apply topically.     ondansetron (ZOFRAN-ODT) 4 MG disintegrating tablet Take 1 tablet (4 mg total) by mouth every 8 (eight) hours as needed for nausea or vomiting (for nausea from wegovy or other source). 20 tablet 0   pantoprazole (PROTONIX) 40 MG tablet Take 1 tablet (40 mg total) by mouth daily as needed (acid reflux). 90 tablet 3   potassium chloride (KLOR-CON) 10 MEQ tablet Take 1 tablet (10 mEq total) by mouth 3 (three) times a week. 45 tablet 3  rosuvastatin (CRESTOR) 5 MG tablet Take 1 tablet (5 mg total) by mouth 3 (three) times a week. 12 tablet 3   Semaglutide-Weight Management 0.5 MG/0.5ML SOAJ Inject 0.5 mg into the skin once a week for 28 days. 2 mL 0   [START ON 04/15/2023] Semaglutide-Weight Management 1 MG/0.5ML SOAJ Inject 1 mg into the skin once a week for 28 days. 2 mL 0   [START ON 05/14/2023] Semaglutide-Weight  Management 1.7 MG/0.75ML SOAJ Inject 1.7 mg into the skin once a week for 28 days. 3 mL 0   [START ON 06/12/2023] Semaglutide-Weight Management 2.4 MG/0.75ML SOAJ Inject 2.4 mg into the skin once a week for 28 days. 3 mL 0   terbinafine (LAMISIL) 250 MG tablet Take 1 tablet (250 mg total) by mouth daily. For toenail fungal infection 84 tablet 0   topiramate (TOPAMAX) 25 MG tablet Take 1 tablet (25 mg total) by mouth 2 (two) times daily. To start: just 1 tablet daily for a week 180 tablet 3   verapamil (CALAN) 80 MG tablet Take 1 tablet (80 mg total) by mouth 2 (two) times daily. 180 tablet 2   No current facility-administered medications on file prior to visit.    Observations/Objective: Patient sounds cheerful and well on the phone. I do not appreciate any SOB. Speech and thought processing are grossly intact. Patient reported vitals:  Assessment and Plan:  COVID-19  -we discussed treatment, treatment risks, potential complications, isolation and precautions. Pt prefers to treat via telemedicine empirically rather than in person at this moment. She opted for [facility] after discussion of options.  Work/School slipped offered: provided in patient instructions   Advised to seek prompt  in person care if worsening, new symptoms arise, or if is not improving with treatment as expected per our conversation of expected course. Discussed options for follow up care. Did let this patient know that I do telemedicine on Tuesdays and Thursdays for [facility] and those are the days I am logged into the system. Advised to schedule follow up visit with PCP, [facility] virtual visits or [facility] if any further questions or concerns to avoid delays in care.   I discussed the assessment and treatment plan with the patient. The patient was provided an opportunity to ask questions and all were answered. The patient agreed with the plan and demonstrated an understanding of the instructions.    Follow Up  Instructions:  I did not refer this patient for an OV with me in the next 24 hours for this/these issue(s).  I discussed the assessment and treatment plan with the patient. The patient was provided an opportunity to ask questions and all were answered. The patient agreed with the plan and demonstrated an understanding of the instructions.   I spent 15 minutes on the date of this visit in the care of this patient. See summary of tasks completed to properly care for this patient in the detailed notes above which also included counseling of above, review of PMH, medications, allergies, evaluation of the patient and ordering and/or  instructing patient on testing and care options.     Terressa Koyanagi, DO

## 2023-04-11 NOTE — Patient Instructions (Signed)
---------------------------------------------------------------------------------------------------------------------------    WORK SLIP:  Patient [name],  1945/12/17, was seen for a medical visit today, 04/11/23 . Please excuse from work for a COVID/flu like illness. Patient will likely be contagious for an average of 7-10 days from the onset of symptoms, PLUS 1 day of no fever and improved symptoms. Other viruses tend to be contagious for 5-10 days on average. Will defer to employer for a sooner return to work if patient is afebrile for 24 hours, is feeling better, and can wear a high-quality, tight fitting mask (N95 or KN95 is best) at all times for 10 days from the onset of symptoms.   Sincerely: E-signature: Dr. Kriste Basque, DO [facility] Ph: [phone]   ------------------------------------------------------------------------------------------------------------------------------    HOME CARE TIPS:   -I sent the medication(s) we discussed to your pharmacy: Meds ordered this encounter  Medications   molnupiravir EUA (LAGEVRIO) 200 mg CAPS capsule    Sig: Take 4 capsules (800 mg total) by mouth 2 (two) times daily for 5 days.    Dispense:  40 capsule    Refill:  0     -I sent in the Covid19 treatment or referral you requested per our discussion. Please see the information provided below and discuss further with the pharmacist/treatment team.   -there is a chance of rebound illness with covid after improving. This can happen whether or not you take an antiviral treatment. If you become sick again with covid after getting better, please schedule a follow up virtual visit and isolate again.  -can use tylenol if needed for fevers, aches and pains per instructions  -nasal saline sinus rinses twice daily  -stay hydrated, drink plenty of fluids and eat small healthy meals - avoid dairy  -can take 1000 IU ( ) Vit D3 and 100-500 mg of Vit  C daily per instructions  -follow up with your doctor in 2-3 days unless improving and feeling better  -stay home while sick, except to seek medical care. If you have COVID19, you will likely be contagious for 7-10 days. Flu or Influenza is likely contagious for about 7 days. Other respiratory viral infections remain contagious for 5-10+ days depending on the virus and many other factors. Wear a good mask that fits snugly (such as N95 or KN95) if around others to reduce the risk of transmission.  It was nice to meet you today, and I really hope you are feeling better soon. I help  out with telemedicine visits on Tuesdays and Thursdays and am happy to help if you need a follow up virtual visit on those days. Otherwise, if you have any concerns or questions following this visit please schedule a follow up visit with your Primary Care doctor or seek care at a local urgent care clinic to avoid delays in care.    Seek in person care or schedule a follow up video visit promptly if your symptoms worsen, new concerns arise or you are not improving with treatment. Call 911 and/or seek emergency care if your symptoms are severe or life threatening.  PLEASE SEE THE FOLLOWING LINK FOR THE MOST UPDATED INFORMATION ABOUT LAGEVRIO:  [email or web address]      Fact Sheet for Patients And Caregivers Emergency Use Authorization (EUA) Of LAGEVRIOT (molnupiravir) capsules For Coronavirus Disease 2019 (COVID-19)  What is the most important information I should know about LAGEVRIO? LAGEVRIO may cause serious side effects, including: ? LAGEVRIO may cause harm to your unborn baby. It is not  known if LAGEVRIO will harm your baby if you take LAGEVRIO during pregnancy. o LAGEVRIO is not recommended for use in pregnancy. o LAGEVRIO has not been studied in pregnancy. LAGEVRIO was studied in pregnant animals only. When LAGEVRIO was given to pregnant animals, LAGEVRIO caused harm to their unborn  babies. o You and your healthcare provider may decide that you should take LAGEVRIO during pregnancy if there are no other COVID-19 treatment options approved or authorized by the FDA that are accessible or clinically appropriate for you. o If you and your healthcare provider decide that you should take LAGEVRIO during pregnancy, you and your healthcare provider should discuss the known and potential benefits and the potential risks of taking LAGEVRIO during pregnancy. For individuals who are able to become pregnant: ? You should use a reliable method of birth control (contraception) consistently and correctly during treatment with LAGEVRIO and for 4 days after the last dose of LAGEVRIO. Talk to your healthcare provider about reliable birth control methods. ? Before starting treatment with [facility] your healthcare provider may do a pregnancy test to see if you are pregnant before starting treatment with LAGEVRIO. ? Tell your healthcare provider right away if you become pregnant or think you may be pregnant during treatment with LAGEVRIO. Pregnancy Surveillance Program: ? There is a pregnancy surveillance program for individuals who take LAGEVRIO during pregnancy. The purpose of this program is to collect information about the health of you and your baby. Talk to your healthcare provider about how to take part in this program. ? If you take LAGEVRIO during pregnancy and you agree to participate in the pregnancy surveillance program and allow your healthcare provider to share your information with [employer], then your healthcare provider will report your use of LAGEVRIO during pregnancy to [employer] by calling [phone] or RelayImage.ca. For individuals who are sexually active with partners who are able to become pregnant: ? It is not known if LAGEVRIO can affect sperm. While the risk is regarded as low, animal studies to fully assess the potential  for LAGEVRIO to affect the babies of males treated with LAGEVRIO have not been completed. A reliable method of birth control (contraception) should be used consistently and correctly during treatment with LAGEVRIO and for at least 3 months after the last dose. The risk to sperm beyond 3 months is not known. Studies to understand the risk to sperm beyond 3 months are ongoing. Talk to your healthcare provider about reliable birth control methods. Talk to your healthcare provider if you have questions or concerns about how LAGEVRIO may affect sperm. You are being given this fact sheet because your healthcare provider believes it is necessary to provide you with LAGEVRIO for the treatment of adults with mild-to-moderate coronavirus disease 2019 (COVID-19) with positive results of direct SARS-CoV-2 viral testing, and who are at high risk for progression to severe COVID-19 including hospitalization or death, and for whom other COVID-19 treatment options approved or authorized by the FDA are not accessible or clinically appropriate. The U.S. Food and Drug Administration (FDA) has issued an Emergency Use Authorization (EUA) to make LAGEVRIO available during the COVID-19 pandemic (for more details about an EUA please see "What is an Emergency Use Authorization?" at the end of this document). LAGEVRIO is not an FDA-approved medicine in the [place]. Read this Fact Sheet for information about LAGEVRIO. Talk to your healthcare provider about your options if you have any questions. It is your choice to take  LAGEVRIO.  What is COVID-19? COVID-19 is caused by a virus called a coronavirus. You can get COVID-19 through close contact with another person who has the virus. COVID-19 illnesses have ranged from very mild-to-severe, including illness resulting in death. While information so far suggests that most COVID-19 illness is mild, serious illness can happen and may cause some of your other medical  conditions to become worse. Older people and people of all ages with severe, long lasting (chronic) medical conditions like heart disease, lung disease and diabetes, for example seem to be at higher risk of being hospitalized for COVID-19.  What is LAGEVRIO? LAGEVRIO is an investigational medicine used to treat mild-to-moderate COVID-19 in adults: ? with positive results of direct SARS-CoV-2 viral testing, and ? who are at high risk for progression to severe COVID-19 including hospitalization or death, and for whom other COVID-19 treatment options approved or authorized by the FDA are not accessible or clinically appropriate. The FDA has authorized the emergency use of LAGEVRIO for the treatment of mild-tomoderate COVID-19 in adults under an EUA. For more information on EUA, see the "What is an Emergency Use Authorization (EUA)?" section at the end of this Fact Sheet. LAGEVRIO is not authorized: ? for use in people less than 33 years of age. ? for prevention of COVID-19. ? for people needing hospitalization for COVID-19. ? for use for longer than 5 consecutive days.  What should I tell my healthcare provider before I take LAGEVRIO? Tell your healthcare provider if you: ? Have any allergies ? Are breastfeeding or plan to breastfeed ? Have any serious illnesses ? Are taking any medicines (prescription, over-the-counter, vitamins, or herbal products).  How do I take LAGEVRIO? ? Take LAGEVRIO exactly as your healthcare provider tells you to take it. ? Take 4 capsules of LAGEVRIO every 12 hours (for example, at 8 am and at 8 pm) ? Take LAGEVRIO for 5 days. It is important that you complete the full 5 days of treatment with LAGEVRIO. Do not stop taking LAGEVRIO before you complete the full 5 days of treatment, even if you feel better. ? Take LAGEVRIO with or without food. ? You should stay in isolation for as long as your healthcare provider tells you to. Talk to your healthcare provider  if you are not sure about how to properly isolate while you have COVID-19. ? Swallow LAGEVRIO capsules whole. Do not open, break, or crush the capsules. If you cannot swallow capsules whole, tell your healthcare provider. ? What to do if you miss a dose: o If it has been less than 10 hours since the missed dose, take it as soon as you remember o If it has been more than 10 hours since the missed dose, skip the missed dose and take your dose at the next scheduled time. ? Do not double the dose of LAGEVRIO to make up for a missed dose.  What are the important possible side effects of LAGEVRIO? ? See, "What is the most important information I should know about LAGEVRIO?" ? Allergic Reactions. Allergic reactions can happen in people taking LAGEVRIO, even after only 1 dose. Stop taking LAGEVRIO and call your healthcare provider right away if you get any of the following symptoms of an allergic reaction: o hives o rapid heartbeat o trouble swallowing or breathing o swelling of the mouth, lips, or face o throat tightness o hoarseness o skin rash The most common side effects of LAGEVRIO are: ? diarrhea ? nausea ? dizziness These are  not all the possible side effects of LAGEVRIO. Not many people have taken LAGEVRIO. Serious and unexpected side effects may happen. This medicine is still being studied, so it is possible that all of the risks are not known at this time.  What other treatment choices are there?  Veklury (remdesivir) is FDA-approved as an intravenous (IV) infusion for the treatment of mildto-moderate COVID-19 in certain adults and children. Talk with your doctor to see if Elias Else is appropriate for you. Like LAGEVRIO, FDA may also allow for the emergency use of other medicines to treat people with COVID-19. Go to http://www.austin-beck.biz/ for more information. It is your choice to  be treated or not to be treated with LAGEVRIO. Should you decide not to take it, it will not change your standard medical care.  What if I am breastfeeding? Breastfeeding is not recommended during treatment with LAGEVRIO and for 4 days after the last dose of LAGEVRIO. If you are breastfeeding or plan to breastfeed, talk to your healthcare provider about your options and specific situation before taking LAGEVRIO.  How do I report side effects with LAGEVRIO? Contact your healthcare provider if you have any side effects that bother you or do not go away. Report side effects to FDA MedWatch at MacRetreat.be or call 1-800-FDA-1088 ([phone]).  How should I store LAGEVRIO? ? Store LAGEVRIO capsules at room temperature between 18F to 10F (20C to 25C). ? Keep LAGEVRIO and all medicines out of the reach of children and pets. How can I learn more about COVID-19? ? Ask your healthcare provider. ? Visit IndexCrawler.co.za ? Contact your local or state public health department. ? Call [employer] at [phone] (toll free in the [place] ? Visit www.molnupiravir.com  What Is an Emergency Use Authorization (EUA)? The [place] FDA has made LAGEVRIO available under an emergency access mechanism called an Emergency Use Authorization (EUA) The EUA is supported by a Insurance account manager Health and Human Service (HHS) declaration that circumstances exist to justify emergency use of drugs and biological products during the COVID-19 pandemic. LAGEVRIO for the treatment of mild-to-moderate COVID-19 in adults with positive results of direct SARS-CoV-2 viral testing, who are at high risk for progression to severe COVID-19, including hospitalization or death, and for whom alternative COVID-19 treatment options approved or authorized by FDA are not accessible or clinically appropriate, has not undergone the same type of review as an FDA-approved product. In issuing an EUA under the  COVID-19 public health emergency, the FDA has determined, among other things, that based on the total amount of scientific evidence available including data from adequate and well-controlled clinical trials, if available, it is reasonable to believe that the product may be effective for diagnosing, treating, or preventing COVID-19, or a serious or life-threatening disease or condition caused by COVID-19; that the known and potential benefits of the product, when used to diagnose, treat, or prevent such disease or condition, outweigh the known and potential risks of such product; and that there are no adequate, approved, and available alternatives.  All of these criteria must be met to allow for the product to be used in the treatment of patients during the COVID-19 pandemic. The EUA for LAGEVRIO is in effect for the duration of the COVID-19 declaration justifying emergency use of LAGEVRIO, unless terminated or revoked (after which LAGEVRIO may no longer be used under the EUA). For patent information: LeaseGuru.tn Copyright  2021-2022 [employer] [place], [place] and its affiliates. All rights reserved. [id number] Revised:  March 2022

## 2023-04-26 ENCOUNTER — Other Ambulatory Visit: Payer: Self-pay | Admitting: Internal Medicine

## 2023-04-26 DIAGNOSIS — I4891 Unspecified atrial fibrillation: Secondary | ICD-10-CM

## 2023-05-01 ENCOUNTER — Encounter: Payer: PPO | Admitting: Pharmacist

## 2023-05-18 ENCOUNTER — Ambulatory Visit: Payer: PPO

## 2023-05-18 ENCOUNTER — Ambulatory Visit (INDEPENDENT_AMBULATORY_CARE_PROVIDER_SITE_OTHER): Payer: PPO

## 2023-05-18 VITALS — BP 130/72 | HR 70 | Temp 97.8°F | Resp 95 | Wt 237.8 lb

## 2023-05-18 DIAGNOSIS — Z23 Encounter for immunization: Secondary | ICD-10-CM | POA: Diagnosis not present

## 2023-05-18 DIAGNOSIS — Z0000 Encounter for general adult medical examination without abnormal findings: Secondary | ICD-10-CM | POA: Diagnosis not present

## 2023-05-18 NOTE — Progress Notes (Addendum)
Subjective:   [name] is a 77 y.o. female who presents for Medicare Annual (Subsequent) preventive examination.  Visit Complete: In person    Review of Systems     Cardiac Risk Factors include: advanced age (>1men, >56 women);hypertension;obesity (BMI >30kg/m2)     Objective:    Today's Vitals   05/18/23 1017  BP: 130/72  Pulse: 70  Resp: (!) 95  Temp: 97.8 F (36.6 C)  Weight: 237 lb 12.8 oz (107.9 kg)   Body mass index is 42.12 kg/m.     05/18/2023   10:36 AM 08/02/2022    2:32 PM  Advanced Directives  Does Patient Have a Medical Advance Directive? Yes Yes  Type of Estate agent of [place];Living will Healthcare Power of [place];Living will  Copy of Healthcare Power of Attorney in Chart? No - copy requested No - copy requested    Current Medications (verified) Outpatient Encounter Medications as of 05/18/2023  Medication Sig   albuterol (PROVENTIL) (2.5 MG/3ML) 0.083% nebulizer solution Take 3 mLs (2.5 mg total) by nebulization every 6 (six) hours as needed for wheezing or shortness of breath.   albuterol (VENTOLIN HFA) 108 (90 Base) MCG/ACT inhaler ProAir HFA 90 mcg/actuation aerosol inhaler  INHALE 1 TO 2 PUFFS EVERY 4 TO 6 HOURS AS NEEDED 30 DAYS (Patient taking differently: Inhale 1-2 puffs into the lungs every 6 (six) hours as needed for wheezing or shortness of breath. ProAir HFA 90 mcg/actuation aerosol inhaler  INHALE 1 TO 2 PUFFS EVERY 4 TO 6 HOURS AS NEEDED 30 DAYS)   apixaban (ELIQUIS) 5 MG TABS tablet Take 1 tablet (5 mg total) by mouth 2 (two) times daily.   furosemide (LASIX) 20 MG tablet Take 1 tablet (20 mg total) by mouth 3 (three) times a week. As needed for fluid   losartan-hydrochlorothiazide (HYZAAR) 50-12.5 MG tablet Take 1 tablet by mouth daily.   mupirocin ointment (BACTROBAN) 2 % Apply topically.   ondansetron (ZOFRAN-ODT) 4 MG disintegrating tablet Take 1 tablet (4 mg total) by mouth every 8 (eight) hours as  needed for nausea or vomiting (for nausea from wegovy or other source).   pantoprazole (PROTONIX) 40 MG tablet Take 1 tablet (40 mg total) by mouth daily as needed (acid reflux).   rosuvastatin (CRESTOR) 5 MG tablet Take 1 tablet (5 mg total) by mouth 3 (three) times a week.   verapamil (CALAN) 80 MG tablet TAKE 1 TABLET BY MOUTH 2 TIMES DAILY   Semaglutide-Weight Management 1.7 MG/0.75ML SOAJ Inject 1.7 mg into the skin once a week for 28 days. (Patient not taking: Reported on 05/18/2023)   [START ON 06/12/2023] Semaglutide-Weight Management 2.4 MG/0.75ML SOAJ Inject 2.4 mg into the skin once a week for 28 days. (Patient not taking: Reported on 05/18/2023)   terbinafine (LAMISIL) 250 MG tablet Take 1 tablet (250 mg total) by mouth daily. For toenail fungal infection   [DISCONTINUED] potassium chloride (KLOR-CON) 10 MEQ tablet Take 1 tablet (10 mEq total) by mouth 3 (three) times a week.   [DISCONTINUED] topiramate (TOPAMAX) 25 MG tablet Take 1 tablet (25 mg total) by mouth 2 (two) times daily. To start: just 1 tablet daily for a week   No facility-administered encounter medications on file as of 05/18/2023.    Allergies (verified) Codeine, Misc. 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   Leg cramps 12/09/2022   Notes when she takes fluid and potassium.Marland Kitchen doesn't take magnesium   Morbid obesity (HCC) 06/10/2022   PAF (paroxysmal atrial fibrillation) (HCC) 12/10/2018   SVT (supraventricular tachycardia) 04/24/2019   Past Surgical History:  Procedure Laterality Date   ABDOMINAL  HYSTERECTOMY  03/2010   TAH/BSO   CESAREAN SECTION  1981   ENDOMETRIAL BIOPSY     05-26-09--benign, 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   ROTATOR CUFF REPAIR Left 2010   TOTAL KNEE ARTHROPLASTY Bilateral    Family History  Problem Relation Age of Onset   Breast cancer Mother    Heart attack Maternal Grandfather    Breast cancer Paternal Aunt    Stroke Paternal Aunt    Breast cancer Paternal Aunt    Stroke Other    Depression Daughter    ADD / ADHD Daughter    Depression Daughter    ADD / ADHD Daughter    Depression Daughter    Social History   Socioeconomic History   Marital status: Married    Spouse name: Not on file   Number of children: Not on file   Years of education: Not on file   Highest education level: Not on file  Occupational History   Not on file  Tobacco Use   Smoking status: Never   Smokeless tobacco: Never  Vaping Use   Vaping status: Never Used  Substance and Sexual Activity   Alcohol use: Yes    Comment: occ wine   Drug use: Never   Sexual activity: Never    Partners: Male    Birth control/protection: Surgical    Comment: TAH/BSO  Other Topics Concern   Not on file  Social History Narrative   Not on file   Social Determinants of Health   Financial Resource Strain: Low Risk  (05/18/2023)   Overall Financial Resource Strain (CARDIA)    Difficulty of Paying Living Expenses: Not hard at all  Food Insecurity: No Food Insecurity (05/18/2023)   Hunger Vital Sign    Worried About Running Out of Food in the Last Year: Never true    Ran Out of Food in the Last Year: Never true  Transportation Needs: No Transportation Needs (05/18/2023)   PRAPARE - Administrator, Civil Service (Medical): No    Lack of Transportation (Non-Medical): No  Physical Activity: Inactive (05/18/2023)   Exercise Vital Sign    Days of Exercise per Week: 0 days    Minutes of Exercise per Session: 0 min  Stress: No Stress Concern Present (05/18/2023)   [employer] of Occupational Health - Occupational Stress Questionnaire    Feeling of Stress : Only a little  Social Connections: Moderately Isolated (05/18/2023)   Social Connection and Isolation Panel [NHANES]    Frequency of Communication with Friends and Family: More than three times a week    Frequency of Social Gatherings with Friends and Family: More than three times a week    Attends Religious Services: Never    Database administrator or Organizations: No    Attends Banker Meetings: Never    Marital Status: Married    Tobacco Counseling Counseling given: Not Answered   Clinical Intake:  Pre-visit preparation completed: Yes  Pain :  No/denies pain     BMI - recorded: 42.12 Nutritional Status: BMI > 30  Obese Nutritional Risks: None Diabetes: No  How often do you need to have someone help you when you read instructions, pamphlets, or other written materials from your doctor or pharmacy?: 1 - Never  Interpreter Needed?: No  Information entered by :: Lanier Ensign, LPN   Activities of Daily Living    05/18/2023   10:27 AM 08/02/2022    2:36 PM  In your present state of health, do you have any difficulty performing the following activities:  Hearing? 1 0  Comment tinnitus   Vision? 0 0  Difficulty concentrating or making decisions? 0 0  Walking or climbing stairs? 0 0  Dressing or bathing? 0 0  Doing errands, shopping? 0 0  Preparing Food and eating ? N N  Using the Toilet? N N  In the past six months, have you accidently leaked urine? N N  Do you have problems with loss of bowel control? N N  Managing your Medications? N N  Managing your Finances? N N  Housekeeping or managing your Housekeeping? N N    Patient Care Team: Lula Olszewski, MD as PCP - General (Internal Medicine) Dulce Sellar Iline Oven, MD as PCP - Cardiology (Cardiology) Kathi Der, MD as Consulting Physician (Gastroenterology) Specialists, Dermatology  (Dermatology)  Indicate any recent Medical Services you may have received from other than [facility] providers in the past year (date may be approximate).     Assessment:   This is a routine wellness examination for Celeste.  Hearing/Vision screen Hearing Screening - Comments:: Tinnitus with slight hearing loss  Vision Screening - Comments:: Pt follows up with Dr Randon Goldsmith for annual eye exams    Goals Addressed             This Visit's Progress    Patient Stated       Lose weight an feel better        Depression Screen    05/18/2023   10:31 AM 12/09/2022    8:11 AM 08/02/2022    2:33 PM  PHQ 2/9 Scores  PHQ - 2 Score 0 0 0  PHQ- 9 Score  3     Fall Risk    05/18/2023   10:37 AM 02/16/2023    2:35 PM 12/09/2022    8:11 AM 08/02/2022    2:33 PM  Fall Risk   Falls in the past year? 1 0 0 0  Number falls in past yr: 1 0 0 0  Comment rolled off     Injury with Fall? 0 0 0 0  Risk for fall due to : Impaired vision No Fall Risks No Fall Risks Impaired vision  Follow up Falls prevention discussed Falls evaluation completed Falls evaluation completed Falls prevention discussed    MEDICARE RISK AT HOME: Medicare Risk at Home Any stairs in or around the home?: Yes If so, are there any without handrails?: No Home free of loose throw rugs in walkways, pet beds, electrical cords, etc?: Yes Adequate lighting in your home to reduce risk of falls?: Yes Life alert?: No Use of a cane, walker or w/c?: No Grab bars in the bathroom?: No Shower chair or bench in shower?: No Elevated toilet seat or a handicapped toilet?: No  TIMED UP AND GO:  Was the test performed?  Yes  Length of time to ambulate 10 feet: 10 sec Gait steady and fast without use of assistive device  Cognitive Function:        05/18/2023   10:39 AM 08/02/2022    2:37 PM  6CIT Screen  What Year? 0 points 0 points  What month? 0 points 0 points  What time? 0 points 0 points  Count back from 20 0 points 0 points   Months in reverse 0 points 0 points  Repeat phrase 2 points 0 points  Total Score 2 points 0 points    Immunizations Immunization History  Administered Date(s) Administered   Fluad Trivalent(High Dose 65+) 05/18/2023   Influenza-Unspecified 05/06/2014   Unspecified SARS-COV-2 Vaccination 09/19/2019, 10/10/2019   Zoster Recombinant(Shingrix) 01/23/2019, 04/26/2019    TDAP status: Due, Education has been provided regarding the importance of this vaccine. Advised may receive this vaccine at local pharmacy or Health Dept. Aware to provide a copy of the vaccination record if obtained from local pharmacy or Health Dept. Verbalized acceptance and understanding.  Flu Vaccine status: Completed at today's visit  Pneumococcal vaccine status: Due, Education has been provided regarding the importance of this vaccine. Advised may receive this vaccine at local pharmacy or Health Dept. Aware to provide a copy of the vaccination record if obtained from local pharmacy or Health Dept. Verbalized acceptance and understanding.  Covid-19 vaccine status: Declined, Education has been provided regarding the importance of this vaccine but patient still declined. Advised may receive this vaccine at local pharmacy or Health Dept.or vaccine clinic. Aware to provide a copy of the vaccination record if obtained from local pharmacy or Health Dept. Verbalized acceptance and understanding.  Qualifies for Shingles Vaccine? Yes   Zostavax completed Yes   Shingrix Completed?: Yes  Screening Tests Health Maintenance  Topic Date Due   DTaP/Tdap/Td (1 - Tdap) Never done   Pneumonia Vaccine 63+ Years old (1 of 1 - PCV) Never done   DEXA SCAN  08/03/2023 (Originally 12/01/2012)   Medicare Annual Wellness (AWV)  05/17/2024   Colonoscopy  11/09/2031   INFLUENZA VACCINE  Completed   Zoster Vaccines- Shingrix  Completed   HPV VACCINES  Aged Out   COVID-19 Vaccine  Discontinued   Hepatitis C Screening  Discontinued     Health Maintenance  Health Maintenance Due  Topic Date Due   DTaP/Tdap/Td (1 - Tdap) Never done   Pneumonia Vaccine 58+ Years old (1 of 1 - PCV) Never done    Colorectal cancer screening: Type of screening: Colonoscopy. Completed 11/08/21. Repeat every 10 years  Mammogram status: Completed 02/03/23. Repeat every year      Additional Screening:  Hepatitis C Screening:   Vision Screening: Recommended annual ophthalmology exams for early detection of glaucoma and other disorders of the eye. Is the patient up to date with their annual eye exam?  Yes  Who is the provider or what is the name of the office in which the patient attends annual eye exams? Dr Randon Goldsmith If pt is not established with a provider, would they like to be referred to a provider to establish care? No .   Dental Screening: Recommended annual dental exams for proper oral hygiene    Community Resource Referral / Chronic Care Management: CRR required this visit?  No   CCM required this visit?  No     Plan:     I have personally reviewed and noted the following in the patient's chart:   Medical and social history Use of alcohol, tobacco or illicit drugs  Current medications and supplements including opioid prescriptions. Patient is not currently taking opioid prescriptions.  Functional ability and status Nutritional status Physical activity Advanced directives List of other physicians Hospitalizations, surgeries, and ER visits in previous 12 months Vitals Screenings to include cognitive, depression, and falls Referrals and appointments  In addition, I have reviewed and discussed with patient certain preventive protocols, quality metrics, and best practice recommendations. A written personalized care plan for preventive services as well as general preventive health recommendations were provided to patient.     Marzella Schlein, LPN   [id number]/61/0960   After Visit Summary: (MyChart) Due to this being a  telephonic visit, the after visit summary with patients personalized plan was offered to patient via MyChart   Nurse Notes: none

## 2023-05-18 NOTE — Patient Instructions (Signed)
Ms. Kingry , Thank you for taking time to come for your Medicare Wellness Visit. I appreciate your ongoing commitment to your health goals. Please review the following plan we discussed and let me know if I can assist you in the future.   Referrals/Orders/Follow-Ups/Clinician Recommendations: lose some weight and get more energy   This is a list of the screening recommended for you and due dates:  Health Maintenance  Topic Date Due   DTaP/Tdap/Td vaccine (1 - Tdap) Never done   Pneumonia Vaccine (1 of 1 - PCV) Never done   Flu Shot  04/06/2023   DEXA scan (bone density measurement)  08/03/2023*   Medicare Annual Wellness Visit  05/17/2024   Colon Cancer Screening  11/09/2031   Zoster (Shingles) Vaccine  Completed   HPV Vaccine  Aged Out   COVID-19 Vaccine  Discontinued   Hepatitis C Screening  Discontinued  *Topic was postponed. The date shown is not the original due date.    Advanced directives: (Provided) Advance directive discussed with you today. I have provided a copy for you to complete at home and have notarized. Once this is complete, please bring a copy in to our office so we can scan it into your chart.   Next Medicare Annual Wellness Visit scheduled for next year: Yes

## 2023-05-19 ENCOUNTER — Ambulatory Visit: Payer: PPO | Admitting: Cardiology

## 2023-05-25 ENCOUNTER — Other Ambulatory Visit: Payer: Self-pay

## 2023-05-25 DIAGNOSIS — R062 Wheezing: Secondary | ICD-10-CM

## 2023-05-25 MED ORDER — ALBUTEROL SULFATE HFA 108 (90 BASE) MCG/ACT IN AERS: INHALATION_SPRAY | RESPIRATORY_TRACT | 11 refills | Status: DC

## 2023-06-16 ENCOUNTER — Ambulatory Visit (INDEPENDENT_AMBULATORY_CARE_PROVIDER_SITE_OTHER): Payer: PPO | Admitting: Internal Medicine

## 2023-06-16 ENCOUNTER — Encounter: Payer: Self-pay | Admitting: Internal Medicine

## 2023-06-16 VITALS — BP 135/65 | HR 60 | Temp 97.8°F | Ht 63.0 in | Wt 235.4 lb

## 2023-06-16 DIAGNOSIS — K649 Unspecified hemorrhoids: Secondary | ICD-10-CM | POA: Diagnosis not present

## 2023-06-16 DIAGNOSIS — E559 Vitamin D deficiency, unspecified: Secondary | ICD-10-CM

## 2023-06-16 DIAGNOSIS — R079 Chest pain, unspecified: Secondary | ICD-10-CM | POA: Diagnosis not present

## 2023-06-16 DIAGNOSIS — I480 Paroxysmal atrial fibrillation: Secondary | ICD-10-CM

## 2023-06-16 DIAGNOSIS — Z7901 Long term (current) use of anticoagulants: Secondary | ICD-10-CM | POA: Diagnosis not present

## 2023-06-16 DIAGNOSIS — M5030 Other cervical disc degeneration, unspecified cervical region: Secondary | ICD-10-CM

## 2023-06-16 DIAGNOSIS — R252 Cramp and spasm: Secondary | ICD-10-CM

## 2023-06-16 DIAGNOSIS — R195 Other fecal abnormalities: Secondary | ICD-10-CM

## 2023-06-16 DIAGNOSIS — M25511 Pain in right shoulder: Secondary | ICD-10-CM

## 2023-06-16 DIAGNOSIS — B351 Tinea unguium: Secondary | ICD-10-CM

## 2023-06-16 DIAGNOSIS — R011 Cardiac murmur, unspecified: Secondary | ICD-10-CM

## 2023-06-16 DIAGNOSIS — Z9189 Other specified personal risk factors, not elsewhere classified: Secondary | ICD-10-CM

## 2023-06-16 DIAGNOSIS — J4520 Mild intermittent asthma, uncomplicated: Secondary | ICD-10-CM | POA: Diagnosis not present

## 2023-06-16 DIAGNOSIS — I10 Essential (primary) hypertension: Secondary | ICD-10-CM | POA: Diagnosis not present

## 2023-06-16 DIAGNOSIS — R638 Other symptoms and signs concerning food and fluid intake: Secondary | ICD-10-CM | POA: Diagnosis not present

## 2023-06-16 DIAGNOSIS — G8929 Other chronic pain: Secondary | ICD-10-CM | POA: Diagnosis not present

## 2023-06-16 DIAGNOSIS — Z860100 Personal history of colon polyps, unspecified: Secondary | ICD-10-CM

## 2023-06-16 DIAGNOSIS — F419 Anxiety disorder, unspecified: Secondary | ICD-10-CM | POA: Diagnosis not present

## 2023-06-16 DIAGNOSIS — Z789 Other specified health status: Secondary | ICD-10-CM

## 2023-06-16 DIAGNOSIS — M5412 Radiculopathy, cervical region: Secondary | ICD-10-CM

## 2023-06-16 MED ORDER — REPATHA SURECLICK 140 MG/ML ~~LOC~~ SOAJ
140.0000 mg | SUBCUTANEOUS | 2 refills | Status: DC
Start: 2023-06-16 — End: 2024-02-09

## 2023-06-16 MED ORDER — SEMAGLUTIDE-WEIGHT MANAGEMENT 1 MG/0.5ML ~~LOC~~ SOAJ
1.0000 mg | SUBCUTANEOUS | 0 refills | Status: AC
Start: 2023-08-13 — End: 2023-09-10

## 2023-06-16 MED ORDER — ROSUVASTATIN CALCIUM 5 MG PO TABS
5.0000 mg | ORAL_TABLET | ORAL | 3 refills | Status: DC
Start: 2023-06-16 — End: 2023-06-16

## 2023-06-16 MED ORDER — SEMAGLUTIDE-WEIGHT MANAGEMENT 0.25 MG/0.5ML ~~LOC~~ SOAJ
0.2500 mg | SUBCUTANEOUS | 0 refills | Status: AC
Start: 2023-06-16 — End: 2023-07-14

## 2023-06-16 MED ORDER — SEMAGLUTIDE-WEIGHT MANAGEMENT 0.5 MG/0.5ML ~~LOC~~ SOAJ
0.5000 mg | SUBCUTANEOUS | 0 refills | Status: AC
Start: 2023-07-15 — End: 2023-08-12

## 2023-06-16 MED ORDER — ROSUVASTATIN CALCIUM 5 MG PO TABS
5.0000 mg | ORAL_TABLET | Freq: Every day | ORAL | 3 refills | Status: DC
Start: 2023-06-16 — End: 2024-02-09

## 2023-06-16 MED ORDER — SEMAGLUTIDE-WEIGHT MANAGEMENT 1.7 MG/0.75ML ~~LOC~~ SOAJ
1.7000 mg | SUBCUTANEOUS | 0 refills | Status: AC
Start: 2023-09-11 — End: 2023-10-09

## 2023-06-16 MED ORDER — SEMAGLUTIDE-WEIGHT MANAGEMENT 2.4 MG/0.75ML ~~LOC~~ SOAJ
2.4000 mg | SUBCUTANEOUS | 0 refills | Status: AC
Start: 2023-10-10 — End: 2023-11-07

## 2023-06-16 NOTE — Assessment & Plan Note (Addendum)
They are on Lasix, Hydrochlorothiazide, and Verapamil. Blood pressure was okay at this visit, but they had not taken medication yet. We encourage them to monitor blood pressure more closely and bring in diaries at the next visit.

## 2023-06-16 NOTE — Assessment & Plan Note (Addendum)
unchanged

## 2023-06-17 NOTE — Assessment & Plan Note (Signed)
They have a history of colon polyps with no source of bleeding found and are due for a colonoscopy next year. We will continue with the planned colonoscopy.

## 2023-06-17 NOTE — Progress Notes (Signed)
[facility]: [phone]   -- Medical Office Visit --  Patient:  [name]      Age: 77 y.o.       Sex:  female  Date:   06/16/2023 Patient Care Team: Lula Olszewski, MD as PCP - General (Internal Medicine) Dulce Sellar Iline Oven, MD as PCP - Cardiology (Cardiology) Kathi Der, MD as Consulting Physician (Gastroenterology) Specialists, Dermatology (Dermatology) Today's Healthcare Provider: Lula Olszewski, MD      Assessment & Plan Weight disorder We discussed various weight loss medications, including Zepbound and Mounjaro, noting their previous success with [facility]. The importance of muscle building and high-intensity workouts for weight loss was emphasized. We will consider Zepbound through [employer] or [employer] for weight loss and encourage high-intensity workouts focusing on muscle building. We will consider Wegovy for weight loss if insurance will cover it, check cholesterol levels at the next visit, and follow up in six months.  Mild intermittent asthma without complication They exhibit seasonal wheezing and coughing, indicating mild asthma. Although they have an inhaler, it is not used regularly. We will diagnose with mild asthma and consider stronger asthma medication if symptoms persist. Vitamin D deficiency Their Vitamin D levels were normal six months ago, and they are not currently taking supplements. We will resume regular Vitamin D supplements. Pain of right shoulder joint on movement  PAF (paroxysmal atrial fibrillation) (HCC)  Onychomycosis  Morbid obesity (HCC)  Leg cramps  Intermediate risk for coronary artery disease A mild cholesterol problem was noted. They are taking Rosuvastatin 5mg  three times a week but experience muscle cramps with higher doses. We will increase Rosuvastatin to daily dosing and attempt to get approval for Repatha due to statin intolerance. Hypertension, unspecified type They are on Lasix,  Hydrochlorothiazide, and Verapamil. Blood pressure was okay at this visit, but they had not taken medication yet. We encourage them to monitor blood pressure more closely and bring in diaries at the next visit. History of colon polyps They have a history of colon polyps with no source of bleeding found and are due for a colonoscopy next year. We will continue with the planned colonoscopy. Hemorrhoids, unspecified hemorrhoid type They have a history of colon polyps with no source of bleeding found and are due for a colonoscopy next year. We will continue with the planned colonoscopy. Heart murmur  Fecal occult blood test positive  DDD (degenerative disc disease), cervical  Chronic anticoagulation They are compliant with Eliquis for stroke prevention. We will continue Eliquis as prescribed. Cervical radiculopathy  Paroxysmal atrial fibrillation (HCC) They are compliant with Eliquis for stroke prevention. We will continue Eliquis as prescribed. Anxiety unchanged Statin intolerance A mild cholesterol problem was noted. They are taking Rosuvastatin 5mg  three times a week but experience muscle cramps with higher doses. We will increase Rosuvastatin to daily dosing and attempt to get approval for Repatha due to statin intolerance. Chronic chest pain with high risk for CAD  Assessment and Plan    Obesity   Asthma  Atrial Fibrillation  Hyperlipidemia  Vitamin D Deficiency  Colon Polyps  Hypertension  General Health Maintenance / Followup Plans       ED Discharge Orders          Ordered    Semaglutide-Weight Management 2.4 MG/0.75ML SOAJ  Weekly        06/16/23 0915    Semaglutide-Weight Management 1.7 MG/0.75ML SOAJ  Weekly        06/16/23 0915    Semaglutide-Weight  Management 1 MG/0.5ML SOAJ  Weekly        06/16/23 0915    Semaglutide-Weight Management 0.5 MG/0.5ML SOAJ  Weekly        06/16/23 0915    Evolocumab (REPATHA SURECLICK) 140 MG/ML SOAJ  Every 14 days         06/16/23 0909    rosuvastatin (CRESTOR) 5 MG tablet  3 times weekly,   Status:  Discontinued        06/16/23 0909    rosuvastatin (CRESTOR) 5 MG tablet  Daily        06/16/23 0911    Semaglutide-Weight Management 0.25 MG/0.5ML SOAJ  Weekly        06/16/23 0915          Diagnoses and all orders for this visit: Weight disorder Mild intermittent asthma without complication Vitamin D deficiency Pain of right shoulder joint on movement PAF (paroxysmal atrial fibrillation) (HCC) -     rosuvastatin (CRESTOR) 5 MG tablet; Take 1 tablet (5 mg total) by mouth daily. Onychomycosis Morbid obesity (HCC) -     Semaglutide-Weight Management 0.25 MG/0.5ML SOAJ; Inject 0.25 mg into the skin once a week for 28 days. -     Semaglutide-Weight Management 0.5 MG/0.5ML SOAJ; Inject 0.5 mg into the skin once a week for 28 days. -     Semaglutide-Weight Management 1 MG/0.5ML SOAJ; Inject 1 mg into the skin once a week for 28 days. -     Semaglutide-Weight Management 1.7 MG/0.75ML SOAJ; Inject 1.7 mg into the skin once a week for 28 days. -     Semaglutide-Weight Management 2.4 MG/0.75ML SOAJ; Inject 2.4 mg into the skin once a week for 28 days. Leg cramps Intermediate risk for coronary artery disease Hypertension, unspecified type History of colon polyps Hemorrhoids, unspecified hemorrhoid type Heart murmur Fecal occult blood test positive DDD (degenerative disc disease), cervical Chronic anticoagulation Cervical radiculopathy Paroxysmal atrial fibrillation (HCC) Anxiety Statin intolerance -     Evolocumab (REPATHA SURECLICK) 140 MG/ML SOAJ; Inject 140 mg into the skin every 14 (fourteen) days. -     Semaglutide-Weight Management 0.25 MG/0.5ML SOAJ; Inject 0.25 mg into the skin once a week for 28 days. -     Semaglutide-Weight Management 0.5 MG/0.5ML SOAJ; Inject 0.5 mg into the skin once a week for 28 days. -     Semaglutide-Weight Management 1 MG/0.5ML SOAJ; Inject 1 mg into the skin once a week  for 28 days. -     Semaglutide-Weight Management 1.7 MG/0.75ML SOAJ; Inject 1.7 mg into the skin once a week for 28 days. -     Semaglutide-Weight Management 2.4 MG/0.75ML SOAJ; Inject 2.4 mg into the skin once a week for 28 days. Chronic chest pain with high risk for CAD -     Evolocumab (REPATHA SURECLICK) 140 MG/ML SOAJ; Inject 140 mg into the skin every 14 (fourteen) days. -     Semaglutide-Weight Management 0.25 MG/0.5ML SOAJ; Inject 0.25 mg into the skin once a week for 28 days. -     Semaglutide-Weight Management 0.5 MG/0.5ML SOAJ; Inject 0.5 mg into the skin once a week for 28 days. -     Semaglutide-Weight Management 1 MG/0.5ML SOAJ; Inject 1 mg into the skin once a week for 28 days. -     Semaglutide-Weight Management 1.7 MG/0.75ML SOAJ; Inject 1.7 mg into the skin once a week for 28 days. -     Semaglutide-Weight Management 2.4 MG/0.75ML SOAJ; Inject 2.4 mg into the skin once  a week for 28 days.  Recommended follow-up: No follow-ups on file. Future Appointments  Date Time Provider Department Center  07/13/2023  3:00 PM Thomasene Ripple, DO [facility] None  12/15/2023 10:00 AM Lula Olszewski, MD [facility]            Subjective   77 y.o. female who has Fecal occult blood test positive; PAF (paroxysmal atrial fibrillation) (HCC); Chronic anticoagulation; DDD (degenerative disc disease), cervical; Hypertension; Heart murmur; Atrial fibrillation (HCC); Anxiety; Pain of right shoulder joint on movement; Morbid obesity (HCC); Leg cramps; Wheezing; Vitamin D deficiency; Hemorrhoids; History of colon polyps; Intermediate risk for coronary artery disease; and Onychomycosis on their problem list. Her reasons/main concerns/chief complaints for today's office visit are 6 month follow-up   ------------------------------------------------------------------------------------------------------------------------ AI-Extracted: Discussed the use of AI scribe software for clinical note transcription  with the patient, who gave verbal consent to proceed.  History of Present Illness   The patient, with a history of hypertension, atrial fibrillation, and asthma, presents for a six-month follow-up. The patient reports intermittent chest pain, which is sometimes associated with shortness of breath, particularly when climbing stairs. The patient also mentions a history of cervical radiculopathy, which has since resolved, and a history of positive occult blood tests, which were followed up with colonoscopies that revealed polyps but no source of bleeding.  The patient has been managing their hypertension with Lasix and hydrochlorothiazide, and their atrial fibrillation with verapamil. However, they report not taking their medications on the day of the consultation. The patient also reports taking rosuvastatin for cholesterol management three times a week, but experiences muscle aches when attempting to take it more frequently.  The patient has a history of asthma, which they report is seasonal and has been causing wheezing. They carry an inhaler but report not using it regularly. The patient also reports a history of allergies, particularly to [place] grass.  They have a stationary bicycle at home, which they plan to use more frequently.  The patient also reports fatigue, particularly after work, but attributes this to age rather than a specific medical condition. They express a desire to improve their overall health and body composition.  The patient has a history of hormone pellet therapy, which they report made them feel great, but they have not continued this therapy in the past six months. They also report a family history of breast cancer.  They are also considering changing their insurance company, which may affect their medication coverage.      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  Problem list overviews that were updated at today's visit: Problem  Anxiety   Good days and bad days "Don't take medication(s) and don't want to to be honest"     Hypertension   As of 06/16/2023 Current hypertension medications:       Sig   furosemide (LASIX) 20 MG tablet (Taking) Take 1 tablet (20 mg total) by mouth 3 (three) times a week. As needed for fluid   losartan-hydrochlorothiazide (HYZAAR) 50-12.5 MG tablet (Taking) Take 1 tablet by mouth daily.   verapamil (CALAN) 80 MG tablet (Taking) TAKE 1 TABLET BY MOUTH 2 TIMES DAILY  Patient reports compliance with current medications and no significant side effect(s)  Home readings: she doesn't check       Current Outpatient Medications on File Prior to Visit  Medication Sig   albuterol (PROVENTIL) (2.5 MG/3ML) 0.083% nebulizer solution Take 3 mLs (2.5 mg total) by nebulization every 6 (six) hours as needed for wheezing or shortness of breath.   albuterol (VENTOLIN HFA) 108 (90 Base) MCG/ACT inhaler ProAir HFA 90 mcg/actuation aerosol inhaler  INHALE 1 TO 2 PUFFS EVERY 4 TO 6 HOURS AS NEEDED 30 DAYS   apixaban (ELIQUIS) 5 MG TABS tablet Take 1 tablet (5 mg total) by mouth 2 (two) times daily.   furosemide (LASIX) 20 MG tablet Take 1 tablet (20 mg total) by mouth 3 (three) times a week. As needed for fluid   losartan-hydrochlorothiazide (HYZAAR) 50-12.5 MG tablet Take 1 tablet by mouth daily.   mupirocin ointment (BACTROBAN) 2 % Apply topically.   ondansetron (ZOFRAN-ODT) 4 MG disintegrating tablet Take 1 tablet (4 mg total) by  mouth every 8 (eight) hours as needed for nausea or vomiting (for nausea from wegovy or other source).   pantoprazole (PROTONIX) 40 MG tablet Take 1 tablet (40 mg total) by mouth daily as needed (acid reflux).   Semaglutide-Weight Management 2.4 MG/0.75ML SOAJ Inject 2.4 mg into the skin once a week for 28 days.   verapamil (CALAN) 80 MG tablet TAKE 1 TABLET BY MOUTH 2 TIMES DAILY   terbinafine (LAMISIL) 250 MG tablet Take 1 tablet (250 mg total) by mouth daily. For toenail fungal infection   No current facility-administered medications on file prior to visit.   Medications Discontinued During This Encounter  Medication Reason   rosuvastatin (CRESTOR) 5 MG tablet Reorder   rosuvastatin (CRESTOR) 5 MG tablet      Objective   Physical Exam  BP 135/65 (BP Location: Left Arm, Patient Position: Sitting)   Pulse 60   Temp 97.8 F (36.6 C) (Temporal)   Ht 5\' 3"  (1.6 m)   Wt 235 lb 6.4 oz (106.8 kg)   SpO2 94%   BMI 41.70 kg/m  Wt Readings from Last 10 Encounters:  06/16/23 235 lb 6.4 oz (106.8 kg)  05/18/23 237 lb 12.8 oz (107.9 kg)  02/16/23 234 lb 12.8 oz (106.5 kg)  12/09/22 238 lb 9.6 oz (108.2 kg)  08/19/22 234 lb 6.1 oz (106.3 kg)  08/02/22 230 lb (104.3 kg)  07/08/22 234 lb (106.1 kg)  06/10/22 231 lb 3.2 oz (104.9 kg)  09/10/21 232 lb 12.8 oz (105.6 kg)  05/07/21 232 lb 1.3 oz (105.3 kg)   Vital signs reviewed.  Nursing notes reviewed. Weight trend reviewed. Abnormalities and Problem-Specific physical exam findings:  truncal adiposity - elegant and refined manner of dress and style  General Appearance:  No acute distress appreciable.   Well-groomed, healthy-appearing female.  Well proportioned with no abnormal fat distribution.  Good muscle tone. Pulmonary:  Normal work of breathing at rest, no respiratory distress apparent. SpO2: 94 %  Musculoskeletal: All extremities are intact.  Neurological:  Awake, alert, oriented, and engaged.  No obvious focal neurological deficits  or cognitive impairments.  Sensorium seems unclouded.   Speech is clear and coherent with logical content. Psychiatric:  Appropriate mood, pleasant and cooperative demeanor, thoughtful and engaged during the exam  Results   LABS Vitamin D: within normal limits (12/2022) Cholesterol: borderline (12/2022) Kidney function: within normal limits (12/2022) Liver function: within normal limits (12/2022) A1c: within normal limits (06/2022) TSH: within normal  limits (06/2022)  DIAGNOSTIC Colonoscopy: polyps detected (2022)        No results found for any visits on 06/16/23.  Office Visit on 12/09/2022  Component Date Value   WBC 12/09/2022 6.2    RBC 12/09/2022 4.54    Platelets 12/09/2022 243.0    Hemoglobin 12/09/2022 13.9    HCT 12/09/2022 41.6    MCV 12/09/2022 91.5    MCHC 12/09/2022 33.5    RDW 12/09/2022 13.5    Sodium 12/09/2022 140    Potassium 12/09/2022 4.1    Chloride 12/09/2022 103    CO2 12/09/2022 30    Glucose, Bld 12/09/2022 103 (H)    BUN 12/09/2022 13    Creatinine, Ser 12/09/2022 0.70    Total Bilirubin 12/09/2022 0.7    Alkaline Phosphatase 12/09/2022 98    AST 12/09/2022 24    ALT 12/09/2022 18    Total Protein 12/09/2022 6.6    Albumin 12/09/2022 3.9    GFR 12/09/2022 84.84    Calcium 12/09/2022 9.2    Cholesterol 12/09/2022 174    Triglycerides 12/09/2022 99.0    HDL 12/09/2022 50.90    VLDL 12/09/2022 19.8    LDL Cholesterol 12/09/2022 104 (H)    Total CHOL/HDL Ratio 12/09/2022 3    NonHDL 12/09/2022 123.34    Magnesium 12/09/2022 1.8    VITD 12/09/2022 40.59   Office Visit on 08/19/2022  Component Date Value   SARS Coronavirus 2 Ag 08/19/2022 Positive (A)    No image results found.   No results found.  MM 3D SCREENING MAMMOGRAM BILATERAL BREAST  Result Date: 02/07/2023 CLINICAL DATA:  Screening. EXAM: DIGITAL SCREENING BILATERAL MAMMOGRAM WITH TOMOSYNTHESIS AND CAD TECHNIQUE: Bilateral screening digital craniocaudal and mediolateral  oblique mammograms were obtained. Bilateral screening digital breast tomosynthesis was performed. The images were evaluated with computer-aided detection. COMPARISON:  Previous exam(s). ACR Breast Density Category a: The breasts are almost entirely fatty. FINDINGS: There are no findings suspicious for malignancy. IMPRESSION: No mammographic evidence of malignancy. A result letter of this screening mammogram will be mailed directly to the patient. RECOMMENDATION: Screening mammogram in one year. (Code:SM-B-01Y) BI-RADS CATEGORY  1: Negative. Electronically Signed   By: Elberta Fortis M.D.   On: 02/07/2023 07:58       Additional Info: This encounter employed real-time, collaborative documentation. The patient actively reviewed and updated their medical record on a shared screen, ensuring transparency and facilitating joint problem-solving for the problem list, overview, and plan. This approach promotes accurate, informed care. The treatment plan was discussed and reviewed in detail, including medication safety, potential side effects, and all patient questions. We confirmed understanding and comfort with the plan. Follow-up instructions were established, including contacting the office for any concerns, returning if symptoms worsen, persist, or new symptoms develop, and precautions for potential emergency department visits.

## 2023-06-17 NOTE — Patient Instructions (Signed)
VISIT SUMMARY:  During your recent visit, we discussed your ongoing health conditions including hypertension, atrial fibrillation, asthma, and obesity. We also touched on your history of cervical radiculopathy, positive occult blood tests, and hormone pellet therapy. You reported intermittent chest pain and shortness of breath, particularly when climbing stairs, as well as seasonal wheezing due to asthma. We discussed your current medications and their effectiveness, and considered potential changes to your treatment plan.  YOUR PLAN:  -OBESITY: We discussed weight loss medications like Zepbound and Mounjaro, and the importance of muscle building and high-intensity workouts. We will consider using Zepbound for weight loss and encourage you to focus on high-intensity workouts.  -ASTHMA: You have mild asthma, indicated by seasonal wheezing and coughing. We will consider stronger asthma medication if your symptoms persist.  -ATRIAL FIBRILLATION: You are taking Eliquis for stroke prevention due to your atrial fibrillation, an irregular heartbeat. We will continue this medication as prescribed.  -HYPERLIPIDEMIA: You have a mild cholesterol problem and are taking Rosuvastatin three times a week. We will increase this to daily dosing and try to get approval for Repatha due to your intolerance to higher doses of statins.  -VITAMIN D DEFICIENCY: Your Vitamin D levels were normal six months ago, but we will resume regular Vitamin D supplements to ensure they remain so.  -COLON POLYPS: You have a history of colon polyps but no source of bleeding was found. We will continue with the planned colonoscopy next year.  -HYPERTENSION: You are managing your high blood pressure with Lasix, Hydrochlorothiazide, and Verapamil. We encourage you to monitor your blood pressure more closely and bring in your readings at the next visit.  INSTRUCTIONS:  We will consider Wegovy for weight loss if your insurance will cover  it. We will also check your cholesterol levels at the next visit. Please continue to monitor your blood pressure and bring in your readings at the next visit. We will follow up in six months.

## 2023-06-17 NOTE — Assessment & Plan Note (Signed)
They are compliant with Eliquis for stroke prevention. We will continue Eliquis as prescribed.

## 2023-06-17 NOTE — Assessment & Plan Note (Signed)
A mild cholesterol problem was noted. They are taking Rosuvastatin 5mg  three times a week but experience muscle cramps with higher doses. We will increase Rosuvastatin to daily dosing and attempt to get approval for Repatha due to statin intolerance.

## 2023-06-17 NOTE — Assessment & Plan Note (Signed)
They exhibit seasonal wheezing and coughing, indicating mild asthma. Although they have an inhaler, it is not used regularly. We will diagnose with mild asthma and consider stronger asthma medication if symptoms persist.

## 2023-06-17 NOTE — Assessment & Plan Note (Signed)
Their Vitamin D levels were normal six months ago, and they are not currently taking supplements. We will resume regular Vitamin D supplements.

## 2023-06-20 ENCOUNTER — Telehealth: Payer: Self-pay

## 2023-06-20 ENCOUNTER — Other Ambulatory Visit (HOSPITAL_COMMUNITY): Payer: Self-pay

## 2023-06-20 NOTE — Telephone Encounter (Signed)
Pharmacy Patient Advocate Encounter   Received notification from Physician's Office that prior authorization for Repatha is required/requested.   Insurance verification completed.   The patient is insured through [facility] ADVANT[employer]/RX ADVANCE .   Per test claim: PA required; PA submitted to [facility] [employer]RX [employer] via Fax Key/confirmation #/EOC Key: [id number]   Status is pending

## 2023-06-22 DIAGNOSIS — D485 Neoplasm of uncertain behavior of skin: Secondary | ICD-10-CM | POA: Diagnosis not present

## 2023-06-22 DIAGNOSIS — C44311 Basal cell carcinoma of skin of nose: Secondary | ICD-10-CM | POA: Diagnosis not present

## 2023-06-22 DIAGNOSIS — L820 Inflamed seborrheic keratosis: Secondary | ICD-10-CM | POA: Diagnosis not present

## 2023-06-23 NOTE — Telephone Encounter (Signed)
Pharmacy Patient Advocate Encounter  Received notification from [facility] [employer]/RX [employer] that Prior Authorization for Repatha has been APPROVED from 06/20/2023 to 12/19/2023   PA #/Case ID/Reference #: [id number]

## 2023-06-28 NOTE — Telephone Encounter (Signed)
Sent my chart message informing patient of this approval.

## 2023-07-06 DIAGNOSIS — E039 Hypothyroidism, unspecified: Secondary | ICD-10-CM | POA: Diagnosis not present

## 2023-07-06 DIAGNOSIS — N951 Menopausal and female climacteric states: Secondary | ICD-10-CM | POA: Diagnosis not present

## 2023-07-11 DIAGNOSIS — C44311 Basal cell carcinoma of skin of nose: Secondary | ICD-10-CM | POA: Diagnosis not present

## 2023-07-13 ENCOUNTER — Ambulatory Visit: Payer: PPO | Admitting: Cardiology

## 2023-07-14 DIAGNOSIS — R232 Flushing: Secondary | ICD-10-CM | POA: Diagnosis not present

## 2023-07-14 DIAGNOSIS — Z79890 Hormone replacement therapy (postmenopausal): Secondary | ICD-10-CM | POA: Diagnosis not present

## 2023-07-14 DIAGNOSIS — G479 Sleep disorder, unspecified: Secondary | ICD-10-CM | POA: Diagnosis not present

## 2023-07-14 DIAGNOSIS — N951 Menopausal and female climacteric states: Secondary | ICD-10-CM | POA: Diagnosis not present

## 2023-07-14 DIAGNOSIS — E039 Hypothyroidism, unspecified: Secondary | ICD-10-CM | POA: Diagnosis not present

## 2023-07-14 DIAGNOSIS — Z6841 Body Mass Index (BMI) 40.0 and over, adult: Secondary | ICD-10-CM | POA: Diagnosis not present

## 2023-07-26 ENCOUNTER — Telehealth: Payer: Self-pay | Admitting: Internal Medicine

## 2023-07-26 NOTE — Telephone Encounter (Signed)
Prescription Request  07/26/2023  LOV: 06/16/2023  What is the name of the medication or equipment? apixaban (ELIQUIS) 5 MG TABS tablet  PT IS OUT OF MEDICATION--States pharmacy informed her they have been requesting refill for two weeks.   Have you contacted your pharmacy to request a refill? Yes   Which pharmacy would you like this sent to?  [facility] - [place], [place] - [address] [place] [address] Phone: [phone] Fax: [phone]    Patient notified that their request is being sent to the clinical staff for review and that they should receive a response within 2 business days.   Please advise at Mobile [phone] (mobile)

## 2023-07-28 ENCOUNTER — Other Ambulatory Visit: Payer: Self-pay

## 2023-07-28 DIAGNOSIS — I4891 Unspecified atrial fibrillation: Secondary | ICD-10-CM

## 2023-07-28 MED ORDER — APIXABAN 5 MG PO TABS
5.0000 mg | ORAL_TABLET | Freq: Two times a day (BID) | ORAL | 3 refills | Status: DC
  Filled 2023-10-12: qty 180, 90d supply, fill #0
  Filled 2024-01-28: qty 180, 90d supply, fill #1

## 2023-07-28 NOTE — Telephone Encounter (Signed)
Sent refill to requested pharmacy

## 2023-08-07 ENCOUNTER — Ambulatory Visit: Payer: PPO | Attending: Cardiology | Admitting: Cardiology

## 2023-08-24 DIAGNOSIS — C44311 Basal cell carcinoma of skin of nose: Secondary | ICD-10-CM | POA: Diagnosis not present

## 2023-08-31 ENCOUNTER — Other Ambulatory Visit: Payer: Self-pay | Admitting: *Deleted

## 2023-08-31 DIAGNOSIS — I10 Essential (primary) hypertension: Secondary | ICD-10-CM

## 2023-08-31 MED ORDER — LOSARTAN POTASSIUM-HCTZ 50-12.5 MG PO TABS: 1.0000 | ORAL_TABLET | Freq: Every day | ORAL | 3 refills | Status: DC

## 2023-08-31 NOTE — Telephone Encounter (Signed)
Rx sent to the pharmacy.  Copied from CRM [id number]. Topic: Clinical - Home Health Verbal Orders >> Aug 31, 2023  1:34 PM Almira Coaster wrote: Caller/Agency: Lawson Fiscal / [facility] Callback Number: [phone] Service Requested: Pharmacy is calling for a refill on patient's losartan-hydrochlorothiazide (HYZAAR) 50-12.5 MG tablet, Patient is at the pharmacy now. Frequency:  Any new concerns about the patient? No

## 2023-09-09 ENCOUNTER — Other Ambulatory Visit: Payer: Self-pay | Admitting: Internal Medicine

## 2023-09-09 DIAGNOSIS — I4891 Unspecified atrial fibrillation: Secondary | ICD-10-CM

## 2023-09-21 DIAGNOSIS — Z5189 Encounter for other specified aftercare: Secondary | ICD-10-CM | POA: Diagnosis not present

## 2023-10-02 ENCOUNTER — Other Ambulatory Visit (HOSPITAL_BASED_OUTPATIENT_CLINIC_OR_DEPARTMENT_OTHER): Payer: Self-pay

## 2023-10-02 ENCOUNTER — Encounter: Payer: Self-pay | Admitting: Family Medicine

## 2023-10-02 ENCOUNTER — Ambulatory Visit (INDEPENDENT_AMBULATORY_CARE_PROVIDER_SITE_OTHER): Payer: PPO | Admitting: Family Medicine

## 2023-10-02 ENCOUNTER — Ambulatory Visit: Payer: Self-pay | Admitting: Internal Medicine

## 2023-10-02 VITALS — Temp 98.1°F | Ht 63.0 in | Wt 237.4 lb

## 2023-10-02 DIAGNOSIS — M545 Low back pain, unspecified: Secondary | ICD-10-CM

## 2023-10-02 DIAGNOSIS — Z7901 Long term (current) use of anticoagulants: Secondary | ICD-10-CM

## 2023-10-02 DIAGNOSIS — I10 Essential (primary) hypertension: Secondary | ICD-10-CM | POA: Diagnosis not present

## 2023-10-02 LAB — POCT URINALYSIS DIPSTICK
Bilirubin, UA: NEGATIVE
Blood, UA: NEGATIVE
Glucose, UA: NEGATIVE
Ketones, UA: NEGATIVE
Leukocytes, UA: NEGATIVE
Nitrite, UA: NEGATIVE
Protein, UA: NEGATIVE
Spec Grav, UA: 1.025 (ref 1.010–1.025)
Urobilinogen, UA: 0.2 U/dL
pH, UA: 5 (ref 5.0–8.0)

## 2023-10-02 MED ORDER — DICLOFENAC SODIUM 75 MG PO TBEC
75.0000 mg | DELAYED_RELEASE_TABLET | Freq: Two times a day (BID) | ORAL | 0 refills | Status: DC
  Filled 2023-10-02: qty 5, 3d supply, fill #0
  Filled 2023-10-02: qty 15, 7d supply, fill #0

## 2023-10-02 MED ORDER — CYCLOBENZAPRINE HCL 10 MG PO TABS
10.0000 mg | ORAL_TABLET | Freq: Every evening | ORAL | 0 refills | Status: DC | PRN
Start: 2023-10-02 — End: 2024-02-29
  Filled 2023-10-02: qty 30, 30d supply, fill #0

## 2023-10-02 NOTE — Patient Instructions (Signed)
Please follow up if symptoms do not improve or as needed.    VISIT SUMMARY:  Today, we discussed your persistent left-sided back pain, which has been more intense over the past few days. We also reviewed your elevated blood pressure and general health maintenance.  YOUR PLAN:  -MECHANICAL BACK PAIN: Mechanical back pain refers to pain originating from the spine, muscles, ligaments, or joints. It is often caused by strain or sprain. We will start a short course of Voltaren and Flexeril for nighttime use. Continue using moist heat and try gentle stretching exercises. If the pain persists, consider physical therapy, massage, or chiropractic care. Monitor for any signs of fever, chills, abdominal pain, or nerve pain down the legs and report these symptoms if they occur.  -HYPERTENSION: Hypertension, or high blood pressure, can be influenced by pain and missed medication doses. It is important to resume your blood pressure medication and monitor your blood pressure regularly.  -GENERAL HEALTH MAINTENANCE: Maintaining your overall health is crucial. Engage in regular physical activity within your pain limits and consider a balanced diet and weight management to improve your quality of life.  INSTRUCTIONS:  Please follow up if you experience fever, chills, abdominal pain, or nerve pain down the legs. If your back pain persists despite the initial treatment, consider scheduling a follow-up visit.

## 2023-10-02 NOTE — Progress Notes (Signed)
Subjective  CC:  Chief Complaint  Patient presents with   Tailbone Pain    HPI: [name] is a 78 y.o. female who presents to the office today to address the problems listed above in the chief complaint. Discussed the use of AI scribe software for clinical note transcription with the patient, who gave verbal consent to proceed.  History of Present Illness   [name], a 78 year old patient, presents with a complaint of persistent left-sided back pain that has been intermittent for weeks but has intensified over the past three to four days. The pain is described as dull and aching, occasionally sharp enough to halt her movements. The pain is not severe enough to warrant medication beyond Tylenol, which provides minimal relief. The pain does not interfere with her sleep and is not associated with any abdominal discomfort.  [name] has a history of multiple surgeries, including knee replacements, shoulder rotator repair, and a total hysterectomy, all within a two-year period. This led to an altered gait and a subsequent diagnosis of a bulging disc, which was treated with an injection approximately 15 years ago. She has not experienced significant back pain since that time until the onset of the current pain a month ago.  [name] denies any recent changes in activities, falls, or injuries that could have triggered the pain. She also denies any leg pain or urinary symptoms. She has been self-managing the pain with heat application, which she reports provides significant relief. [name] has a history of being on blood thinners and has been advised against the use of anti-inflammatories. She has previously used methocarbamol for unspecified aches and pains but reports it was not particularly effective.       Assessment  1. Acute left-sided low back pain, unspecified whether sciatica present   2. Acute right-sided low back pain without sciatica      Plan  Assessment and Plan    Mechanical Back  Pain Intermittent left-sided back pain for the past month, with increased intensity over the last 3-4 days. Pain is dull, aching, occasionally sharp, and exacerbated by certain movements. No associated abdominal pain, fever, or urinary symptoms. Physical exam reveals tenderness over the left paraspinal muscles and pain with certain movements, suggesting a musculoskeletal origin. Differential diagnosis includes muscle strain, ligament sprain, or arthritis. Discussed anti-inflammatory medications' bleeding risk, especially with blood thinners. Patient prefers to avoid narcotics due to severe nausea and vomiting. - Prescribe short course of Voltaren 5-10 days. Monitor for bleeding  - Prescribe Flexeril for nighttime use - Recommend moist heat application - Advise gentle stretching exercises - Consider physical therapy, massage, or chiropractic care if symptoms persist - Instruct to monitor for fever, chills, abdominal pain, or nerve pain down the legs and report if these occur  Hypertension Elevated blood pressure likely secondary to pain and missed medication dose. Patient did not take her blood pressure medication today. Discussed pain's impact on blood pressure and the importance of adherence to medication. - Instruct to resume blood pressure medication - Monitor blood pressure regularly  General Health Maintenance Discussed the importance of managing pain and maintaining mobility to improve overall quality of life. - Encourage regular physical activity within pain limits - Discuss potential benefits of a balanced diet and weight management  Follow-up - Follow up if experiencing fever, chills, abdominal pain, or nerve pain down the legs - Consider follow-up visit if back pain persists despite initial treatment.        Orders Placed This Encounter  Procedures  POCT Urinalysis Dipstick   Meds ordered this encounter  Medications   diclofenac (VOLTAREN) 75 MG EC tablet    Sig: Take 1  tablet (75 mg total) by mouth 2 (two) times daily.    Dispense:  20 tablet    Refill:  0   cyclobenzaprine (FLEXERIL) 10 MG tablet    Sig: Take 1 tablet (10 mg total) by mouth at bedtime as needed for muscle spasms.    Dispense:  30 tablet    Refill:  0     I reviewed the patients updated PMH, FH, and SocHx.    Patient Active Problem List   Diagnosis Date Noted   Intermediate risk for coronary artery disease 02/16/2023   Onychomycosis 02/16/2023   Leg cramps 12/09/2022   Wheezing 12/09/2022   Vitamin D deficiency 12/09/2022   Hemorrhoids 12/09/2022   History of colon polyps 12/09/2022   Anxiety 06/10/2022   Morbid obesity (HCC) 06/10/2022   Pain of right shoulder joint on movement 01/19/2021   Heart murmur    Hypertension 01/31/2020   Atrial fibrillation (HCC) 01/31/2020   Chronic anticoagulation 01/24/2019   PAF (paroxysmal atrial fibrillation) (HCC) 12/10/2018   Fecal occult blood test positive 12/06/2018   DDD (degenerative disc disease), cervical 03/23/2018   Current Meds  Medication Sig   albuterol (PROVENTIL) (2.5 MG/3ML) 0.083% nebulizer solution Take 3 mLs (2.5 mg total) by nebulization every 6 (six) hours as needed for wheezing or shortness of breath.   albuterol (VENTOLIN HFA) 108 (90 Base) MCG/ACT inhaler ProAir HFA 90 mcg/actuation aerosol inhaler  INHALE 1 TO 2 PUFFS EVERY 4 TO 6 HOURS AS NEEDED 30 DAYS   apixaban (ELIQUIS) 5 MG TABS tablet Take 1 tablet (5 mg total) by mouth 2 (two) times daily.   cyclobenzaprine (FLEXERIL) 10 MG tablet Take 1 tablet (10 mg total) by mouth at bedtime as needed for muscle spasms.   diclofenac (VOLTAREN) 75 MG EC tablet Take 1 tablet (75 mg total) by mouth 2 (two) times daily.   Evolocumab (REPATHA SURECLICK) 140 MG/ML SOAJ Inject 140 mg into the skin every 14 (fourteen) days.   losartan-hydrochlorothiazide (HYZAAR) 50-12.5 MG tablet Take 1 tablet by mouth daily.   mupirocin ointment (BACTROBAN) 2 % Apply topically.   ondansetron  (ZOFRAN-ODT) 4 MG disintegrating tablet Take 1 tablet (4 mg total) by mouth every 8 (eight) hours as needed for nausea or vomiting (for nausea from wegovy or other source).   pantoprazole (PROTONIX) 40 MG tablet Take 1 tablet (40 mg total) by mouth daily as needed (acid reflux).   rosuvastatin (CRESTOR) 5 MG tablet Take 1 tablet (5 mg total) by mouth daily.   Semaglutide-Weight Management 1.7 MG/0.75ML SOAJ Inject 1.7 mg into the skin once a week for 28 days.   [START ON 10/10/2023] Semaglutide-Weight Management 2.4 MG/0.75ML SOAJ Inject 2.4 mg into the skin once a week for 28 days.   verapamil (CALAN) 80 MG tablet TAKE 1 TABLET BY MOUTH 2 TIMES DAILY    Allergies: Patient is allergic to codeine, misc. sulfonamide containing compounds, and sulfa antibiotics. Family History: Patient family history includes ADD / ADHD in her daughter and daughter; Breast cancer in her mother, paternal aunt, and paternal aunt; Depression in her daughter, daughter, and daughter; Heart attack in her maternal grandfather; Stroke in her paternal aunt and another family member. Social History:  Patient  reports that she has never smoked. She has never used smokeless tobacco. She reports current alcohol use. She reports that she does  not use drugs.  Review of Systems: Constitutional: Negative for fever malaise or anorexia Cardiovascular: negative for chest pain Respiratory: negative for SOB or persistent cough Gastrointestinal: negative for abdominal pain  Objective  Vitals: Temp 98.1 F (36.7 C)   Ht 5\' 3"  (1.6 m)   Wt 237 lb 6.4 oz (107.7 kg)   BMI 42.05 kg/m  General: no acute distress but moves cautiously, A&Ox3 Back: no CVAT, + left lateral and paravertebral mm ttp lower thoracic and upper lumbar. No spasm. FROM but pain with flexion and extension. Neg SLR. Nl strength  Office Visit on 10/02/2023  Component Date Value Ref Range Status   Color, UA 10/02/2023 yellow   Final   Clarity, UA 10/02/2023 clear    Final   Glucose, UA 10/02/2023 Negative  Negative Final   Bilirubin, UA 10/02/2023 negative   Final   Ketones, UA 10/02/2023 negative   Final   Spec Grav, UA 10/02/2023 1.025  1.010 - 1.025 Final   Blood, UA 10/02/2023 negative   Final   pH, UA 10/02/2023 5.0  5.0 - 8.0 Final   Protein, UA 10/02/2023 Negative  Negative Final   Urobilinogen, UA 10/02/2023 0.2  0.2 or 1.0 E.U./dL Final   Nitrite, UA 78/46/9629 negative   Final   Leukocytes, UA 10/02/2023 Negative  Negative Final    Commons side effects, risks, benefits, and alternatives for medications and treatment plan prescribed today were discussed, and the patient expressed understanding of the given instructions. Patient is instructed to call or message via MyChart if he/she has any questions or concerns regarding our treatment plan. No barriers to understanding were identified. We discussed Red Flag symptoms and signs in detail. Patient expressed understanding regarding what to do in case of urgent or emergency type symptoms.  Medication list was reconciled, printed and provided to the patient in AVS. Patient instructions and summary information was reviewed with the patient as documented in the AVS. This note was prepared with assistance of Dragon voice recognition software. Occasional wrong-word or sound-a-like substitutions may have occurred due to the inherent limitations of voice recognition software

## 2023-10-02 NOTE — Telephone Encounter (Signed)
Copied from CRM [id number]. Topic: Clinical - Red Word Triage >> Oct 02, 2023  8:24 AM Shelbie Proctor wrote: Red Word that prompted transfer to Nurse Triage: Patient [phone] states left side pain, grabs you pain, thinks it may be an UTI, using the restroom more often, no fever, vomiting, buring sensation or blood in urine. 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  Chief Complaint: Flank pain Symptoms: Flank pain, urinary frequency Frequency: Past 5 days Pertinent Negatives: Patient denies fever Disposition: [] ED /[] Urgent Care (no appt availability in office) / [x] Appointment(In office/virtual)/ []  [facility]/ [] Home Care/ [] Refused Recommended Disposition /[] [facility]/ []  Follow-up with PCP Additional Notes: Patient called in to report pain in her left flank and increased urinary frequency. Patient reported symptoms have been ongoing for about a month, but have worsened over the past 5 days. Patient denied fever, blood in her urine, painful urination, and decreased urinary output. Patient stated that she has had UTIs in the past and has presented with less/mild symptoms. Advised patient to see a provider within 24 hours. No availability with PCP. Scheduled patient in the office with a different provider for today. Advised patient to call back if symptoms worsen. Patient complied.   Reason for Disposition  Side (flank) or lower back pain present  Answer Assessment - Initial Assessment Questions 1. SYMPTOM: "What's the main symptom you're concerned about?" (e.g., frequency, incontinence)     Left flank pain, slight urinary frequency  2. ONSET: "When did the symptoms start?"     Symptoms have be worse over past 5 days, but symptoms have been ongoing about a month now  3. PAIN: "Is there any pain?" If Yes, ask: "How bad is it?" (Scale: 1-10; mild, moderate, severe)     Left flank pain that gets worse with movement   4. CAUSE: "What do you think is causing the  symptoms?"     UTI  5. OTHER SYMPTOMS: "Do you have any other symptoms?" (e.g., blood in urine, fever, flank pain, pain with urination)     Denies fever, denies urinary burning, denies blood in urine, denies decrease in urinary output  Protocols used: Urinary Symptoms-A-AH

## 2023-10-02 NOTE — Telephone Encounter (Signed)
Noted

## 2023-10-05 ENCOUNTER — Ambulatory Visit: Payer: PPO | Attending: Cardiology | Admitting: Cardiology

## 2023-10-12 ENCOUNTER — Other Ambulatory Visit (HOSPITAL_BASED_OUTPATIENT_CLINIC_OR_DEPARTMENT_OTHER): Payer: Self-pay

## 2023-10-12 ENCOUNTER — Other Ambulatory Visit (HOSPITAL_COMMUNITY): Payer: Self-pay

## 2023-10-12 DIAGNOSIS — E039 Hypothyroidism, unspecified: Secondary | ICD-10-CM | POA: Diagnosis not present

## 2023-10-12 DIAGNOSIS — N951 Menopausal and female climacteric states: Secondary | ICD-10-CM | POA: Diagnosis not present

## 2023-10-14 ENCOUNTER — Other Ambulatory Visit (HOSPITAL_BASED_OUTPATIENT_CLINIC_OR_DEPARTMENT_OTHER): Payer: Self-pay

## 2023-10-16 ENCOUNTER — Other Ambulatory Visit (HOSPITAL_BASED_OUTPATIENT_CLINIC_OR_DEPARTMENT_OTHER): Payer: Self-pay

## 2023-10-18 ENCOUNTER — Encounter: Payer: Self-pay | Admitting: Family

## 2023-10-18 ENCOUNTER — Ambulatory Visit: Payer: PPO | Admitting: Family

## 2023-10-18 VITALS — BP 135/74 | HR 76 | Temp 97.0°F | Ht 63.0 in | Wt 241.0 lb

## 2023-10-18 DIAGNOSIS — R053 Chronic cough: Secondary | ICD-10-CM

## 2023-10-18 DIAGNOSIS — J4521 Mild intermittent asthma with (acute) exacerbation: Secondary | ICD-10-CM | POA: Diagnosis not present

## 2023-10-18 LAB — POC COVID19 BINAXNOW: SARS Coronavirus 2 Ag: NEGATIVE

## 2023-10-18 LAB — POCT INFLUENZA A/B
Influenza A, POC: NEGATIVE
Influenza B, POC: NEGATIVE

## 2023-10-18 MED ORDER — BENZONATATE 100 MG PO CAPS
100.0000 mg | ORAL_CAPSULE | Freq: Three times a day (TID) | ORAL | 0 refills | Status: DC | PRN
Start: 2023-10-18 — End: 2024-02-09

## 2023-10-18 NOTE — Progress Notes (Unsigned)
Patient ID: CELENIA HRUSKA, female    DOB: 10-11-1945, 78 y.o.   MRN: [id number]  Chief Complaint  Patient presents with  . Cough    Pt c/o Dry Cough and wheezing, present since Monday. Has tried nebulizer treatments every 3 hours, inhaler did not help sx.             Assessment & Plan:   Subjective:    Outpatient Medications Prior to Visit  Medication Sig Dispense Refill  . albuterol (PROVENTIL) (2.5 MG/3ML) 0.083% nebulizer solution Take 3 mLs (2.5 mg total) by nebulization every 6 (six) hours as needed for wheezing or shortness of breath. 75 mL 12  . albuterol (VENTOLIN HFA) 108 (90 Base) MCG/ACT inhaler ProAir HFA 90 mcg/actuation aerosol inhaler  INHALE 1 TO 2 PUFFS EVERY 4 TO 6 HOURS AS NEEDED 30 DAYS 2 each 11  . apixaban (ELIQUIS) 5 MG TABS tablet Take 1 tablet (5 mg total) by mouth 2 (two) times daily. 180 tablet 3  . cyclobenzaprine (FLEXERIL) 10 MG tablet Take 1 tablet (10 mg total) by mouth at bedtime as needed for muscle spasms. 30 tablet 0  . diclofenac (VOLTAREN) 75 MG EC tablet Take 1 tablet (75 mg total) by mouth 2 (two) times daily. 20 tablet 0  . Evolocumab (REPATHA SURECLICK) 140 MG/ML SOAJ Inject 140 mg into the skin every 14 (fourteen) days. 2 mL 2  . losartan-hydrochlorothiazide (HYZAAR) 50-12.5 MG tablet Take 1 tablet by mouth daily. 90 tablet 3  . mupirocin ointment (BACTROBAN) 2 % Apply topically.    . ondansetron (ZOFRAN-ODT) 4 MG disintegrating tablet Take 1 tablet (4 mg total) by mouth every 8 (eight) hours as needed for nausea or vomiting (for nausea from wegovy or other source). 20 tablet 0  . pantoprazole (PROTONIX) 40 MG tablet Take 1 tablet (40 mg total) by mouth daily as needed (acid reflux). 90 tablet 3  . rosuvastatin (CRESTOR) 5 MG tablet Take 1 tablet (5 mg total) by mouth daily. 90 tablet 3  . verapamil (CALAN) 80 MG tablet TAKE 1 TABLET BY MOUTH 2 TIMES DAILY 180 tablet 2  . furosemide (LASIX) 20 MG tablet Take 1 tablet (20 mg total) by mouth 3  (three) times a week. As needed for fluid (Patient not taking: Reported on 10/18/2023) 45 tablet 3  . Semaglutide-Weight Management 2.4 MG/0.75ML SOAJ Inject 2.4 mg into the skin once a week for 28 days. (Patient not taking: Reported on 10/18/2023) 3 mL 0  . terbinafine (LAMISIL) 250 MG tablet Take 1 tablet (250 mg total) by mouth daily. For toenail fungal infection (Patient not taking: Reported on 10/18/2023) 84 tablet 0   No facility-administered medications prior to visit.   Past Medical History:  Diagnosis Date  . Atrial fibrillation (HCC)   . Cervical radiculopathy 02/23/2018  . Chaotic atrial rhythm 10/19/2018  . Chronic anticoagulation 01/24/2019  . DDD (degenerative disc disease), cervical 03/23/2018  . Fecal occult blood test positive 12/06/2018  . Heart murmur    --innocent  . Hypertension   . Hypertensive heart disease without heart failure 12/10/2018  . Leg cramps 12/09/2022   Notes when she takes fluid and potassium.Marland Kitchen doesn't take magnesium  . Morbid obesity (HCC) 06/10/2022  . PAF (paroxysmal atrial fibrillation) (HCC) 12/10/2018  . SVT (supraventricular tachycardia) (HCC) 04/24/2019   Past Surgical History:  Procedure Laterality Date  . ABDOMINAL HYSTERECTOMY  03/2010   TAH/BSO  . CESAREAN SECTION  1981  . 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 ROTATOR CUFF REPAIR Left 2010  . TOTAL KNEE ARTHROPLASTY Bilateral    Allergies  Allergen Reactions  . Codeine Nausea And Vomiting  . Misc. Sulfonamide Containing Compounds Other (See Comments) and Rash  . Sulfa Antibiotics Rash      Objective:    Physical Exam Vitals and nursing note reviewed.  Constitutional:      Appearance: Normal appearance. She is ill-appearing.     Interventions: Face mask in place.  HENT:     Right Ear: Tympanic membrane and ear canal normal.     Left Ear: Tympanic membrane and ear canal normal.     Nose:     Right Sinus: Frontal sinus tenderness  present.     Left Sinus: Frontal sinus tenderness present.     Mouth/Throat:     Mouth: Mucous membranes are moist.     Pharynx: No pharyngeal swelling, oropharyngeal exudate, posterior oropharyngeal erythema or uvula swelling.     Tonsils: No tonsillar exudate or tonsillar abscesses.  Cardiovascular:     Rate and Rhythm: Normal rate and regular rhythm.  Pulmonary:     Effort: Pulmonary effort is normal.     Breath sounds: Normal breath sounds.  Musculoskeletal:        General: Normal range of motion.  Lymphadenopathy:     Head:     Right side of head: No preauricular or posterior auricular adenopathy.     Left side of head: No preauricular or posterior auricular adenopathy.     Cervical: No cervical adenopathy.  Skin:    General: Skin is warm and dry.  Neurological:     Mental Status: She is alert.  Psychiatric:        Mood and Affect: Mood normal.        Behavior: Behavior normal.   BP 135/74 (BP Location: Left Arm, Patient Position: Sitting, Cuff Size: Normal)   Pulse 76   Temp (!) 97 F (36.1 C) (Temporal)   Ht 5\' 3"  (1.6 m)   Wt 241 lb (109.3 kg)   SpO2 94%   BMI 42.69 kg/m  Wt Readings from Last 3 Encounters:  10/18/23 241 lb (109.3 kg)  10/02/23 237 lb 6.4 oz (107.7 kg)  06/16/23 235 lb 6.4 oz (106.8 kg)       Dulce Sellar, NP

## 2023-10-19 ENCOUNTER — Telehealth: Payer: Self-pay

## 2023-10-19 MED ORDER — ALBUTEROL SULFATE (2.5 MG/3ML) 0.083% IN NEBU: 2.5000 mg | INHALATION_SOLUTION | Freq: Four times a day (QID) | RESPIRATORY_TRACT | 12 refills | Status: DC | PRN

## 2023-10-19 MED ORDER — AZITHROMYCIN 250 MG PO TABS: ORAL_TABLET | ORAL | 0 refills | Status: AC

## 2023-10-19 NOTE — Telephone Encounter (Addendum)
Copied from CRM [id number]. Topic: Clinical - Medication Question >> Oct 18, 2023  3:33 PM [name] wrote: Reason for CRM: Patient would like to know if Dr. Andree Coss would still be sending the zpack and  albuterol (PROVENTIL) (2.5 MG/3ML) 0.083% nebulizer solution over to pharmacy / please call [phone] if needed    I returned pt's call and let her know Provider is sending in RX's now. Pt verbalized understanding.

## 2023-12-07 DIAGNOSIS — R232 Flushing: Secondary | ICD-10-CM | POA: Diagnosis not present

## 2023-12-07 DIAGNOSIS — R454 Irritability and anger: Secondary | ICD-10-CM | POA: Diagnosis not present

## 2023-12-07 DIAGNOSIS — K219 Gastro-esophageal reflux disease without esophagitis: Secondary | ICD-10-CM | POA: Diagnosis not present

## 2023-12-07 DIAGNOSIS — I10 Essential (primary) hypertension: Secondary | ICD-10-CM | POA: Diagnosis not present

## 2023-12-07 DIAGNOSIS — Z79890 Hormone replacement therapy (postmenopausal): Secondary | ICD-10-CM | POA: Diagnosis not present

## 2023-12-07 DIAGNOSIS — Z6841 Body Mass Index (BMI) 40.0 and over, adult: Secondary | ICD-10-CM | POA: Diagnosis not present

## 2023-12-07 DIAGNOSIS — N898 Other specified noninflammatory disorders of vagina: Secondary | ICD-10-CM | POA: Diagnosis not present

## 2023-12-07 DIAGNOSIS — R6882 Decreased libido: Secondary | ICD-10-CM | POA: Diagnosis not present

## 2023-12-07 DIAGNOSIS — E039 Hypothyroidism, unspecified: Secondary | ICD-10-CM | POA: Diagnosis not present

## 2023-12-07 DIAGNOSIS — N951 Menopausal and female climacteric states: Secondary | ICD-10-CM | POA: Diagnosis not present

## 2023-12-07 DIAGNOSIS — I4891 Unspecified atrial fibrillation: Secondary | ICD-10-CM | POA: Diagnosis not present

## 2023-12-13 DIAGNOSIS — Z961 Presence of intraocular lens: Secondary | ICD-10-CM | POA: Diagnosis not present

## 2023-12-13 DIAGNOSIS — H5203 Hypermetropia, bilateral: Secondary | ICD-10-CM | POA: Diagnosis not present

## 2023-12-13 DIAGNOSIS — H35371 Puckering of macula, right eye: Secondary | ICD-10-CM | POA: Diagnosis not present

## 2023-12-15 ENCOUNTER — Ambulatory Visit: Payer: PPO | Admitting: Internal Medicine

## 2023-12-20 ENCOUNTER — Other Ambulatory Visit (HOSPITAL_COMMUNITY): Payer: Self-pay

## 2023-12-21 ENCOUNTER — Other Ambulatory Visit (HOSPITAL_COMMUNITY): Payer: Self-pay

## 2023-12-26 ENCOUNTER — Other Ambulatory Visit (HOSPITAL_COMMUNITY): Payer: Self-pay

## 2024-01-01 ENCOUNTER — Telehealth: Payer: Self-pay | Admitting: Cardiology

## 2024-01-01 NOTE — Telephone Encounter (Signed)
 I spoke with patient in person originally on 12/29/23 when she came to the office as we were in the process of moving. Patient thought her appointment was on 4/25 and we clarified with her when her next appointment is. We got that resolved but when speaking with her she mentioned she would need some refills but did not know which ones. I let her know I would call personally to get this refill request. Had to leave voicemail for patient about medication refill request.

## 2024-01-05 ENCOUNTER — Other Ambulatory Visit (HOSPITAL_COMMUNITY): Payer: Self-pay

## 2024-01-05 ENCOUNTER — Telehealth: Payer: Self-pay

## 2024-01-05 NOTE — Telephone Encounter (Signed)
 Pharmacy Patient Advocate Encounter   Received notification from Onbase that prior authorization for Repatha  SureClick 140MG /ML auto-injectors is required/requested.   Insurance verification completed.   The patient is insured through [employer] ADVANTAGE/RX ADVANCE .   Per test claim: PA required; PA submitted to above mentioned insurance via CoverMyMeds Key/confirmation #/EOC [id number] Status is pending

## 2024-01-08 NOTE — Telephone Encounter (Signed)
 Please advise we have not received any thing paper wise here at the office  Copied from CRM [id number]. Topic: Referral - Prior Authorization Question >> Jan 05, 2024  4:55 PM [name] wrote: Reason for CRM: pharmacy advise there is another piece of information that is needed for the prior authorization, PCP can call [phone] select option 2 for Repatha  SureClick 140MG /ML auto-injectors

## 2024-01-08 NOTE — Telephone Encounter (Signed)
 Attempt to call today go hung up on will try again tomorrow.

## 2024-01-09 ENCOUNTER — Other Ambulatory Visit: Payer: Self-pay

## 2024-01-09 DIAGNOSIS — I10 Essential (primary) hypertension: Secondary | ICD-10-CM

## 2024-01-09 NOTE — Telephone Encounter (Signed)
 Pharmacy Patient Advocate Encounter  Received notification from [employer] that Prior Authorization for Repatha  SureClick 140mg /ml has been DENIED.  Full denial letter will be uploaded to the media tab. See denial reason below.   PA #/Case ID/Reference #: [id number]

## 2024-01-09 NOTE — Telephone Encounter (Signed)
 Called pt left message about labs needing done to finish the PA.

## 2024-01-09 NOTE — Telephone Encounter (Signed)
 Additional information has been requested from the patient's insurance in order to proceed with the prior authorization request.

## 2024-01-12 ENCOUNTER — Ambulatory Visit: Payer: PPO | Attending: Cardiology | Admitting: Cardiology

## 2024-01-12 VITALS — BP 112/78 | HR 63 | Ht 62.0 in | Wt 240.4 lb

## 2024-01-12 DIAGNOSIS — E559 Vitamin D deficiency, unspecified: Secondary | ICD-10-CM | POA: Diagnosis not present

## 2024-01-12 DIAGNOSIS — I480 Paroxysmal atrial fibrillation: Secondary | ICD-10-CM

## 2024-01-12 DIAGNOSIS — I4891 Unspecified atrial fibrillation: Secondary | ICD-10-CM | POA: Diagnosis not present

## 2024-01-12 DIAGNOSIS — I10 Essential (primary) hypertension: Secondary | ICD-10-CM

## 2024-01-12 DIAGNOSIS — Z131 Encounter for screening for diabetes mellitus: Secondary | ICD-10-CM

## 2024-01-12 DIAGNOSIS — I350 Nonrheumatic aortic (valve) stenosis: Secondary | ICD-10-CM

## 2024-01-12 NOTE — Patient Instructions (Addendum)
 Medication Instructions:  Your physician recommends that you continue on your current medications as directed. Please refer to the Current Medication list given to you today.  *If you need a refill on your cardiac medications before your next appointment, please call your pharmacy*  Lab Work: HgbA1c, Vit D If you have labs (blood work) drawn today and your tests are completely normal, you will receive your results only by: MyChart Message (if you have MyChart) OR A paper copy in the mail If you have any lab test that is abnormal or we need to change your treatment, we will call you to review the results.  Testing/Procedures: Your physician has requested that you have an echocardiogram. Echocardiography is a painless test that uses sound waves to create images of your heart. It provides your doctor with information about the size and shape of your heart and how well your heart's chambers and valves are working. This procedure takes approximately one hour. There are no restrictions for this procedure. Please do NOT wear cologne, perfume, aftershave, or lotions (deodorant is allowed). Please arrive 15 minutes prior to your appointment time.  Please note: We ask at that you not bring children with you during ultrasound (echo/ vascular) testing. Due to room size and safety concerns, children are not allowed in the ultrasound rooms during exams. Our front office staff cannot provide observation of children in our lobby area while testing is being conducted. An adult accompanying a patient to their appointment will only be allowed in the ultrasound room at the discretion of the ultrasound technician under special circumstances. We apologize for any inconvenience.   Follow-Up: At [facility], you and your health needs are our priority.  As part of our continuing mission to provide you with exceptional heart care, our providers are all part of one team.  This team includes your primary  Cardiologist (physician) and Advanced Practice Providers or APPs (Physician Assistants and Nurse Practitioners) who all work together to provide you with the care you need, when you need it.  Your next appointment:   1 year(s)  Provider:   Kardie Tobb, DO

## 2024-01-13 LAB — HEMOGLOBIN A1C
Est. average glucose Bld gHb Est-mCnc: 114 mg/dL
Hgb A1c MFr Bld: 5.6 % (ref 4.8–5.6)

## 2024-01-13 LAB — VITAMIN D 25 HYDROXY (VIT D DEFICIENCY, FRACTURES): Vit D, 25-Hydroxy: 49.6 ng/mL (ref 30.0–100.0)

## 2024-01-16 ENCOUNTER — Ambulatory Visit: Payer: Self-pay | Admitting: Cardiology

## 2024-01-19 NOTE — Progress Notes (Signed)
 Cardiology Office Note:    Date:  01/19/2024   ID:  [name], DOB 1946/05/24, MRN [id number]  PCP:  Anthon Kins, MD  Cardiologist:  Jerryl Morin, DO  Electrophysiologist:  None   Referring MD: Anthon Kins, MD   No chief complaint on file.   History of Present Illness:    [name] is a 78 y.o. female with a hx of PAF   Past Medical History:  Diagnosis Date   Atrial fibrillation (HCC)    Cervical radiculopathy 02/23/2018   Chaotic atrial rhythm 10/19/2018   Chronic anticoagulation 01/24/2019   DDD (degenerative disc disease), cervical 03/23/2018   Fecal occult blood test positive 12/06/2018   Heart murmur    --innocent   Hypertension    Hypertensive heart disease without heart failure 12/10/2018   Leg cramps 12/09/2022   Notes when she takes fluid and potassium.Aaron Aas doesn't take magnesium   Morbid obesity (HCC) 06/10/2022   PAF (paroxysmal atrial fibrillation) (HCC) 12/10/2018   SVT (supraventricular tachycardia) (HCC) 04/24/2019    Past Surgical History:  Procedure Laterality Date   ABDOMINAL HYSTERECTOMY  03/2010   TAH/BSO   CESAREAN SECTION  1981   ENDOMETRIAL BIOPSY     05-26-09--benign, 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   ROTATOR CUFF REPAIR Left 2010   TOTAL KNEE ARTHROPLASTY Bilateral     Current Medications: Current Meds  Medication Sig   albuterol  (PROVENTIL ) (2.5 MG/3ML) 0.083% nebulizer solution Take 3 mLs (2.5 mg total) by nebulization every 6 (six) hours as needed for wheezing or shortness of breath.   albuterol  (VENTOLIN  HFA) 108 (90 Base) MCG/ACT inhaler ProAir  HFA 90 mcg/actuation aerosol inhaler  INHALE 1 TO 2 PUFFS EVERY 4 TO 6 HOURS AS NEEDED 30 DAYS   apixaban  (ELIQUIS ) 5 MG TABS tablet Take 1 tablet (5 mg total) by mouth 2 (two) times daily.   benzonatate  (TESSALON  PERLES) 100 MG capsule Take 1-2 capsules (100-200 mg total) by mouth 3 (three) times daily as needed for cough.   cyclobenzaprine  (FLEXERIL ) 10 MG tablet Take 1  tablet (10 mg total) by mouth at bedtime as needed for muscle spasms.   Evolocumab  (REPATHA  SURECLICK) 140 MG/ML SOAJ Inject 140 mg into the skin every 14 (fourteen) days.   losartan -hydrochlorothiazide (HYZAAR) 50-12.5 MG tablet Take 1 tablet by mouth daily.   mupirocin ointment (BACTROBAN) 2 % Apply topically.   ondansetron  (ZOFRAN -ODT) 4 MG disintegrating tablet Take 1 tablet (4 mg total) by mouth every 8 (eight) hours as needed for nausea or vomiting (for nausea from wegovy  or other source).   pantoprazole  (PROTONIX ) 40 MG tablet Take 1 tablet (40 mg total) by mouth daily as needed (acid reflux).   verapamil  (CALAN ) 80 MG tablet TAKE 1 TABLET BY MOUTH 2 TIMES DAILY     Allergies:   Codeine, Misc. sulfonamide containing compounds, and Sulfa antibiotics   Social History   Socioeconomic History   Marital status: Married    Spouse name: Not on file   Number of children: Not on file   Years of education: Not on file   Highest education level: Not on file  Occupational History   Not on file  Tobacco Use   Smoking status: Never   Smokeless tobacco: Never  Vaping Use   Vaping status: Never Used  Substance and Sexual Activity   Alcohol use: Yes    Comment: occ wine   Drug use: Never   Sexual activity: Never    Partners: Male    Birth  control/protection: Surgical    Comment: TAH/BSO  Other Topics Concern   Not on file  Social History Narrative   Not on file   Social Drivers of Health   Financial Resource Strain: Low Risk  (05/18/2023)   Overall Financial Resource Strain (CARDIA)    Difficulty of Paying Living Expenses: Not hard at all  Food Insecurity: No Food Insecurity (05/18/2023)   Hunger Vital Sign    Worried About Running Out of Food in the Last Year: Never true    Ran Out of Food in the Last Year: Never true  Transportation Needs: No Transportation Needs (05/18/2023)   PRAPARE - Administrator, Civil Service (Medical): No    Lack of Transportation  (Non-Medical): No  Physical Activity: Inactive (05/18/2023)   Exercise Vital Sign    Days of Exercise per Week: 0 days    Minutes of Exercise per Session: 0 min  Stress: No Stress Concern Present (05/18/2023)   [employer] of Occupational Health - Occupational Stress Questionnaire    Feeling of Stress : Only a little  Social Connections: Moderately Isolated (05/18/2023)   Social Connection and Isolation Panel [NHANES]    Frequency of Communication with Friends and Family: More than three times a week    Frequency of Social Gatherings with Friends and Family: More than three times a week    Attends Religious Services: Never    Database administrator or Organizations: No    Attends Engineer, structural: Never    Marital Status: Married     Family History: The patient's family history includes ADD / ADHD in her daughter and daughter; Breast cancer in her mother, paternal aunt, and paternal aunt; Depression in her daughter, daughter, and daughter; Heart attack in her maternal grandfather; Stroke in her paternal aunt and another family member.  ROS:   Review of Systems  Constitution: Negative for decreased appetite, fever and weight gain.  HENT: Negative for congestion, ear discharge, hoarse voice and sore throat.   Eyes: Negative for discharge, redness, vision loss in right eye and visual halos.  Cardiovascular: Negative for chest pain, dyspnea on exertion, leg swelling, orthopnea and palpitations.  Respiratory: Negative for cough, hemoptysis, shortness of breath and snoring.   Endocrine: Negative for heat intolerance and polyphagia.  Hematologic/Lymphatic: Negative for bleeding problem. Does not bruise/bleed easily.  Skin: Negative for flushing, nail changes, rash and suspicious lesions.  Musculoskeletal: Negative for arthritis, joint pain, muscle cramps, myalgias, neck pain and stiffness.  Gastrointestinal: Negative for abdominal pain, bowel incontinence, diarrhea and  excessive appetite.  Genitourinary: Negative for decreased libido, genital sores and incomplete emptying.  Neurological: Negative for brief paralysis, focal weakness, headaches and loss of balance.  Psychiatric/Behavioral: Negative for altered mental status, depression and suicidal ideas.  Allergic/Immunologic: Negative for HIV exposure and persistent infections.    EKGs/Labs/Other Studies Reviewed:    The following studies were reviewed today:   EKG:  The ekg ordered today demonstrates atrial fibrillation with controlled ventricular rate   Recent Labs: No results found for requested labs within last 365 days.  Recent Lipid Panel    Component Value Date/Time   CHOL 174 12/09/2022 0904   CHOL 172 09/10/2021 1016   TRIG 99.0 12/09/2022 0904   HDL 50.90 12/09/2022 0904   HDL 49 09/10/2021 1016   CHOLHDL 3 12/09/2022 0904   VLDL 19.8 12/09/2022 0904   LDLCALC 104 (H) 12/09/2022 0904   LDLCALC 97 09/10/2021 1016    Physical Exam:  VS:  BP 112/78 (BP Location: Left Arm, Patient Position: Sitting, Cuff Size: Normal)   Pulse 63   Ht 5\' 2"  (1.575 m)   Wt 240 lb 6.4 oz (109 kg)   SpO2 93%   BMI 43.97 kg/m     Wt Readings from Last 3 Encounters:  01/12/24 240 lb 6.4 oz (109 kg)  10/18/23 241 lb (109.3 kg)  10/02/23 237 lb 6.4 oz (107.7 kg)     GEN: Well nourished, well developed in no acute distress HEENT: Normal NECK: No JVD; No carotid bruits LYMPHATICS: No lymphadenopathy CARDIAC: S1S2 noted,RRR, no murmurs, rubs, gallops RESPIRATORY:  Clear to auscultation without rales, wheezing or rhonchi  ABDOMEN: Soft, non-tender, non-distended, +bowel sounds, no guarding. EXTREMITIES: No edema, No cyanosis, no clubbing MUSCULOSKELETAL:  No deformity  SKIN: Warm and dry NEUROLOGIC:  Alert and oriented x 3, non-focal PSYCHIATRIC:  Normal affect, good insight  ASSESSMENT:    1. PAF (paroxysmal atrial fibrillation) (HCC)   2. Atrial fibrillation, unspecified type (HCC)   3.  Hypertension, unspecified type   4. Vitamin D  deficiency   5. Mild aortic stenosis   6. Screening for diabetes mellitus (DM)    PLAN:    Rate control, for now will continue current medication regimen. May consider zio monitor at follow up visit - to assess PAF burdene.   Blood pressure at target  Mild Aortic stenosis on last echo - will repeat echo  The patient is in agreement with the above plan. The patient left the office in stable condition.  The patient will follow up in   Medication Adjustments/Labs and Tests Ordered: Current medicines are reviewed at length with the patient today.  Concerns regarding medicines are outlined above.  Orders Placed This Encounter  Procedures   VITAMIN D  25 Hydroxy (Vit-D Deficiency, Fractures)   Hemoglobin A1c   EKG 12-Lead   ECHOCARDIOGRAM COMPLETE   No orders of the defined types were placed in this encounter.   Patient Instructions  Medication Instructions:  Your physician recommends that you continue on your current medications as directed. Please refer to the Current Medication list given to you today.  *If you need a refill on your cardiac medications before your next appointment, please call your pharmacy*  Lab Work: HgbA1c, Vit D If you have labs (blood work) drawn today and your tests are completely normal, you will receive your results only by: MyChart Message (if you have MyChart) OR A paper copy in the mail If you have any lab test that is abnormal or we need to change your treatment, we will call you to review the results.  Testing/Procedures: Your physician has requested that you have an echocardiogram. Echocardiography is a painless test that uses sound waves to create images of your heart. It provides your doctor with information about the size and shape of your heart and how well your heart's chambers and valves are working. This procedure takes approximately one hour. There are no restrictions for this procedure. Please  do NOT wear cologne, perfume, aftershave, or lotions (deodorant is allowed). Please arrive 15 minutes prior to your appointment time.  Please note: We ask at that you not bring children with you during ultrasound (echo/ vascular) testing. Due to room size and safety concerns, children are not allowed in the ultrasound rooms during exams. Our front office staff cannot provide observation of children in our lobby area while testing is being conducted. An adult accompanying a patient to their appointment will only be allowed  in the ultrasound room at the discretion of the ultrasound technician under special circumstances. We apologize for any inconvenience.   Follow-Up: At [facility], you and your health needs are our priority.  As part of our continuing mission to provide you with exceptional heart care, our providers are all part of one team.  This team includes your primary Cardiologist (physician) and Advanced Practice Providers or APPs (Physician Assistants and Nurse Practitioners) who all work together to provide you with the care you need, when you need it.  Your next appointment:   1 year(s)  Provider:   Rollande Thursby, DO          Adopting a Healthy Lifestyle.  Know what a healthy weight is for you (roughly BMI <25) and aim to maintain this   Aim for 7+ servings of fruits and vegetables daily   65-80+ fluid ounces of water or unsweet tea for healthy kidneys   Limit to max 1 drink of alcohol per day; avoid smoking/tobacco   Limit animal fats in diet for cholesterol and heart health - choose grass fed whenever available   Avoid highly processed foods, and foods high in saturated/trans fats   Aim for low stress - take time to unwind and care for your mental health   Aim for 150 min of moderate intensity exercise weekly for heart health, and weights twice weekly for bone health   Aim for 7-9 hours of sleep daily   When it comes to diets, agreement about the perfect plan  isnt easy to find, even among the experts. Experts at the [facility] of [employer] developed an idea known as the Healthy Eating Plate. Just imagine a plate divided into logical, healthy portions.   The emphasis is on diet quality:   Load up on vegetables and fruits - one-half of your plate: Aim for color and variety, and remember that potatoes dont count.   Go for whole grains - one-quarter of your plate: Whole wheat, barley, wheat berries, quinoa, oats, brown rice, and foods made with them. If you want pasta, go with whole wheat pasta.   Protein power - one-quarter of your plate: Fish, chicken, beans, and nuts are all healthy, versatile protein sources. Limit red meat.   The diet, however, does go beyond the plate, offering a few other suggestions.   Use healthy plant oils, such as olive, canola, soy, corn, sunflower and peanut. Check the labels, and avoid partially hydrogenated oil, which have unhealthy trans fats.   If youre thirsty, drink water. Coffee and tea are good in moderation, but skip sugary drinks and limit milk and dairy products to one or two daily servings.   The type of carbohydrate in the diet is more important than the amount. Some sources of carbohydrates, such as vegetables, fruits, whole grains, and beans-are healthier than others.   Finally, stay active  Signed, Jerryl Morin, DO  01/19/2024 8:14 PM    [facility]

## 2024-01-26 ENCOUNTER — Other Ambulatory Visit: Payer: Self-pay | Admitting: Internal Medicine

## 2024-01-26 ENCOUNTER — Other Ambulatory Visit (INDEPENDENT_AMBULATORY_CARE_PROVIDER_SITE_OTHER)

## 2024-01-26 DIAGNOSIS — Z7901 Long term (current) use of anticoagulants: Secondary | ICD-10-CM

## 2024-01-26 DIAGNOSIS — I480 Paroxysmal atrial fibrillation: Secondary | ICD-10-CM

## 2024-01-26 LAB — COMPREHENSIVE METABOLIC PANEL WITH GFR
ALT: 15 U/L (ref 0–35)
AST: 24 U/L (ref 0–37)
Albumin: 4.1 g/dL (ref 3.5–5.2)
Alkaline Phosphatase: 85 U/L (ref 39–117)
BUN: 14 mg/dL (ref 6–23)
CO2: 30 meq/L (ref 19–32)
Calcium: 9.8 mg/dL (ref 8.4–10.5)
Chloride: 101 meq/L (ref 96–112)
Creatinine, Ser: 0.71 mg/dL (ref 0.40–1.20)
GFR: 82.75 mL/min (ref >60.00)
Glucose, Bld: 85 mg/dL (ref 70–99)
Potassium: 3.7 meq/L (ref 3.5–5.1)
Sodium: 139 meq/L (ref 135–145)
Total Bilirubin: 0.4 mg/dL (ref 0.2–1.2)
Total Protein: 7.2 g/dL (ref 6.0–8.3)

## 2024-01-26 LAB — CBC WITH DIFFERENTIAL/PLATELET
Basophils Absolute: 0 10*3/uL (ref 0.0–0.1)
Basophils Relative: 0.5 % (ref 0.0–3.0)
Eosinophils Absolute: 0.1 10*3/uL (ref 0.0–0.7)
Eosinophils Relative: 1.7 % (ref 0.0–5.0)
HCT: 39.7 % (ref 36.0–46.0)
Hemoglobin: 13.4 g/dL (ref 12.0–15.0)
Lymphocytes Relative: 27.1 % (ref 12.0–46.0)
Lymphs Abs: 1.6 10*3/uL (ref 0.7–4.0)
MCHC: 33.7 g/dL (ref 30.0–36.0)
MCV: 91.8 fl (ref 78.0–100.0)
Monocytes Absolute: 0.4 10*3/uL (ref 0.1–1.0)
Monocytes Relative: 7.2 % (ref 3.0–12.0)
Neutro Abs: 3.9 10*3/uL (ref 1.4–7.7)
Neutrophils Relative %: 63.5 % (ref 43.0–77.0)
Platelets: 234 10*3/uL (ref 150.0–400.0)
RBC: 4.33 Mil/uL (ref 3.87–5.11)
RDW: 13.7 % (ref 11.5–15.5)
WBC: 6.1 10*3/uL (ref 4.0–10.5)

## 2024-01-26 LAB — LIPID PANEL
Cholesterol: 156 mg/dL (ref 0–200)
HDL: 42.9 mg/dL (ref >39.00)
LDL Cholesterol: 87 mg/dL (ref 0–99)
NonHDL: 112.94
Total CHOL/HDL Ratio: 4
Triglycerides: 129 mg/dL (ref 0.0–149.0)
VLDL: 25.8 mg/dL (ref 0.0–40.0)

## 2024-01-27 ENCOUNTER — Ambulatory Visit: Payer: Self-pay | Admitting: Internal Medicine

## 2024-01-27 LAB — TSH RFX ON ABNORMAL TO FREE T4: TSH: 3.82 u[IU]/mL (ref 0.450–4.500)

## 2024-01-28 ENCOUNTER — Other Ambulatory Visit: Payer: Self-pay

## 2024-01-30 ENCOUNTER — Other Ambulatory Visit (HOSPITAL_BASED_OUTPATIENT_CLINIC_OR_DEPARTMENT_OTHER): Payer: Self-pay

## 2024-02-01 ENCOUNTER — Other Ambulatory Visit (HOSPITAL_BASED_OUTPATIENT_CLINIC_OR_DEPARTMENT_OTHER): Payer: Self-pay

## 2024-02-09 ENCOUNTER — Ambulatory Visit (INDEPENDENT_AMBULATORY_CARE_PROVIDER_SITE_OTHER): Admitting: Internal Medicine

## 2024-02-09 ENCOUNTER — Encounter: Payer: Self-pay | Admitting: Internal Medicine

## 2024-02-09 VITALS — BP 130/70 | HR 85 | Temp 98.0°F | Ht 62.0 in | Wt 238.6 lb

## 2024-02-09 DIAGNOSIS — I5189 Other ill-defined heart diseases: Secondary | ICD-10-CM | POA: Diagnosis not present

## 2024-02-09 DIAGNOSIS — R29818 Other symptoms and signs involving the nervous system: Secondary | ICD-10-CM | POA: Diagnosis not present

## 2024-02-09 DIAGNOSIS — R400 Somnolence: Secondary | ICD-10-CM

## 2024-02-09 DIAGNOSIS — Z789 Other specified health status: Secondary | ICD-10-CM | POA: Diagnosis not present

## 2024-02-09 DIAGNOSIS — I480 Paroxysmal atrial fibrillation: Secondary | ICD-10-CM | POA: Diagnosis not present

## 2024-02-09 DIAGNOSIS — I158 Other secondary hypertension: Secondary | ICD-10-CM | POA: Diagnosis not present

## 2024-02-09 DIAGNOSIS — R195 Other fecal abnormalities: Secondary | ICD-10-CM | POA: Diagnosis not present

## 2024-02-09 DIAGNOSIS — R110 Nausea: Secondary | ICD-10-CM

## 2024-02-09 DIAGNOSIS — I350 Nonrheumatic aortic (valve) stenosis: Secondary | ICD-10-CM

## 2024-02-09 DIAGNOSIS — Z9189 Other specified personal risk factors, not elsewhere classified: Secondary | ICD-10-CM | POA: Diagnosis not present

## 2024-02-09 MED ORDER — ONDANSETRON 4 MG PO TBDP: 4.0000 mg | ORAL_TABLET | Freq: Three times a day (TID) | ORAL | 0 refills | Status: DC | PRN

## 2024-02-09 MED ORDER — REPATHA SURECLICK 140 MG/ML ~~LOC~~ SOAJ: 140.0000 mg | SUBCUTANEOUS | 2 refills | Status: DC

## 2024-02-09 MED ORDER — ROSUVASTATIN CALCIUM 5 MG PO TABS: 5.0000 mg | ORAL_TABLET | Freq: Every day | ORAL | 3 refills | Status: DC

## 2024-02-09 NOTE — Progress Notes (Signed)
 ==============================  [name]: [phone]   -- Medical Office Visit --  Patient: [name]      Age: 78 y.o.       Sex:  female  Date:   02/09/2024 Today's Healthcare Provider: Anthon Kins, MD  ==============================   Chief Complaint: Hypertension and Back Pain (Has been having back pain for while now a lot more when getting up on the lower left side.)   Discussed the use of AI scribe software for clinical note transcription with the patient, who gave verbal consent to proceed.  History of Present Illness 78 year old female with atrial fibrillation and diastolic dysfunction who presents for medication management and follow-up.  She is unable to tolerate rosuvastatin  due to insurance denial, despite a ten-year risk of heart attack and stroke at 23%. She takes Lasix  as needed and has completed her course of Lamisil . Tessalon  is ineffective, and she has stopped it. She is on Eliquis  for atrial fibrillation. Her cardiologist is monitoring her condition with a CT scan scheduled next week and an echocardiogram on June 13th.  Her blood pressure is managed with Lasix , hydrochlorothiazide, verapamil , and losartan  hydrochlorothiazide. No changes to her blood pressure medications, and she is not experiencing frequent leg cramps anymore. She mentions stiffness after sitting for long periods at work.  She has a history of occult blood positive colon and undergoes colonoscopy every two years. She has hemorrhoids and polyps and is awaiting a referral for a GI consultation.  She experiences side pain, which she attributes to her mattress, and mentions sleeping on her belly with the mattress inclined. No recent neck pain despite having bad discs in her neck.  She reports drowsiness during the day and has not been tested for sleep apnea. She has difficulty sleeping at night, sometimes not falling asleep until 3 AM and waking up at  8 AM. She works Monday through Wednesday and finds it challenging to wake up early.  Her social history includes working part-time and considering retirement before next winter due to health concerns, including a severe RSV infection last winter. She has not received the RSV vaccine yet.  She is on hormone therapy with testosterone , which she finds beneficial for her well-being.   Updated Problem List Entries: Problem  Onychomycosis (Resolved)  Leg Cramps (Resolved)   Notes when she takes fluid and potassium.Aaron Aas doesn't take magnesium   Wheezing (Resolved)   Albuterol  without diagnosis of asthma, with upper respiratory infection (URI)s   Pain of Right Shoulder Joint On Movement (Resolved)   On occasional nowadays, had surgeries on rotator cuffs 2022ish on right 2012ish on left.   Fecal Occult Blood Test Positive (Resolved)   Was attributed probably to hemorrhoids she had 2 colonoscopies which just found some polyps but no source for the bleeding leaving and on the problem list    Updated these problem overview to improve longitudinal management as part of today's visit   Background Reviewed: Problem List: has PAF (paroxysmal atrial fibrillation) (HCC); Chronic anticoagulation; DDD (degenerative disc disease), cervical; Hypertension; Heart murmur; Atrial fibrillation (HCC); Anxiety; Morbid obesity (HCC); Vitamin D  deficiency; Hemorrhoids; History of colon polyps; and Intermediate risk for coronary artery disease on their problem list. 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 Past Surgical History:   has a past surgical history that includes Abdominal hysterectomy (03/2010); Total knee arthroplasty (Bilateral); Cesarean section (1981); Rotator cuff repair (Left, 2010); and Endometrial biopsy. Social History:   reports that she has never smoked. She has never used smokeless tobacco. She reports current alcohol use. She reports that she does not use drugs. Family History:  family history includes ADD / ADHD in her daughter and daughter; Breast cancer in her mother, paternal aunt, and paternal aunt; Depression in her daughter, daughter, and daughter; Heart attack in her maternal grandfather; Stroke in her paternal aunt and another family member. Allergies:  is allergic to codeine, misc. sulfonamide containing compounds, and sulfa antibiotics.   Medication Reconciliation: Current Outpatient Medications on File Prior to Visit  Medication Sig   albuterol  (PROVENTIL ) (2.5 MG/3ML) 0.083% nebulizer solution Take 3 mLs (2.5 mg total) by nebulization every 6 (six) hours as needed for wheezing or shortness of breath.   albuterol  (VENTOLIN  HFA) 108 (90 Base) MCG/ACT inhaler ProAir  HFA 90 mcg/actuation aerosol inhaler  INHALE 1 TO 2 PUFFS EVERY 4 TO 6 HOURS AS NEEDED 30 DAYS   apixaban  (ELIQUIS ) 5 MG TABS tablet Take 1 tablet (5 mg total) by mouth 2 (two) times daily.   cyclobenzaprine  (FLEXERIL ) 10 MG tablet Take 1 tablet (10 mg total) by mouth at bedtime as needed for muscle spasms.   losartan -hydrochlorothiazide (HYZAAR) 50-12.5 MG tablet Take 1 tablet by mouth daily.   mupirocin ointment (BACTROBAN) 2 % Apply topically.   verapamil  (CALAN ) 80 MG tablet TAKE 1 TABLET BY MOUTH 2 TIMES DAILY   ondansetron  (ZOFRAN -ODT) 4 MG disintegrating tablet Take 1 tablet (4 mg total) by mouth  every 8 (eight) hours as needed for nausea or vomiting (for nausea from wegovy  or other source).   No current facility-administered medications on file prior to visit.   Medications Discontinued During This Encounter  Medication Reason   pantoprazole  (PROTONIX ) 40 MG tablet Not covered by the pt's insurance   Evolocumab  (REPATHA  SURECLICK) 140 MG/ML SOAJ Not covered by the pt's insurance   furosemide  (LASIX ) 20 MG tablet Completed Course   furosemide  (LASIX ) 20 MG tablet Completed Course   terbinafine  (LAMISIL ) 250 MG tablet Completed Course   benzonatate  (TESSALON  PERLES) 100 MG capsule Completed Course   rosuvastatin  (CRESTOR ) 5 MG tablet Reorder   diclofenac  (VOLTAREN ) 75 MG EC tablet Completed Course     Physical Exam:    02/09/2024   10:56 AM 01/12/2024   10:52 AM 10/18/2023    1:54 PM  Vitals with BMI  Height 5\' 2"  5\' 2"  5\' 3"   Weight 238 lbs 10 oz 240 lbs 6 oz 241 lbs  BMI 43.63 43.96 42.7  Systolic 130 112 161  Diastolic 70 78 74  Pulse 85 63 76  Vital signs reviewed.  Nursing notes reviewed. Weight trend reviewed. Physical Exam General Appearance:  No acute distress appreciable.   Well-groomed, healthy-appearing female.  Well proportioned with no abnormal fat distribution.  Good muscle tone. Pulmonary:  Normal work of breathing at rest, no respiratory distress apparent. SpO2: 98 %  Musculoskeletal: All extremities are intact.  Neurological:  Awake, alert, oriented, and engaged.  No obvious focal neurological deficits or cognitive impairments.  Sensorium seems unclouded.   Speech is clear and coherent with logical  content. Psychiatric:  Appropriate mood, pleasant and cooperative demeanor, thoughtful and engaged during the exam Truncal adiposity     No results found for any visits on 02/09/24. Lab on 01/26/2024  Component Date Value   TSH 01/26/2024 3.820    WBC 01/26/2024 6.1    RBC 01/26/2024 4.33    Hemoglobin 01/26/2024 13.4    HCT 01/26/2024 39.7    MCV  01/26/2024 91.8    MCHC 01/26/2024 33.7    RDW 01/26/2024 13.7    Platelets 01/26/2024 234.0    Neutrophils Relative % 01/26/2024 63.5    Lymphocytes Relative 01/26/2024 27.1    Monocytes Relative 01/26/2024 7.2    Eosinophils Relative 01/26/2024 1.7    Basophils Relative 01/26/2024 0.5    Neutro Abs 01/26/2024 3.9    Lymphs Abs 01/26/2024 1.6    Monocytes Absolute 01/26/2024 0.4    Eosinophils Absolute 01/26/2024 0.1    Basophils Absolute 01/26/2024 0.0    Sodium 01/26/2024 139    Potassium 01/26/2024 3.7    Chloride 01/26/2024 101    CO2 01/26/2024 30    Glucose, Bld 01/26/2024 85    BUN 01/26/2024 14    Creatinine, Ser 01/26/2024 0.71    Total Bilirubin 01/26/2024 0.4    Alkaline Phosphatase 01/26/2024 85    AST 01/26/2024 24    ALT 01/26/2024 15    Total Protein 01/26/2024 7.2    Albumin 01/26/2024 4.1    GFR 01/26/2024 82.75    Calcium  01/26/2024 9.8    Cholesterol 01/26/2024 156    Triglycerides 01/26/2024 129.0    HDL 01/26/2024 42.90    VLDL 01/26/2024 25.8    LDL Cholesterol 01/26/2024 87    Total CHOL/HDL Ratio 01/26/2024 4    NonHDL 01/26/2024 112.94   Office Visit on 01/12/2024  Component Date Value   Vit D, 25-Hydroxy 01/12/2024 49.6    Hgb A1c MFr Bld 01/12/2024 5.6    Est. average glucose Bld* 01/12/2024 114   Office Visit on 10/18/2023  Component Date Value   Influenza A, POC 10/18/2023 Negative    Influenza B, POC 10/18/2023 Negative    SARS Coronavirus 2 Ag 10/18/2023 Negative   Office Visit on 10/02/2023  Component Date Value   Color, UA 10/02/2023 yellow    Clarity, UA 10/02/2023 clear    Glucose, UA 10/02/2023 Negative    Bilirubin, UA 10/02/2023 negative    Ketones, UA 10/02/2023 negative    Spec Grav, UA 10/02/2023 1.025    Blood, UA 10/02/2023 negative    pH, UA 10/02/2023 5.0    Protein, UA 10/02/2023 Negative    Urobilinogen, UA 10/02/2023 0.2    Nitrite, UA 10/02/2023 negative    Leukocytes, UA 10/02/2023 Negative   No image  results found. No results found.      10/02/2023    3:06 PM 10/02/2023    2:56 PM 06/16/2023    8:37 AM 05/18/2023   10:31 AM  PHQ 2/9 Scores  PHQ - 2 Score 0 0 0 0  PHQ- 9 Score   3    Results LABS Cholesterol: 104 (2024) Occult blood: Positive (2023)  RADIOLOGY CT scan: No hernia  DIAGNOSTIC Echocardiogram: Grade 2 diastolic dysfunction, mild aortic valve stenosis (2023)    Assessment & Plan PAF (paroxysmal atrial fibrillation) (HCC) She is on apixaban  for anticoagulation and under cardiology care. A CT scan is scheduled next week. She also has aortic stenosis and grade 2 diastolic dysfunction, both monitored by cardiology. Continue apixaban  5 mg orally twice  daily. Follow up with the cardiologist. At risk for coronary artery disease Encouraged patient resume Repatha . Can't tolerate statins.  The 10-year ASCVD risk score (Arnett DK, et al., 2019) is: 23.3%   Values used to calculate the score:     Age: 58 years     Sex: Female     Is Non-Hispanic African American: No     Diabetic: No     Tobacco smoker: No     Systolic Blood Pressure: 130 mmHg     Is BP treated: Yes     HDL Cholesterol: 42.9 mg/dL     Total Cholesterol: 156 mg/dL  Nonrheumatic aortic valve stenosis Aortic stenosis is monitored by cardiology. An echocardiogram is scheduled for June 13th. Grade II diastolic dysfunction Grade 2 diastolic dysfunction, likely related to undiagnosed sleep apnea, will be further assessed with an echocardiogram on June 13th. Fecal occult blood test positive A positive fecal occult blood test with a history of colon polyps and hemorrhoids necessitates a colonoscopy. Refer to gastroenterology for colonoscopy. She does q2y  Nausea Continue(s) with Zofran  as needed. Statin intolerance She experiences side effects from rosuvastatin , including myalgia and flu-like symptoms. Insurance denied coverage due to cholesterol levels, despite a 23% 10-year cardiovascular risk. Reattempt  rosuvastatin  prescription under heart disease risk. Other secondary hypertension Hypertension is well-managed with Lasix , hydrochlorothiazide, verapamil , and losartan -hydrochlorothiazide. Continue current antihypertensive regimen. Strongly suspicious for obstructive sleep apnea considering atrial fibrillation- encouraged patient to evaluate her sleep Suspected sleep apnea Daytime drowsiness suggests sleep apnea, a risk factor for atrial fibrillation and dementia. Testing is planned for late summer. Addressing sleep apnea may aid weight loss medication approval. Order sleep study for late summer. Advised to maintain a heart-healthy diet, ensure adequate vitamin D  intake, and receive the RSV vaccine before flu season. Scheduled for follow-up in six months unless needed sooner. Advised to follow up with cardiology and gastroenterology as planned.      Orders Placed During this Encounter:   Orders Placed This Encounter  Procedures   Ambulatory referral to Gastroenterology    Referral Priority:   Routine    Referral Type:   Consultation    Referral Reason:   Specialty Services Required    Number of Visits Requested:   1   Meds ordered this encounter  Medications   rosuvastatin  (CRESTOR ) 5 MG tablet    Sig: Take 1 tablet (5 mg total) by mouth daily. The 10-year ASCVD risk score (Arnett DK, et al., 2019) is: 23.3%    Dispense:  90 tablet    Refill:  3       This document was synthesized by artificial intelligence ([employer]bridge) using HIPAA-compliant recording of the clinical interaction;   We discussed the use of AI scribe software for clinical note transcription with the patient, who gave verbal consent to proceed. additional Info: This encounter employed state-of-the-art, real-time, collaborative documentation. The patient actively reviewed and assisted in updating their electronic medical record on a shared screen, ensuring transparency and facilitating joint problem-solving for the problem list,  overview, and plan. This approach promotes accurate, informed care. The treatment plan was discussed and reviewed in detail, including medication safety, potential side effects, and all patient questions. We confirmed understanding and comfort with the plan. Follow-up instructions were established, including contacting the office for any concerns, returning if symptoms worsen, persist, or new symptoms develop, and precautions for potential emergency department visits.

## 2024-02-10 DIAGNOSIS — R29818 Other symptoms and signs involving the nervous system: Secondary | ICD-10-CM | POA: Insufficient documentation

## 2024-02-10 NOTE — Patient Instructions (Signed)
 VISIT SUMMARY:  Today, we reviewed your current health conditions and medications. We discussed your atrial fibrillation, aortic stenosis, diastolic dysfunction, statin intolerance, hypertension, and positive fecal occult blood test. We also addressed your suspected sleep apnea and general health maintenance.  YOUR PLAN:  -PAROXYSMAL ATRIAL FIBRILLATION: Atrial fibrillation is an irregular and often rapid heart rate that can increase your risk of strokes, heart failure, and other heart-related complications. You are currently taking apixaban  for anticoagulation and are under the care of a cardiologist. A CT scan is scheduled for next week to monitor your condition. Please continue taking apixaban  5 mg orally twice daily and follow up with your cardiologist.  -AORTIC STENOSIS: Aortic stenosis is a narrowing of the aortic valve opening, which can restrict blood flow from the heart. This condition is being monitored by your cardiologist, and you have an echocardiogram scheduled for June 13th.  -GRADE 2 DIASTOLIC DYSFUNCTION: Diastolic dysfunction occurs when the heart has difficulty relaxing and filling with blood. This condition is likely related to undiagnosed sleep apnea and will be further assessed with an echocardiogram on June 13th.  -STATIN INTOLERANCE: Statin intolerance means you experience side effects from statin medications, such as muscle pain and flu-like symptoms. Despite insurance denial, we will reattempt to prescribe rosuvastatin  due to your high cardiovascular risk.  -HYPERTENSION: Hypertension, or high blood pressure, is well-managed with your current medications: Lasix , hydrochlorothiazide, verapamil , and losartan -hydrochlorothiazide. Please continue your current antihypertensive regimen.  -FECAL OCCULT BLOOD TEST POSITIVE: A positive fecal occult blood test indicates the presence of blood in your stool, which can be a sign of colon polyps or hemorrhoids. You will need a  colonoscopy, and a referral to gastroenterology has been made.  -SLEEP APNEA (SUSPECTED): Sleep apnea is a sleep disorder where breathing repeatedly stops and starts, leading to daytime drowsiness and other health issues. Testing is planned for late summer, and addressing this may also help with weight loss medication approval.  -GENERAL HEALTH MAINTENANCE: Please maintain a heart-healthy diet, ensure adequate vitamin D  intake, and receive the RSV vaccine before flu season.  INSTRUCTIONS:  Please follow up in six months unless needed sooner. Ensure you follow up with your cardiologist and gastroenterologist as planned. A sleep study is ordered for late summer.

## 2024-02-10 NOTE — Assessment & Plan Note (Signed)
 Hypertension is well-managed with Lasix , hydrochlorothiazide, verapamil , and losartan -hydrochlorothiazide. Continue current antihypertensive regimen. Strongly suspicious for obstructive sleep apnea considering atrial fibrillation- encouraged patient to evaluate her sleep

## 2024-02-10 NOTE — Assessment & Plan Note (Signed)
 Continue(s) with Zofran  as needed.

## 2024-02-10 NOTE — Assessment & Plan Note (Signed)
 She is on apixaban  for anticoagulation and under cardiology care. A CT scan is scheduled next week. She also has aortic stenosis and grade 2 diastolic dysfunction, both monitored by cardiology. Continue apixaban  5 mg orally twice daily. Follow up with the cardiologist.

## 2024-02-10 NOTE — Assessment & Plan Note (Signed)
 Grade 2 diastolic dysfunction, likely related to undiagnosed sleep apnea, will be further assessed with an echocardiogram on June 13th.

## 2024-02-10 NOTE — Assessment & Plan Note (Signed)
 She experiences side effects from rosuvastatin , including myalgia and flu-like symptoms. Insurance denied coverage due to cholesterol levels, despite a 23% 10-year cardiovascular risk. Reattempt rosuvastatin  prescription under heart disease risk.

## 2024-02-10 NOTE — Assessment & Plan Note (Signed)
 Daytime drowsiness suggests sleep apnea, a risk factor for atrial fibrillation and dementia. Testing is planned for late summer. Addressing sleep apnea may aid weight loss medication approval. Order sleep study for late summer.

## 2024-02-10 NOTE — Addendum Note (Signed)
 Addended by: Alix Lahmann G on: 02/10/2024 03:31 PM   Modules accepted: Level of Service

## 2024-02-10 NOTE — Assessment & Plan Note (Signed)
 A positive fecal occult blood test with a history of colon polyps and hemorrhoids necessitates a colonoscopy. Refer to gastroenterology for colonoscopy. She does q2y

## 2024-02-10 NOTE — Assessment & Plan Note (Signed)
 Aortic stenosis is monitored by cardiology. An echocardiogram is scheduled for June 13th.

## 2024-02-10 NOTE — Assessment & Plan Note (Signed)
 Encouraged patient resume Repatha . Can't tolerate statins.  The 10-year ASCVD risk score (Arnett DK, et al., 2019) is: 23.3%   Values used to calculate the score:     Age: 78 years     Sex: Female     Is Non-Hispanic African American: No     Diabetic: No     Tobacco smoker: No     Systolic Blood Pressure: 130 mmHg     Is BP treated: Yes     HDL Cholesterol: 42.9 mg/dL     Total Cholesterol: 156 mg/dL

## 2024-02-13 ENCOUNTER — Other Ambulatory Visit (HOSPITAL_COMMUNITY): Payer: Self-pay

## 2024-02-15 ENCOUNTER — Other Ambulatory Visit (HOSPITAL_COMMUNITY): Payer: Self-pay

## 2024-02-16 ENCOUNTER — Ambulatory Visit (HOSPITAL_COMMUNITY)
Admission: RE | Admit: 2024-02-16 | Discharge: 2024-02-16 | Disposition: A | Discharge status: Home / Self Care | Source: Ambulatory Visit | Attending: Cardiology | Admitting: Cardiology

## 2024-02-16 DIAGNOSIS — I350 Nonrheumatic aortic (valve) stenosis: Secondary | ICD-10-CM | POA: Diagnosis not present

## 2024-02-16 LAB — ECHOCARDIOGRAM COMPLETE
AR max vel: 1.67 cm2
AV Area VTI: 1.59 cm2
AV Area mean vel: 1.61 cm2
AV Mean grad: 14 mmHg
AV Peak grad: 22.2 mmHg
Ao pk vel: 2.36 m/s
Area-P 1/2: 3.56 cm2
S' Lateral: 2.5 cm

## 2024-02-21 ENCOUNTER — Ambulatory Visit: Admitting: Family

## 2024-02-21 VITALS — BP 126/80 | HR 64 | Temp 98.3°F | Ht 62.0 in | Wt 236.0 lb

## 2024-02-21 DIAGNOSIS — S5002XA Contusion of left elbow, initial encounter: Secondary | ICD-10-CM | POA: Diagnosis not present

## 2024-02-21 NOTE — Progress Notes (Deleted)
 Patient ID: [name], female    DOB: [birth year], 78 y.o.   MRN: [id number]  No chief complaint on file.   Subjective:    Outpatient Medications Prior to Visit  Medication Sig Dispense Refill   albuterol  (PROVENTIL ) (2.5 MG/3ML) 0.083% nebulizer solution Take 3 mLs (2.5 mg total) by nebulization every 6 (six) hours as needed for wheezing or shortness of breath. 75 mL 12   albuterol  (VENTOLIN  HFA) 108 (90 Base) MCG/ACT inhaler ProAir  HFA 90 mcg/actuation aerosol inhaler  INHALE 1 TO 2 PUFFS EVERY 4 TO 6 HOURS AS NEEDED 30 DAYS 2 each 11   apixaban  (ELIQUIS ) 5 MG TABS tablet Take 1 tablet (5 mg total) by mouth 2 (two) times daily. 180 tablet 3   cyclobenzaprine  (FLEXERIL ) 10 MG tablet Take 1 tablet (10 mg total) by mouth at bedtime as needed for muscle spasms. 30 tablet 0   Evolocumab  (REPATHA  SURECLICK) 140 MG/ML SOAJ Inject 140 mg into the skin every 14 (fourteen) days. 2 mL 2   losartan -hydrochlorothiazide (HYZAAR) 50-12.5 MG tablet Take 1 tablet by mouth daily. 90 tablet 3   mupirocin ointment (BACTROBAN) 2 % Apply topically.     ondansetron  (ZOFRAN -ODT) 4 MG disintegrating tablet Take 1 tablet (4 mg total) by mouth every 8 (eight) hours as needed for nausea or vomiting (for nausea from wegovy  or other source). 20 tablet 0   rosuvastatin  (CRESTOR ) 5 MG tablet Take 1 tablet (5 mg total) by mouth daily. The 10-year ASCVD risk score (Arnett DK, et al., 2019) is: 23.3% 90 tablet 3   verapamil  (CALAN ) 80 MG tablet TAKE 1 TABLET BY MOUTH 2 TIMES DAILY 180 tablet 2   No facility-administered medications prior to visit.   Past Medical History:  Diagnosis Date   Atrial fibrillation (HCC)    Cervical radiculopathy 02/23/2018   Chaotic atrial rhythm 10/19/2018   Chronic anticoagulation 01/24/2019   DDD (degenerative disc disease), cervical 03/23/2018   Fecal occult blood test positive 12/06/2018   Heart murmur    --innocent   Hypertension    Hypertensive heart disease without  heart failure 12/10/2018   Leg cramps 12/09/2022   Notes when she takes fluid and potassium.Aaron Aas doesn't take magnesium   Morbid obesity (HCC) 06/10/2022   Onychomycosis 02/16/2023   PAF (paroxysmal atrial fibrillation) (HCC) 12/10/2018   Pain of right shoulder joint on movement 01/19/2021   On occasional nowadays, had surgeries on rotator cuffs 2022ish on right 2012ish on left.     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 Sulfonamide Containing Compounds Other (See Comments) and Rash   Sulfa Antibiotics Rash      Objective:    Physical Exam Vitals and nursing note reviewed.  Constitutional:      Appearance: Normal appearance.   Cardiovascular:     Rate and Rhythm: Normal rate and regular rhythm.  Pulmonary:     Effort: Pulmonary effort is normal.     Breath sounds: Normal breath sounds.   Musculoskeletal:  General: Normal range of motion.   Skin:    General: Skin is warm and dry.   Neurological:     Mental Status: She is alert.   Psychiatric:        Mood and Affect: Mood normal.        Behavior: Behavior normal.    There were no vitals taken for this visit. Wt Readings from Last 3 Encounters:  02/09/24 238 lb 9.6 oz (108.2 kg)  01/12/24 240 lb 6.4 oz (109 kg)  10/18/23 241 lb (109.3 kg)       Versa Gore, NP

## 2024-02-21 NOTE — Progress Notes (Signed)
 Patient ID: [name], female    DOB: June 16, 1946, 78 y.o.   MRN: [id number]  Chief Complaint  Patient presents with   Bleeding/Bruising    Bruising on backside of left arm, about 6 days ago. She bumped it on the funny bone and nothing happened. Doesn't really hurt, states it feels like something doesn't feel right  Discussed the use of AI scribe software for clinical note transcription with the patient, who gave verbal consent to proceed.  History of Present Illness [name] is a 78 year old female who presents with elbow swelling and bruising after minor trauma.  She bumped her left elbow on the side of a door on Saturday, resulting in significant swelling and bruising. She did not think she hit it that hard, and kept on with what she was doing and didn't stop to look at it or apply ice. She said she noticed swelling and lots of bruising later that night which is still present and the area remains tender, especially at the site of impact. She continued to work and did not apply ice or elevate the elbow. She is able to bend the elbow without numbness or tingling. She is on Eliquis , taking one tablet twice daily, which may have exacerbated the bruising. She has not taken any additional medications such as aspirin and has been on Eliquis  consistently since the incident.  Assessment & Plan Elbow contusion  Elbow contusion with no swelling noted today, and mild tenderness noted at the olecranon surface with palpation, also large amount of bruising up and down arm, but has shades of light purple and yellow, slowly resolving, exacerbated by Eliquis  use. No fracture concern, elbow mildly tender with palpation, has full ROM. Reassured patient of no clots. - Hold Eliquis  for one dose tonight, resume twice bid dosing tomorrow. - Can apply ice to end of elbow where tender up to 20 min 3x/day for pain. - Ok to take Tylenol 1g 2-3x/day prn for pain - Advised to call office if  swelling returns or pain worsens into next week.  Follow-up Awaiting echocardiogram results from June 13th. - Call cardiologist for additional results or follow-up regarding echocardiogram.   Subjective:    Outpatient Medications Prior to Visit  Medication Sig Dispense Refill   acetaminophen (TYLENOL) 500 MG tablet 500 mg as needed.     albuterol  (PROVENTIL ) (2.5 MG/3ML) 0.083% nebulizer solution Take 3 mLs (2.5 mg total) by nebulization every 6 (six) hours as needed for wheezing or shortness of breath. 75 mL 12   albuterol  (VENTOLIN  HFA) 108 (90 Base) MCG/ACT inhaler ProAir  HFA 90 mcg/actuation aerosol inhaler  INHALE 1 TO 2 PUFFS EVERY 4 TO 6 HOURS AS NEEDED 30 DAYS 2 each 11   apixaban  (ELIQUIS ) 5 MG TABS tablet Take 1 tablet (5 mg total) by mouth 2 (two) times daily. 180 tablet 3   furosemide  (LASIX ) 20 MG tablet Take 20 mg by mouth daily. 3 days a week as needed.     liothyronine (CYTOMEL) 5 MCG tablet Take 10 mcg by mouth daily.     losartan -hydrochlorothiazide (HYZAAR) 50-12.5 MG tablet Take 1 tablet by mouth daily. 90 tablet 3   mupirocin ointment (BACTROBAN) 2 % Apply topically.     ondansetron  (ZOFRAN -ODT) 4 MG disintegrating tablet Take 1 tablet (4 mg total) by mouth every 8 (eight) hours as needed for nausea or vomiting (for nausea from wegovy  or other source). 20 tablet 0   verapamil  (CALAN ) 80 MG tablet TAKE  1 TABLET BY MOUTH 2 TIMES DAILY 180 tablet 2   Vitamin D , Ergocalciferol , (DRISDOL ) 1.25 MG (50000 UNIT) CAPS capsule Take 1 capsule by mouth.     cyclobenzaprine  (FLEXERIL ) 10 MG tablet Take 1 tablet (10 mg total) by mouth at bedtime as needed for muscle spasms. (Patient not taking: Reported on 02/21/2024) 30 tablet 0   Evolocumab  (REPATHA  SURECLICK) 140 MG/ML SOAJ Inject 140 mg into the skin every 14 (fourteen) days. (Patient not taking: Reported on 02/21/2024) 2 mL 2   rosuvastatin  (CRESTOR ) 5 MG tablet Take 1 tablet (5 mg total) by mouth daily. The 10-year ASCVD risk score  (Arnett DK, et al., 2019) is: 23.3% (Patient not taking: Reported on 02/21/2024) 90 tablet 3   No facility-administered medications prior to visit.   Past Medical History:  Diagnosis Date   Atrial fibrillation (HCC)    Cervical radiculopathy 02/23/2018   Chaotic atrial rhythm 10/19/2018   Chronic anticoagulation 01/24/2019   DDD (degenerative disc disease), cervical 03/23/2018   Fecal occult blood test positive 12/06/2018   Heart murmur    --innocent   Hypertension    Hypertensive heart disease without heart failure 12/10/2018   Leg cramps 12/09/2022   Notes when she takes fluid and potassium.Aaron Aas doesn't take magnesium   Morbid obesity (HCC) 06/10/2022   Onychomycosis 02/16/2023   PAF (paroxysmal atrial fibrillation) (HCC) 12/10/2018   Pain of right shoulder joint on movement 01/19/2021   On occasional nowadays, had surgeries on rotator cuffs 2022ish on right 2012ish on left.     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 Sulfonamide Containing Compounds Other (See Comments) and Rash   Sulfa Antibiotics Rash      Objective:    Physical Exam Vitals and nursing note reviewed.  Constitutional:      Appearance: Normal appearance.   Cardiovascular:     Rate and Rhythm: Normal rate and regular rhythm.  Pulmonary:     Effort: Pulmonary effort is normal.     Breath sounds: Normal breath sounds.   Musculoskeletal:        General: Normal range of motion.     Left elbow: No swelling or effusion. Normal range of motion.  Tenderness (mild) present in olecranon process.   Skin:    General: Skin is warm and dry.     Findings: Ecchymosis (large amount around left elbow and up and down forearm, upper arm, small area of dark purple, mostly lighter purple with a little yellowing) present.   Neurological:     Mental Status: She is alert.   Psychiatric:        Mood and Affect: Mood normal.        Behavior: Behavior normal.    BP 126/80   Pulse 64   Temp 98.3 F (36.8 C) (Temporal)   Ht 5' 2 (1.575 m)   Wt 236 lb (107 kg)   SpO2 91%   BMI 43.16 kg/m  Wt Readings from Last 3 Encounters:  02/21/24 236 lb (107 kg)  02/09/24 238 lb  9.6 oz (108.2 kg)  01/12/24 240 lb 6.4 oz (109 kg)       Versa Gore, NP

## 2024-02-29 ENCOUNTER — Encounter: Payer: Self-pay | Admitting: Internal Medicine

## 2024-02-29 ENCOUNTER — Other Ambulatory Visit (HOSPITAL_BASED_OUTPATIENT_CLINIC_OR_DEPARTMENT_OTHER): Payer: Self-pay

## 2024-02-29 MED ORDER — SEMAGLUTIDE-WEIGHT MANAGEMENT 0.5 MG/0.5ML ~~LOC~~ SOAJ
0.5000 mg | SUBCUTANEOUS | 2 refills | Status: DC
  Filled 2024-02-29: qty 2, fill #0

## 2024-02-29 MED ORDER — SEMAGLUTIDE-WEIGHT MANAGEMENT 1.7 MG/0.75ML ~~LOC~~ SOAJ
1.7000 mg | SUBCUTANEOUS | 2 refills | Status: DC
  Filled 2024-02-29: qty 3, fill #0

## 2024-02-29 MED ORDER — SEMAGLUTIDE-WEIGHT MANAGEMENT 2.4 MG/0.75ML ~~LOC~~ SOAJ
2.4000 mg | SUBCUTANEOUS | 0 refills | Status: AC
  Filled 2024-02-29: qty 3, 28d supply, fill #0

## 2024-02-29 MED ORDER — SEMAGLUTIDE-WEIGHT MANAGEMENT 1 MG/0.5ML ~~LOC~~ SOAJ
1.0000 mg | SUBCUTANEOUS | 2 refills | Status: AC
  Filled 2024-02-29: qty 2, fill #0

## 2024-02-29 MED ORDER — SEMAGLUTIDE-WEIGHT MANAGEMENT 0.25 MG/0.5ML ~~LOC~~ SOAJ
0.2500 mg | SUBCUTANEOUS | 2 refills | Status: DC
  Filled 2024-02-29: qty 2, 28d supply, fill #0

## 2024-03-01 ENCOUNTER — Other Ambulatory Visit (HOSPITAL_COMMUNITY): Payer: Self-pay

## 2024-03-01 ENCOUNTER — Telehealth: Payer: Self-pay

## 2024-03-01 NOTE — Telephone Encounter (Signed)
 Pharmacy Patient Advocate Encounter   Received notification from RX Request Messages that prior authorization for Wegovy  0.25MG /0.5ML auto-injectors is required/requested.   Insurance verification completed.   The patient is insured through [facility] ADVANTAGE/RX ADVANCE .   Per test claim: PA required; PA submitted to above mentioned insurance via CoverMyMeds Key/confirmation #/EOC [id number] Status is pending

## 2024-03-04 ENCOUNTER — Other Ambulatory Visit (HOSPITAL_COMMUNITY): Payer: Self-pay

## 2024-03-04 NOTE — Telephone Encounter (Signed)
 Pharmacy Patient Advocate Encounter  Received notification from [facility] ADVANTAGE/RX ADVANCE that Prior Authorization for WEGOVY  0.25MG /0.5ML has been APPROVED from 03/01/24 to 09/04/24. Ran test claim, Copay is $100.00. This test claim was processed through [facility]- copay amounts may vary at other pharmacies due to pharmacy/plan contracts, or as the patient moves through the different stages of their insurance plan.   PA #/Case ID/Reference #: [id number]

## 2024-03-05 ENCOUNTER — Other Ambulatory Visit (HOSPITAL_BASED_OUTPATIENT_CLINIC_OR_DEPARTMENT_OTHER): Payer: Self-pay

## 2024-03-28 ENCOUNTER — Encounter: Payer: Self-pay | Admitting: Internal Medicine

## 2024-03-29 ENCOUNTER — Other Ambulatory Visit: Payer: Self-pay

## 2024-03-30 MED ORDER — SEMAGLUTIDE-WEIGHT MANAGEMENT 0.5 MG/0.5ML ~~LOC~~ SOAJ: 0.5000 mg | SUBCUTANEOUS | 2 refills | Status: AC

## 2024-03-30 NOTE — Addendum Note (Signed)
 Addended by: Nami Strawder G on: 03/30/2024 08:40 AM   Modules accepted: Orders

## 2024-04-02 ENCOUNTER — Telehealth: Payer: Self-pay

## 2024-04-02 NOTE — Telephone Encounter (Signed)
 Copied from CRM [id number]. Topic: Clinical - Medication Question >> Apr 02, 2024  9:14 AM Revonda D wrote: Reason for CRM: Pt stated that she tested positive for covid today and would like for Dr.Morrison to prescribe paxlovid if possible. Pt would like a callback with an update on the medication.  Called and spoke with pt about paxlovid to advise her to make virtual visit or e visit online pt stated that just forget .

## 2024-04-24 ENCOUNTER — Ambulatory Visit (INDEPENDENT_AMBULATORY_CARE_PROVIDER_SITE_OTHER)

## 2024-04-24 VITALS — Ht 62.0 in | Wt 219.0 lb

## 2024-04-24 DIAGNOSIS — Z0000 Encounter for general adult medical examination without abnormal findings: Secondary | ICD-10-CM

## 2024-04-24 DIAGNOSIS — E2839 Other primary ovarian failure: Secondary | ICD-10-CM

## 2024-04-24 NOTE — Patient Instructions (Signed)
 [name] , Thank you for taking time out of your busy schedule to complete your Annual Wellness Visit with me. I enjoyed our conversation and look forward to speaking with you again next year. I, as well as your care team,  appreciate your ongoing commitment to your health goals. Please review the following plan we discussed and let me know if I can assist you in the future. Your Game plan/ To Do List    Referrals: If you haven't heard from the office you've been referred to, please reach out to them at the phone provided.   Follow up Visits: We will see or speak with you next year for your Next Medicare AWV with our clinical staff Have you seen your provider in the last 6 months (3 months if uncontrolled diabetes)? Yes  Clinician Recommendations:  Aim for 30 minutes of exercise or brisk walking, 6-8 glasses of water, and 5 servings of fruits and vegetables each day.       This is a list of the screenings recommended for you:  Health Maintenance  Topic Date Due   DTaP/Tdap/Td vaccine (1 - Tdap) Never done   Pneumococcal Vaccine for age over 58 (1 of 2 - PCV) 12/02/1966   DEXA scan (bone density measurement)  Never done   Flu Shot  04/05/2024   Medicare Annual Wellness Visit  05/17/2024   Zoster (Shingles) Vaccine  Completed   HPV Vaccine  Aged Out   Meningitis B Vaccine  Aged Out   Colon Cancer Screening  Discontinued   COVID-19 Vaccine  Discontinued   Hepatitis C Screening  Discontinued    Advanced directives: (Copy Requested) Please bring a copy of your health care power of attorney and living will to the office to be added to your chart at your convenience. You can mail to [facility] [address] [place], [place] [address] or email to [email or web address] Advance Care Planning is important because it:  [x]  Makes sure you receive the medical care that is consistent with your values, goals, and preferences  [x]  It provides guidance to your family and  loved ones and reduces their decisional burden about whether or not they are making the right decisions based on your wishes.  Follow the link provided in your after visit summary or read over the paperwork we have mailed to you to help you started getting your Advance Directives in place. If you need assistance in completing these, please reach out to us  so that we can help you!  See attachments for Preventive Care and Fall Prevention Tips.

## 2024-04-24 NOTE — Progress Notes (Signed)
 Subjective:   [name] is a 78 y.o. who presents for a Medicare Wellness preventive visit.  As a reminder, Annual Wellness Visits don't include a physical exam, and some assessments may be limited, especially if this visit is performed virtually. We may recommend an in-person follow-up visit with your provider if needed.  Visit Complete: Virtual I connected with  [name] on 04/24/24 by a audio enabled telemedicine application and verified that I am speaking with the correct person using two identifiers.  Patient Location: Home  Provider Location: Home Office  I discussed the limitations of evaluation and management by telemedicine. The patient expressed understanding and agreed to proceed.  Vital Signs: Because this visit was a virtual/telehealth visit, some criteria may be missing or patient reported. Any vitals not documented were not able to be obtained and vitals that have been documented are patient reported.  VideoDeclined- This patient declined Librarian, academic. Therefore the visit was completed with audio only.  Persons Participating in Visit: Patient.  AWV Questionnaire: No: Patient Medicare AWV questionnaire was not completed prior to this visit.  Cardiac Risk Factors include: advanced age (>44men, >60 women);hypertension;obesity (BMI >30kg/m2)     Objective:    Today's Vitals   04/24/24 1506  Weight: 219 lb (99.3 kg)  Height: 5' 2 (1.575 m)   Body mass index is 40.06 kg/m.     04/24/2024    3:13 PM 05/18/2023   10:36 AM 08/02/2022    2:32 PM  Advanced Directives  Does Patient Have a Medical Advance Directive? Yes Yes Yes  Type of Estate agent of [place];Living will Healthcare Power of [place];Living will Healthcare Power of [place];Living will  Copy of Healthcare Power of Attorney in Chart? No - copy requested No - copy requested No - copy requested    Current Medications  (verified) Outpatient Encounter Medications as of 04/24/2024  Medication Sig   acetaminophen (TYLENOL) 500 MG tablet 500 mg as needed.   albuterol  (PROVENTIL ) (2.5 MG/3ML) 0.083% nebulizer solution Take 3 mLs (2.5 mg total) by nebulization every 6 (six) hours as needed for wheezing or shortness of breath.   albuterol  (VENTOLIN  HFA) 108 (90 Base) MCG/ACT inhaler ProAir  HFA 90 mcg/actuation aerosol inhaler  INHALE 1 TO 2 PUFFS EVERY 4 TO 6 HOURS AS NEEDED 30 DAYS   apixaban  (ELIQUIS ) 5 MG TABS tablet Take 1 tablet (5 mg total) by mouth 2 (two) times daily.   furosemide  (LASIX ) 20 MG tablet Take 20 mg by mouth daily. 3 days a week as needed.   liothyronine (CYTOMEL) 5 MCG tablet Take 10 mcg by mouth daily.   losartan -hydrochlorothiazide (HYZAAR) 50-12.5 MG tablet Take 1 tablet by mouth daily.   mupirocin ointment (BACTROBAN) 2 % Apply topically.   ondansetron  (ZOFRAN -ODT) 4 MG disintegrating tablet Take 1 tablet (4 mg total) by mouth every 8 (eight) hours as needed for nausea or vomiting (for nausea from wegovy  or other source).   [START ON 04/27/2024] Semaglutide -Weight Management 1 MG/0.5ML SOAJ Inject 1 mg into the skin once a week for 28 days. Inject 1 mg weekly subcutaneously and if well-tolerated, increase dose to 1.7 mg weekly on the fifth week   [START ON 06/24/2024] Semaglutide -Weight Management 2.4 MG/0.75ML SOAJ Inject 2.4 mg into the skin once a week for 28 days.   verapamil  (CALAN ) 80 MG tablet TAKE 1 TABLET BY MOUTH 2 TIMES DAILY   Vitamin D , Ergocalciferol , (DRISDOL ) 1.25 MG (50000 UNIT) CAPS capsule Take 1 capsule  by mouth.   Semaglutide -Weight Management 0.25 MG/0.5ML SOAJ Inject 0.25 mg into the skin once a week. If tolerated, increase dose to 0.5 mg on the fifth week (Patient not taking: Reported on 04/24/2024)   Semaglutide -Weight Management 0.5 MG/0.5ML SOAJ Inject 0.5 mg into the skin once a week. Inject 0.5 mg weekly subcutaneously and if well-tolerated, increase dose to 1 mg weekly  on the fifth week (Patient not taking: Reported on 04/24/2024)   No facility-administered encounter medications on file as of 04/24/2024.    Allergies (verified) Codeine, Misc. sulfonamide containing compounds, and Sulfa antibiotics   History: Past Medical History:  Diagnosis Date   Atrial fibrillation (HCC)    Cervical radiculopathy 02/23/2018   Chaotic atrial rhythm 10/19/2018   Chronic anticoagulation 01/24/2019   DDD (degenerative disc disease), cervical 03/23/2018   Fecal occult blood test positive 12/06/2018   Heart murmur    --innocent   Hypertension    Hypertensive heart disease without heart failure 12/10/2018   Leg cramps 12/09/2022   Notes when she takes fluid and potassium.SABRA doesn't take magnesium   Morbid obesity (HCC) 06/10/2022   Onychomycosis 02/16/2023   PAF (paroxysmal atrial fibrillation) (HCC) 12/10/2018   Pain of right shoulder joint on movement 01/19/2021   On occasional nowadays, had surgeries on rotator cuffs 2022ish on right 2012ish on left.     SVT (supraventricular tachycardia) (HCC) 04/24/2019   Wheezing 12/09/2022   Albuterol  without diagnosis of asthma, with upper respiratory infection (URI)s     Past Surgical History:  Procedure Laterality Date   ABDOMINAL HYSTERECTOMY  03/2010   TAH/BSO   CESAREAN SECTION  1981   ENDOMETRIAL BIOPSY     05-26-09--benign, 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   ROTATOR CUFF REPAIR Left 2010   TOTAL KNEE ARTHROPLASTY Bilateral    Family History  Problem Relation Age of Onset   Breast cancer Mother    Heart attack Maternal Grandfather    Breast cancer Paternal Aunt    Stroke Paternal Aunt    Breast cancer Paternal Aunt    Stroke Other    Depression Daughter    ADD / ADHD Daughter    Depression Daughter    ADD / ADHD Daughter    Depression Daughter    Social History   Socioeconomic History   Marital status: Married    Spouse name: Not on file   Number of children: Not on file   Years of education:  Not on file   Highest education level: Associate degree: occupational, Scientist, product/process development, or vocational program  Occupational History   Not on file  Tobacco Use   Smoking status: Never   Smokeless tobacco: Never  Vaping Use   Vaping status: Never Used  Substance and Sexual Activity   Alcohol use: Yes    Comment: occ wine   Drug use: Never   Sexual activity: Never    Partners: Male    Birth control/protection: Surgical    Comment: TAH/BSO  Other Topics Concern   Not on file  Social History Narrative   Not on file   Social Drivers of Health   Financial Resource Strain: Low Risk  (04/24/2024)   Overall Financial Resource Strain (CARDIA)    Difficulty of Paying Living Expenses: Not hard at all  Food Insecurity: No Food Insecurity (04/24/2024)   Hunger Vital Sign    Worried About Running Out of Food in the Last Year: Never true    Ran Out of Food in the Last Year: Never true  Transportation Needs:  No Transportation Needs (04/24/2024)   PRAPARE - Administrator, Civil Service (Medical): No    Lack of Transportation (Non-Medical): No  Physical Activity: Inactive (04/24/2024)   Exercise Vital Sign    Days of Exercise per Week: 0 days    Minutes of Exercise per Session: 0 min  Stress: No Stress Concern Present (04/24/2024)   [employer] of Occupational Health - Occupational Stress Questionnaire    Feeling of Stress: Not at all  Social Connections: Moderately Isolated (04/24/2024)   Social Connection and Isolation Panel    Frequency of Communication with Friends and Family: More than three times a week    Frequency of Social Gatherings with Friends and Family: More than three times a week    Attends Religious Services: Never    Database administrator or Organizations: No    Attends Engineer, structural: Never    Marital Status: Married    Tobacco Counseling Counseling given: Not Answered    Clinical Intake:  Pre-visit preparation completed: Yes  Pain :  No/denies pain     BMI - recorded: 40.06 Nutritional Status: BMI > 30  Obese Nutritional Risks: None Diabetes: No  Lab Results  Component Value Date   HGBA1C 5.6 01/12/2024   HGBA1C 5.4 06/10/2022     How often do you need to have someone help you when you read instructions, pamphlets, or other written materials from your doctor or pharmacy?: 1 - Never  Interpreter Needed?: No  Information entered by :: Ellouise Haws, LPN   Activities of Daily Living     04/24/2024    3:10 PM 05/18/2023   10:27 AM  In your present state of health, do you have any difficulty performing the following activities:  Hearing? 1 1  Comment slight HOH tinnitus  Vision? 0 0  Difficulty concentrating or making decisions? 0 0  Walking or climbing stairs? 0 0  Dressing or bathing? 0 0  Doing errands, shopping? 0 0  Preparing Food and eating ? N N  Using the Toilet? N N  In the past six months, have you accidently leaked urine? N N  Do you have problems with loss of bowel control? N N  Managing your Medications? N N  Managing your Finances? N N  Housekeeping or managing your Housekeeping? N N    Patient Care Team: Jesus Bernardino MATSU, MD as PCP - General (Internal Medicine) Tobb, Kardie, DO as PCP - Cardiology (Cardiology) Elicia Claw, MD as Consulting Physician (Gastroenterology) Specialists, Dermatology (Dermatology)   I have updated your Care Teams any recent Medical Services you may have received from other providers in the past year.     Assessment:   This is a routine wellness examination for [place].  Hearing/Vision screen Hearing Screening - Comments:: Slight hearing loss  Vision Screening - Comments:: Wears rx glasses - up to date with routine eye exams with Dr Charmayne    Goals Addressed             This Visit's Progress    Patient Stated       Weight loss       Depression Screen     04/24/2024    3:11 PM 10/02/2023    3:06 PM 10/02/2023    2:56 PM 06/16/2023     8:37 AM 05/18/2023   10:31 AM 12/09/2022    8:11 AM 08/02/2022    2:33 PM  PHQ 2/9 Scores  PHQ - 2 Score 0 0  0 0 0 0 0  PHQ- 9 Score    3  3     Fall Risk     04/24/2024    3:13 PM 10/02/2023    3:05 PM 10/02/2023    2:55 PM 06/16/2023    8:37 AM 05/18/2023   10:37 AM  Fall Risk   Falls in the past year? 0 0 0 0 1  Number falls in past yr: 0 0 0 0 1  Comment     rolled off  Injury with Fall? 0 0 0 0 0  Risk for fall due to : No Fall Risks No Fall Risks No Fall Risks No Fall Risks Impaired vision  Follow up Falls prevention discussed Falls evaluation completed Falls evaluation completed Falls evaluation completed Falls prevention discussed    MEDICARE RISK AT HOME:  Medicare Risk at Home Any stairs in or around the home?: Yes If so, are there any without handrails?: No Home free of loose throw rugs in walkways, pet beds, electrical cords, etc?: Yes Adequate lighting in your home to reduce risk of falls?: Yes Life alert?: No Use of a cane, walker or w/c?: No Grab bars in the bathroom?: No Shower chair or bench in shower?: No Elevated toilet seat or a handicapped toilet?: No  TIMED UP AND GO:  Was the test performed?  No  Cognitive Function: 6CIT completed        04/24/2024    3:13 PM 05/18/2023   10:39 AM 08/02/2022    2:37 PM  6CIT Screen  What Year? 0 points 0 points 0 points  What month? 0 points 0 points 0 points  What time? 0 points 0 points 0 points  Count back from 20 0 points 0 points 0 points  Months in reverse 0 points 0 points 0 points  Repeat phrase 0 points 2 points 0 points  Total Score 0 points 2 points 0 points    Immunizations Immunization History  Administered Date(s) Administered   Fluad Trivalent(High Dose 65+) 05/18/2023   Influenza-Unspecified 05/06/2014   Pneumococcal-Unspecified 03/02/2015   Unspecified SARS-COV-2 Vaccination 09/19/2019, 10/10/2019   Zoster Recombinant(Shingrix) 01/23/2019, 04/26/2019    Screening Tests Health  Maintenance  Topic Date Due   DTaP/Tdap/Td (1 - Tdap) Never done   Pneumococcal Vaccine: 50+ Years (1 of 2 - PCV) 12/02/1966   DEXA SCAN  Never done   INFLUENZA VACCINE  04/05/2024   Medicare Annual Wellness (AWV)  04/24/2025   Zoster Vaccines- Shingrix  Completed   HPV VACCINES  Aged Out   Meningococcal B Vaccine  Aged Out   Colonoscopy  Discontinued   COVID-19 Vaccine  Discontinued   Hepatitis C Screening  Discontinued    Health Maintenance  Health Maintenance Due  Topic Date Due   DTaP/Tdap/Td (1 - Tdap) Never done   Pneumococcal Vaccine: 50+ Years (1 of 2 - PCV) 12/02/1966   DEXA SCAN  Never done   INFLUENZA VACCINE  04/05/2024   Health Maintenance Items Addressed: See Nurse Notes at the end of this note  Additional Screening:  Vision Screening: Recommended annual ophthalmology exams for early detection of glaucoma and other disorders of the eye. Would you like a referral to an eye doctor? No    Dental Screening: Recommended annual dental exams for proper oral hygiene  Community Resource Referral / Chronic Care Management: CRR required this visit?  No   CCM required this visit?  No   Plan:    I have personally reviewed and  noted the following in the patient's chart:   Medical and social history Use of alcohol, tobacco or illicit drugs  Current medications and supplements including opioid prescriptions. Patient is not currently taking opioid prescriptions. Functional ability and status Nutritional status Physical activity Advanced directives List of other physicians Hospitalizations, surgeries, and ER visits in previous 12 months Vitals Screenings to include cognitive, depression, and falls Referrals and appointments  In addition, I have reviewed and discussed with patient certain preventive protocols, quality metrics, and best practice recommendations. A written personalized care plan for preventive services as well as general preventive health  recommendations were provided to patient.   Ellouise VEAR Haws, LPN   1/79/7974   After Visit Summary: (MyChart) Due to this being a telephonic visit, the after visit summary with patients personalized plan was offered to patient via MyChart   Notes: Nothing significant to report at this time. Immunizations discussed and dexa scan order placed

## 2024-05-13 ENCOUNTER — Other Ambulatory Visit (HOSPITAL_BASED_OUTPATIENT_CLINIC_OR_DEPARTMENT_OTHER): Payer: Self-pay

## 2024-05-19 ENCOUNTER — Encounter: Payer: Self-pay | Admitting: Internal Medicine

## 2024-05-19 DIAGNOSIS — E669 Obesity, unspecified: Secondary | ICD-10-CM

## 2024-05-19 DIAGNOSIS — Z9189 Other specified personal risk factors, not elsewhere classified: Secondary | ICD-10-CM

## 2024-05-24 MED ORDER — WEGOVY 1.7 MG/0.75ML ~~LOC~~ SOAJ: 1.7000 mg | SUBCUTANEOUS | 5 refills | Status: DC

## 2024-05-24 NOTE — Addendum Note (Signed)
 Addended by: Sylina Henion G on: 05/24/2024 12:21 PM   Modules accepted: Orders

## 2024-05-24 NOTE — Telephone Encounter (Signed)
 Patient came in office, stating she forgot to hit the send button on her earlier message days ago. Patient states her and husband run out of current dosage on Sunday and need new rx for 1.7 mg of Wegovy . Patient is requesting this be sent to [facility] on [address] ASAP.

## 2024-05-30 ENCOUNTER — Ambulatory Visit: Admitting: Family

## 2024-05-31 ENCOUNTER — Ambulatory Visit: Admitting: Family

## 2024-06-03 ENCOUNTER — Encounter: Payer: Self-pay | Admitting: Family

## 2024-06-03 ENCOUNTER — Ambulatory Visit (INDEPENDENT_AMBULATORY_CARE_PROVIDER_SITE_OTHER): Admitting: Family

## 2024-06-03 VITALS — BP 106/70 | HR 61 | Temp 98.0°F | Wt 217.4 lb

## 2024-06-03 DIAGNOSIS — M25422 Effusion, left elbow: Secondary | ICD-10-CM | POA: Diagnosis not present

## 2024-06-03 NOTE — Progress Notes (Unsigned)
 Patient ID: [name], female    DOB: 16-Aug-1946, 78 y.o.   MRN: [id number]  Chief Complaint  Patient presents with  . Elbow Pain    Pt c/o left elbow soreness, present for 1 week.   Discussed the use of AI scribe software for clinical note transcription with the patient, who gave verbal consent to proceed.  History of Present Illness   [name] is a 78 year old female on Eliquis  who presents with elbow swelling.  She experiences swelling in the elbow without pain. Previously, swelling was accompanied by bruising extending under her arm. No recent trauma or injury to the elbow is identified, though she frequently rests her elbow on surfaces at work. In the past, she had significant bone pain at the elbow, which she managed with a pad. A 'bulge' in the elbow appeared before the bruising. She is on Eliquis , which may contribute to the bruising. She does not recall any recent incidents of bumping her elbow.     Assessment and Plan    Elbow swelling (possible olecranon bursitis or nontraumatic hematoma) Swelling without pain, likely olecranon bursitis or hematoma, possibly related to Eliquis . No trauma or gout. Movement unrestricted. - Refer to [facility] for evaluation and possible drainage.  Obesity Obesity with 16-pound weight loss over three months. Current medication effective with minor nausea managed by Zofran . Discussed dietary habits and muscle mass maintenance. - Continue medication at 1.7 mg as long as weight loss continues. - Encourage small, frequent meals and consider low-sugar protein shakes. - Advise weight training exercises to prevent muscle loss.      Subjective:    Outpatient Medications Prior to Visit  Medication Sig Dispense Refill  . acetaminophen (TYLENOL) 500 MG tablet 500 mg as needed.    . albuterol  (PROVENTIL ) (2.5 MG/3ML) 0.083% nebulizer solution Take 3 mLs (2.5 mg total) by nebulization every 6 (six) hours as needed  for wheezing or shortness of breath. 75 mL 12  . albuterol  (VENTOLIN  HFA) 108 (90 Base) MCG/ACT inhaler ProAir  HFA 90 mcg/actuation aerosol inhaler  INHALE 1 TO 2 PUFFS EVERY 4 TO 6 HOURS AS NEEDED 30 DAYS 2 each 11  . apixaban  (ELIQUIS ) 5 MG TABS tablet Take 1 tablet (5 mg total) by mouth 2 (two) times daily. 180 tablet 3  . furosemide  (LASIX ) 20 MG tablet Take 20 mg by mouth daily. 3 days a week as needed.    SABRA liothyronine (CYTOMEL) 5 MCG tablet Take 10 mcg by mouth daily.    . losartan -hydrochlorothiazide (HYZAAR) 50-12.5 MG tablet Take 1 tablet by mouth daily. 90 tablet 3  . mupirocin ointment (BACTROBAN) 2 % Apply topically.    . ondansetron  (ZOFRAN -ODT) 4 MG disintegrating tablet Take 1 tablet (4 mg total) by mouth every 8 (eight) hours as needed for nausea or vomiting (for nausea from wegovy  or other source). 20 tablet 0  . semaglutide -weight management (WEGOVY ) 1.7 MG/0.75ML SOAJ SQ injection Inject 1.7 mg into the skin once a week. 3 mL 5  . [START ON 06/24/2024] Semaglutide -Weight Management 2.4 MG/0.75ML SOAJ Inject 2.4 mg into the skin once a week for 28 days. 3 mL 0  . verapamil  (CALAN ) 80 MG tablet TAKE 1 TABLET BY MOUTH 2 TIMES DAILY 180 tablet 2  . Vitamin D , Ergocalciferol , (DRISDOL ) 1.25 MG (50000 UNIT) CAPS capsule Take 1 capsule by mouth.    . Semaglutide -Weight Management 0.25 MG/0.5ML SOAJ Inject 0.25 mg into the skin once a week. If tolerated,  increase dose to 0.5 mg on the fifth week (Patient not taking: Reported on 04/24/2024) 2 mL 2  . Semaglutide -Weight Management 0.5 MG/0.5ML SOAJ Inject 0.5 mg into the skin once a week. Inject 0.5 mg weekly subcutaneously and if well-tolerated, increase dose to 1 mg weekly on the fifth week (Patient not taking: Reported on 04/24/2024) 2 mL 2   No facility-administered medications prior to visit.   Past Medical History:  Diagnosis Date  . Atrial fibrillation (HCC)   . Cervical radiculopathy 02/23/2018  . Chaotic atrial rhythm  10/19/2018  . Chronic anticoagulation 01/24/2019  . DDD (degenerative disc disease), cervical 03/23/2018  . Fecal occult blood test positive 12/06/2018  . Heart murmur    --innocent  . Hypertension   . Hypertensive heart disease without heart failure 12/10/2018  . Leg cramps 12/09/2022   Notes when she takes fluid and potassium.SABRA doesn't take magnesium  . Morbid obesity (HCC) 06/10/2022  . Onychomycosis 02/16/2023  . PAF (paroxysmal atrial fibrillation) (HCC) 12/10/2018  . Pain of right shoulder joint on movement 01/19/2021   On occasional nowadays, had surgeries on rotator cuffs 2022ish on right 2012ish on left.    SABRA SVT (supraventricular tachycardia) 04/24/2019  . Wheezing 12/09/2022   Albuterol  without diagnosis of asthma, with upper respiratory infection (URI)s     Past Surgical History:  Procedure Laterality Date  . ABDOMINAL HYSTERECTOMY  03/2010   TAH/BSO  . CESAREAN SECTION  1981  . 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 ROTATOR CUFF REPAIR Left 2010  . TOTAL KNEE ARTHROPLASTY Bilateral    Allergies  Allergen Reactions  . Codeine Nausea And Vomiting  . Misc. Sulfonamide Containing Compounds Other (See Comments) and Rash  . Sulfa Antibiotics Rash      Objective:    Physical Exam Vitals and nursing note reviewed.  Constitutional:      Appearance: Normal appearance.  Cardiovascular:     Rate and Rhythm: Normal rate and regular rhythm.  Pulmonary:     Effort: Pulmonary effort is normal.     Breath sounds: Normal breath sounds.  Musculoskeletal:        General: Normal range of motion.  Skin:    General: Skin is warm and dry.  Neurological:     Mental Status: She is alert.  Psychiatric:        Mood and Affect: Mood normal.        Behavior: Behavior normal.    BP 106/70 (BP Location: Left Arm, Patient Position: Sitting, Cuff Size: Large)   Pulse 61   Temp 98 F (36.7 C) (Temporal)   Wt 217 lb 6.4 oz (98.6 kg)    SpO2 98%   BMI 39.76 kg/m  Wt Readings from Last 3 Encounters:  06/03/24 217 lb 6.4 oz (98.6 kg)  04/24/24 219 lb (99.3 kg)  02/21/24 236 lb (107 kg)       Lucius Krabbe, NP

## 2024-06-06 ENCOUNTER — Encounter: Payer: Self-pay | Admitting: Family Medicine

## 2024-06-06 ENCOUNTER — Other Ambulatory Visit: Payer: Self-pay

## 2024-06-06 ENCOUNTER — Ambulatory Visit: Admitting: Family Medicine

## 2024-06-06 VITALS — BP 118/76 | HR 84 | Ht 62.0 in | Wt 219.0 lb

## 2024-06-06 DIAGNOSIS — M7022 Olecranon bursitis, left elbow: Secondary | ICD-10-CM

## 2024-06-06 NOTE — Progress Notes (Signed)
   I, Leotis Batter, CMA acting as a scribe for Artist Lloyd, MD.  [name] is a 78 y.o. female who presents to [facility] today for L elbow pain ongoing for about 2 wks. No trauma/injury. Pt locates pain to the olecranon process, minimal pain, looks swollen, some erythema. Notes waking with bruising in the upper arm with no known cause, has since resolved. Sx present since this past spring, no injury. No Eliquis . Occasional shoulder pain, hx of shoulder surgery.   Swelling: present Aggravates: more swelling than pain Treatments tried: none  Pertinent review of systems: No fevers or chills  Relevant historical information: Anticoagulated with Eliquis .  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   Exam:  BP 118/76   Pulse 84   Ht 5' 2 (1.575 m)   Wt 219 lb (99.3 kg)   SpO2 94%   BMI 40.06 kg/m  General: Well Developed, well nourished, and in no acute distress.   MSK: Left elbow large swelling posterior elbow.  No erythema.  Not particularly tender to palpation.    Lab and Radiology Results  Diagnostic Limited MSK Ultrasound of: Left posterior elbow Large hypoechoic fluid collection at olecranon bursa Impression: Olecranon bursitis     Assessment and Plan: 78 y.o. female with left posterior elbow swelling ongoing for about 2 weeks due to olecranon bursitis.  After discussion we will treat conservatively with compression and diclofenac  gel.  Recommend body he looks elbow sleeve.  Recheck in 3 weeks if not improving consider aspiration and injection.   PDMP not reviewed this encounter. Orders Placed This Encounter  Procedures   US  LIMITED JOINT SPACE STRUCTURES UP LEFT(NO LINKED CHARGES)    Reason for Exam (SYMPTOM  OR DIAGNOSIS REQUIRED):   left elbow pain    Preferred imaging location?:   [facility]   No orders of the defined types were placed in this encounter.    Discussed warning  signs or symptoms. Please see discharge instructions. Patient expresses understanding.   The above documentation has been reviewed and is accurate and complete Artist Lloyd, M.D.

## 2024-06-06 NOTE — Patient Instructions (Addendum)
 Thank you for coming in today.   Recommend Body Helix Elbow Compression Sleeve or ACE wrap.  Please use Voltaren  gel (Generic Diclofenac  Gel) up to 4x daily for pain as needed.  This is available over-the-counter as both the name brand Voltaren  gel and the generic diclofenac  gel.   See you back in 3 weeks.

## 2024-06-20 ENCOUNTER — Encounter: Payer: Self-pay | Admitting: Internal Medicine

## 2024-06-20 DIAGNOSIS — R29818 Other symptoms and signs involving the nervous system: Secondary | ICD-10-CM

## 2024-06-20 DIAGNOSIS — Z9189 Other specified personal risk factors, not elsewhere classified: Secondary | ICD-10-CM

## 2024-06-21 MED ORDER — WEGOVY 2.4 MG/0.75ML ~~LOC~~ SOAJ: 2.4000 mg | SUBCUTANEOUS | 5 refills | Status: AC

## 2024-06-26 ENCOUNTER — Other Ambulatory Visit: Payer: Self-pay | Admitting: Internal Medicine

## 2024-06-26 DIAGNOSIS — Z1231 Encounter for screening mammogram for malignant neoplasm of breast: Secondary | ICD-10-CM

## 2024-06-27 ENCOUNTER — Ambulatory Visit: Admitting: Family Medicine

## 2024-06-28 ENCOUNTER — Other Ambulatory Visit: Payer: Self-pay | Admitting: Internal Medicine

## 2024-06-28 DIAGNOSIS — R110 Nausea: Secondary | ICD-10-CM

## 2024-07-04 ENCOUNTER — Other Ambulatory Visit: Payer: Self-pay | Admitting: Internal Medicine

## 2024-07-04 DIAGNOSIS — E2839 Other primary ovarian failure: Secondary | ICD-10-CM

## 2024-07-08 ENCOUNTER — Other Ambulatory Visit: Payer: Self-pay

## 2024-07-08 DIAGNOSIS — M810 Age-related osteoporosis without current pathological fracture: Secondary | ICD-10-CM

## 2024-07-18 ENCOUNTER — Ambulatory Visit
Admission: RE | Admit: 2024-07-18 | Discharge: 2024-07-18 | Disposition: A | Discharge status: Home / Self Care | Source: Ambulatory Visit

## 2024-07-18 DIAGNOSIS — Z1231 Encounter for screening mammogram for malignant neoplasm of breast: Secondary | ICD-10-CM

## 2024-07-23 ENCOUNTER — Ambulatory Visit: Payer: Self-pay | Admitting: Internal Medicine

## 2024-08-02 ENCOUNTER — Other Ambulatory Visit: Payer: Self-pay | Admitting: Internal Medicine

## 2024-08-02 DIAGNOSIS — I4891 Unspecified atrial fibrillation: Secondary | ICD-10-CM

## 2024-08-05 ENCOUNTER — Other Ambulatory Visit: Payer: Self-pay | Admitting: Internal Medicine

## 2024-08-05 DIAGNOSIS — R062 Wheezing: Secondary | ICD-10-CM

## 2024-08-15 ENCOUNTER — Ambulatory Visit: Admitting: Internal Medicine

## 2024-08-15 ENCOUNTER — Encounter: Payer: Self-pay | Admitting: Internal Medicine

## 2024-08-15 VITALS — BP 110/70 | HR 94 | Temp 98.2°F | Ht 62.0 in | Wt 207.8 lb

## 2024-08-15 DIAGNOSIS — R062 Wheezing: Secondary | ICD-10-CM

## 2024-08-15 DIAGNOSIS — Z9189 Other specified personal risk factors, not elsewhere classified: Secondary | ICD-10-CM

## 2024-08-15 DIAGNOSIS — I10 Essential (primary) hypertension: Secondary | ICD-10-CM

## 2024-08-15 DIAGNOSIS — I4891 Unspecified atrial fibrillation: Secondary | ICD-10-CM

## 2024-08-15 DIAGNOSIS — Z789 Other specified health status: Secondary | ICD-10-CM

## 2024-08-15 DIAGNOSIS — L089 Local infection of the skin and subcutaneous tissue, unspecified: Secondary | ICD-10-CM

## 2024-08-15 DIAGNOSIS — R110 Nausea: Secondary | ICD-10-CM

## 2024-08-15 DIAGNOSIS — E559 Vitamin D deficiency, unspecified: Secondary | ICD-10-CM

## 2024-08-15 DIAGNOSIS — I5189 Other ill-defined heart diseases: Secondary | ICD-10-CM

## 2024-08-15 LAB — CBC WITH DIFFERENTIAL/PLATELET
Basophils Absolute: 0 K/uL (ref 0.0–0.1)
Basophils Relative: 0.5 % (ref 0.0–3.0)
Eosinophils Absolute: 0.1 K/uL (ref 0.0–0.7)
Eosinophils Relative: 1.3 % (ref 0.0–5.0)
HCT: 41.6 % (ref 36.0–46.0)
Hemoglobin: 14.1 g/dL (ref 12.0–15.0)
Lymphocytes Relative: 22.4 % (ref 12.0–46.0)
Lymphs Abs: 1.3 K/uL (ref 0.7–4.0)
MCHC: 34 g/dL (ref 30.0–36.0)
MCV: 92.8 fl (ref 78.0–100.0)
Monocytes Absolute: 0.4 K/uL (ref 0.1–1.0)
Monocytes Relative: 7 % (ref 3.0–12.0)
Neutro Abs: 4 K/uL (ref 1.4–7.7)
Neutrophils Relative %: 68.8 % (ref 43.0–77.0)
Platelets: 219 K/uL (ref 150.0–400.0)
RBC: 4.48 Mil/uL (ref 3.87–5.11)
RDW: 13.6 % (ref 11.5–15.5)
WBC: 5.8 K/uL (ref 4.0–10.5)

## 2024-08-15 LAB — MICROALBUMIN / CREATININE URINE RATIO
Creatinine,U: 261.7 mg/dL
Microalb Creat Ratio: 6.9 mg/g (ref 0.0–30.0)
Microalb, Ur: 1.8 mg/dL (ref 0.0–1.9)

## 2024-08-15 LAB — LIPID PANEL
Cholesterol: 150 mg/dL (ref 0–200)
HDL: 46.2 mg/dL (ref >39.00)
LDL Cholesterol: 82 mg/dL (ref 0–99)
NonHDL: 104
Total CHOL/HDL Ratio: 3
Triglycerides: 108 mg/dL (ref 0.0–149.0)
VLDL: 21.6 mg/dL (ref 0.0–40.0)

## 2024-08-15 LAB — VITAMIN D 25 HYDROXY (VIT D DEFICIENCY, FRACTURES): VITD: 29.95 ng/mL — ABNORMAL LOW (ref 30.00–100.00)

## 2024-08-15 LAB — COMPREHENSIVE METABOLIC PANEL WITH GFR
ALT: 18 U/L (ref 0–35)
AST: 26 U/L (ref 0–37)
Albumin: 4 g/dL (ref 3.5–5.2)
Alkaline Phosphatase: 95 U/L (ref 39–117)
BUN: 12 mg/dL (ref 6–23)
CO2: 32 meq/L (ref 19–32)
Calcium: 9.9 mg/dL (ref 8.4–10.5)
Chloride: 101 meq/L (ref 96–112)
Creatinine, Ser: 0.76 mg/dL (ref 0.40–1.20)
GFR: 75.96 mL/min (ref >60.00)
Glucose, Bld: 91 mg/dL (ref 70–99)
Potassium: 4.2 meq/L (ref 3.5–5.1)
Sodium: 140 meq/L (ref 135–145)
Total Bilirubin: 0.7 mg/dL (ref 0.2–1.2)
Total Protein: 7.2 g/dL (ref 6.0–8.3)

## 2024-08-15 LAB — HEMOGLOBIN A1C: Hgb A1c MFr Bld: 4.9 % (ref 4.6–6.5)

## 2024-08-15 MED ORDER — APIXABAN 5 MG PO TABS: 5.0000 mg | ORAL_TABLET | Freq: Two times a day (BID) | ORAL | 4 refills | Status: AC

## 2024-08-15 MED ORDER — ONDANSETRON 4 MG PO TBDP: 4.0000 mg | ORAL_TABLET | Freq: Three times a day (TID) | ORAL | 40 refills | Status: AC | PRN

## 2024-08-15 MED ORDER — ALBUTEROL SULFATE HFA 108 (90 BASE) MCG/ACT IN AERS: INHALATION_SPRAY | RESPIRATORY_TRACT | 4 refills | Status: AC

## 2024-08-15 MED ORDER — NEXLIZET 180-10 MG PO TABS: 1.0000 | ORAL_TABLET | Freq: Every day | ORAL | 4 refills | Status: AC

## 2024-08-15 MED ORDER — VITAMIN D (ERGOCALCIFEROL) 1.25 MG (50000 UNIT) PO CAPS: 50000.0000 [IU] | ORAL_CAPSULE | ORAL | 4 refills | Status: AC

## 2024-08-15 MED ORDER — DOXYCYCLINE HYCLATE 100 MG PO TABS: 100.0000 mg | ORAL_TABLET | Freq: Two times a day (BID) | ORAL | 0 refills | Status: AC

## 2024-08-15 MED ORDER — VERAPAMIL HCL 80 MG PO TABS: 80.0000 mg | ORAL_TABLET | Freq: Two times a day (BID) | ORAL | 4 refills | Status: AC

## 2024-08-15 MED ORDER — MUPIROCIN 2 % EX OINT: TOPICAL_OINTMENT | Freq: Two times a day (BID) | CUTANEOUS | 5 refills | Status: AC

## 2024-08-15 MED ORDER — LOSARTAN POTASSIUM-HCTZ 50-12.5 MG PO TABS: 1.0000 | ORAL_TABLET | Freq: Every day | ORAL | 4 refills | Status: AC

## 2024-08-15 MED ORDER — FUROSEMIDE 20 MG PO TABS: 20.0000 mg | ORAL_TABLET | Freq: Every day | ORAL | 4 refills | Status: AC

## 2024-08-15 NOTE — Assessment & Plan Note (Addendum)
 Will try to get nexlizet since insurance not covering Repatha .

## 2024-08-15 NOTE — Assessment & Plan Note (Addendum)
 Hypertension   Well-controlled- advised patient to half does of  Losartan -hydrochlorothiazide 50-12.5 mg daily.

## 2024-08-15 NOTE — Assessment & Plan Note (Addendum)
 Morbid obesity   Weight loss is ongoing and beneficial for heart condition management. Continue Wegovy  at 2.4 mg for weight management, with a 90-day supply refilled. Ondansetron  4 mg every 8 hours as needed for nausea is also refilled.

## 2024-08-15 NOTE — Assessment & Plan Note (Addendum)
 Grade II diastolic dysfunction   Chronic nonactive heart failure due to heart stiffening increases cardiovascular risk. There is no acute heart failure flare. Fluid management is crucial due to the heart's limited capacity to handle large volumes. Continue weight loss efforts and adopt a heart-healthy diet rich in fiber and extra virgin olive oil, considering a Mediterranean diet. Continue Apixaban  5 mg twice daily. Verapamil  80 mg twice daily is refilled for one year. An advanced cholesterol panel is ordered to potentially facilitate Repatha  coverage.  Following with cardiology and no active symptom(s) at this time

## 2024-08-15 NOTE — Assessment & Plan Note (Addendum)
 At risk for coronary artery disease   Increased risk due to grade II diastolic dysfunction and statin intolerance. Continue Apixaban  5 mg twice daily. An advanced cholesterol panel is ordered to potentially facilitate Repatha  coverage.

## 2024-08-15 NOTE — Assessment & Plan Note (Signed)
 Refills Zofran 

## 2024-08-15 NOTE — Progress Notes (Signed)
 ==============================  [facility]: [phone]   -- Medical Office Visit --  Patient: [name]      Age: 78 y.o.       Sex:  female  Date:   08/15/2024 Today's Healthcare Provider: Bernardino KANDICE Cone, MD  ==============================  Chief Complaint: Obesity Chronic disease monitoring   Discussed the use of AI scribe software for clinical note transcription with the patient, who gave verbal consent to proceed.  History of Present Illness 78 year old female who presents for a six-month follow-up visit for chronic disease management.  She has a history of cardiovascular risk factors, including grade two diastolic dysfunction and a stiffened heart wall. She cannot take statins and was previously denied Repatha  due to insurance issues. She has not experienced significant fluid retention except post-surgery.  She is currently taking Eliquis , which is covered by her insurance, and Wegovy  for weight management, having recently increased her dose to 2.4 mg. She has experienced significant weight loss, losing 32 pounds, and is focused on maintaining a heart-healthy diet, including the use of extra virgin olive oil and beets. She prefers a 90-day supply of her medications due to insurance coverage benefits and is interested in having antibiotics on hand for potential infections.  Her family history includes a daughter with ADHD and a history of concussions, who is currently experiencing personal and mental health challenges. She is concerned about her daughter's risky behavior and alcohol use, noting a family history of alcoholism.  She reports a history of anxiety, which she associates with stress related to her daughter, but otherwise does not feel it is a significant issue at present. No current fluid retention issues except post-surgery. Background Reviewed: Problem List: has PAF (paroxysmal atrial fibrillation) (HCC); Chronic  anticoagulation; DDD (degenerative disc disease), cervical; Hypertension; Heart murmur; Atrial fibrillation (HCC); Morbid obesity (HCC); Vitamin D  deficiency; Hemorrhoids; History of colon polyps; At risk for coronary artery disease; Statin intolerance; Drowsiness; Nonrheumatic aortic valve stenosis; Grade II diastolic dysfunction; Nausea; Suspected sleep apnea; and Olecranon bursitis, left elbow on their problem list. 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 Past Surgical History:   has a past surgical history that includes Abdominal hysterectomy (03/2010); Total knee arthroplasty (Bilateral); Cesarean section (1981); Rotator cuff repair (Left, 2010); and Endometrial biopsy. Social History:   reports that she has never smoked. She has never used smokeless tobacco. She reports current alcohol use. She reports that she does not use drugs. Family History:  family history includes ADD / ADHD in her daughter and daughter; Breast cancer in her mother, paternal aunt, and paternal aunt; Depression in her daughter, daughter, and daughter; Heart attack in her maternal grandfather; Stroke in her paternal aunt and another family member. Allergies:  is allergic to codeine, misc. sulfonamide containing compounds, and sulfa antibiotics.   Medication Reconciliation: Current Outpatient  Medications on File Prior to Visit  Medication Sig   acetaminophen (TYLENOL) 500 MG tablet 500 mg as needed.   semaglutide -weight management (WEGOVY ) 2.4 MG/0.75ML SOAJ SQ injection Inject 2.4 mg into the skin once a week.  No current facility-administered medications on file prior to visit.   Medications Discontinued During This Encounter  Medication Reason   semaglutide -weight management (WEGOVY ) 1.7 MG/0.75ML SOAJ SQ injection Completed Course   Semaglutide -Weight Management 0.25 MG/0.5ML SOAJ Dose change   liothyronine (CYTOMEL) 5 MCG tablet    mupirocin ointment (BACTROBAN) 2 % Reorder   losartan -hydrochlorothiazide (HYZAAR) 50-12.5 MG tablet Reorder   verapamil  (CALAN ) 80 MG tablet Reorder   Vitamin D , Ergocalciferol , (DRISDOL ) 1.25 MG (50000 UNIT) CAPS capsule Reorder   furosemide  (LASIX ) 20 MG tablet Reorder   ondansetron  (ZOFRAN -ODT) 4 MG disintegrating tablet Reorder   ELIQUIS  5 MG TABS tablet Reorder   albuterol  (VENTOLIN  HFA) 108 (90 Base) MCG/ACT inhaler Reorder    Physical Exam:    08/15/2024    9:52 AM 06/06/2024    3:12 PM 06/03/2024    1:57 PM  Vitals with BMI  Height 5' 2 5' 2   Weight 207 lbs 13 oz 219 lbs 217 lbs 6 oz  BMI 38 40.05   Systolic 110 118 893  Diastolic 70 76 70  Pulse 94 84 61  Vital signs reviewed.  Nursing notes reviewed. Weight trend reviewed. Physical Activity: Inactive (04/24/2024)   Exercise Vital Sign    Days of Exercise per Week: 0 days    Minutes of Exercise per Session: 0 min  General Appearance:  No acute distress appreciable.   Well-groomed, healthy-appearing female.  Well proportioned with no abnormal fat distribution.  Good muscle tone. Pulmonary:  Normal work of breathing at rest, no respiratory distress apparent. SpO2: 98 %  Musculoskeletal: All extremities are intact.  Neurological:  Awake, alert, oriented, and engaged.  No obvious focal neurological deficits or cognitive impairments.  Sensorium seems unclouded.   Speech is  clear and coherent with logical content. Psychiatric:  Appropriate mood, pleasant and cooperative demeanor, thoughtful and engaged during the exam  Results DIAGNOSTIC Echocardiogram: Grade 2 diastolic dysfunction, cardiac stiffness, chronic stable heart failure     06/03/2024    2:39 PM 04/24/2024    3:11 PM 10/02/2023    3:06 PM 10/02/2023    2:56 PM  PHQ 2/9 Scores  PHQ - 2 Score 0 0 0 0  PHQ- 9 Score 1         Data saved with a previous flowsheet row definition   Office Visit on 08/15/2024  Component Date Value Ref Range Status   WBC 08/15/2024 5.8  4.0 - 10.5 K/uL Final   RBC 08/15/2024 4.48  3.87 - 5.11 Mil/uL Final   Hemoglobin 08/15/2024 14.1  12.0 - 15.0 g/dL Final   HCT 87/88/7974 41.6  36.0 - 46.0 % Final   MCV 08/15/2024 92.8  78.0 - 100.0 fl Final   MCHC 08/15/2024 34.0  30.0 - 36.0 g/dL Final   RDW 87/88/7974 13.6  11.5 - 15.5 % Final   Platelets 08/15/2024 219.0  150.0 - 400.0 K/uL Final   Neutrophils Relative % 08/15/2024 68.8  43.0 - 77.0 % Final   Lymphocytes Relative 08/15/2024 22.4  12.0 - 46.0 % Final   Monocytes Relative 08/15/2024 7.0  3.0 - 12.0 % Final   Eosinophils Relative 08/15/2024 1.3  0.0 - 5.0 % Final   Basophils Relative 08/15/2024 0.5  0.0 - 3.0 % Final   Neutro Abs 08/15/2024 4.0  1.4 - 7.7 K/uL Final   Lymphs Abs 08/15/2024 1.3  0.7 - 4.0 K/uL Final   Monocytes Absolute 08/15/2024 0.4  0.1 - 1.0 K/uL Final   Eosinophils Absolute  08/15/2024 0.1  0.0 - 0.7 K/uL Final   Basophils Absolute 08/15/2024 0.0  0.0 - 0.1 K/uL Final   Sodium 08/15/2024 140  135 - 145 mEq/L Final   Potassium 08/15/2024 4.2  3.5 - 5.1 mEq/L Final   Chloride 08/15/2024 101  96 - 112 mEq/L Final   CO2 08/15/2024 32  19 - 32 mEq/L Final   Glucose, Bld 08/15/2024 91  70 - 99 mg/dL Final   BUN 87/88/7974 12  6 - 23 mg/dL Final   Creatinine, Ser 08/15/2024 0.76  0.40 - 1.20 mg/dL Final   Total Bilirubin 08/15/2024 0.7  0.2 - 1.2 mg/dL Final   Alkaline Phosphatase 08/15/2024  95  39 - 117 U/L Final   AST 08/15/2024 26  0 - 37 U/L Final   ALT 08/15/2024 18  0 - 35 U/L Final   Total Protein 08/15/2024 7.2  6.0 - 8.3 g/dL Final   Albumin 87/88/7974 4.0  3.5 - 5.2 g/dL Final   GFR 87/88/7974 75.96  >60.00 mL/min Final   Calcium  08/15/2024 9.9  8.4 - 10.5 mg/dL Final   Cholesterol 87/88/7974 150  0 - 200 mg/dL Final   Triglycerides 87/88/7974 108.0  0.0 - 149.0 mg/dL Final   HDL 87/88/7974 46.20  >39.00 mg/dL Final   VLDL 87/88/7974 21.6  0.0 - 40.0 mg/dL Final   LDL Cholesterol 08/15/2024 82  0 - 99 mg/dL Final   Total CHOL/HDL Ratio 08/15/2024 3   Final   NonHDL 08/15/2024 104.00   Final   Hgb A1c MFr Bld 08/15/2024 4.9  4.6 - 6.5 % Final   Microalb, Ur 08/15/2024 1.8  0.0 - 1.9 mg/dL Final   Creatinine,U 87/88/7974 261.7  mg/dL Final   Microalb Creat Ratio 08/15/2024 6.9  0.0 - 30.0 mg/g Final   VITD 08/15/2024 29.95 (L)  30.00 - 100.00 ng/mL Final  Hospital Outpatient Visit on 02/16/2024  Component Date Value Ref Range Status   Area-P 1/2 02/16/2024 3.56  cm2 Final   S' Lateral 02/16/2024 2.50  cm Final   AV Area mean vel 02/16/2024 1.61  cm2 Final   AR max vel 02/16/2024 1.67  cm2 Final   AV Area VTI 02/16/2024 1.59  cm2 Final   Ao pk vel 02/16/2024 2.36  m/s Final   AV Mean grad 02/16/2024 14.0  mmHg Final   AV Peak grad 02/16/2024 22.2  mmHg Final   Est EF 02/16/2024 55 - 60%   Final  Lab on 01/26/2024  Component Date Value Ref Range Status   TSH 01/26/2024 3.820  0.450 - 4.500 uIU/mL Final   WBC 01/26/2024 6.1  4.0 - 10.5 K/uL Final   RBC 01/26/2024 4.33  3.87 - 5.11 Mil/uL Final   Hemoglobin 01/26/2024 13.4  12.0 - 15.0 g/dL Final   HCT 94/76/7974 39.7  36.0 - 46.0 % Final   MCV 01/26/2024 91.8  78.0 - 100.0 fl Final   MCHC 01/26/2024 33.7  30.0 - 36.0 g/dL Final   RDW 94/76/7974 13.7  11.5 - 15.5 % Final   Platelets 01/26/2024 234.0  150.0 - 400.0 K/uL Final   Neutrophils Relative % 01/26/2024 63.5  43.0 - 77.0 % Final   Lymphocytes Relative  01/26/2024 27.1  12.0 - 46.0 % Final   Monocytes Relative 01/26/2024 7.2  3.0 - 12.0 % Final   Eosinophils Relative 01/26/2024 1.7  0.0 - 5.0 % Final   Basophils Relative 01/26/2024 0.5  0.0 - 3.0 % Final   Neutro Abs 01/26/2024  3.9  1.4 - 7.7 K/uL Final   Lymphs Abs 01/26/2024 1.6  0.7 - 4.0 K/uL Final   Monocytes Absolute 01/26/2024 0.4  0.1 - 1.0 K/uL Final   Eosinophils Absolute 01/26/2024 0.1  0.0 - 0.7 K/uL Final   Basophils Absolute 01/26/2024 0.0  0.0 - 0.1 K/uL Final   Sodium 01/26/2024 139  135 - 145 mEq/L Final   Potassium 01/26/2024 3.7  3.5 - 5.1 mEq/L Final   Chloride 01/26/2024 101  96 - 112 mEq/L Final   CO2 01/26/2024 30  19 - 32 mEq/L Final   Glucose, Bld 01/26/2024 85  70 - 99 mg/dL Final   BUN 94/76/7974 14  6 - 23 mg/dL Final   Creatinine, Ser 01/26/2024 0.71  0.40 - 1.20 mg/dL Final   Total Bilirubin 01/26/2024 0.4  0.2 - 1.2 mg/dL Final   Alkaline Phosphatase 01/26/2024 85  39 - 117 U/L Final   AST 01/26/2024 24  0 - 37 U/L Final   ALT 01/26/2024 15  0 - 35 U/L Final   Total Protein 01/26/2024 7.2  6.0 - 8.3 g/dL Final   Albumin 94/76/7974 4.1  3.5 - 5.2 g/dL Final   GFR 94/76/7974 82.75  >60.00 mL/min Final   Calcium  01/26/2024 9.8  8.4 - 10.5 mg/dL Final   Cholesterol 94/76/7974 156  0 - 200 mg/dL Final   Triglycerides 94/76/7974 129.0  0.0 - 149.0 mg/dL Final   HDL 94/76/7974 42.90  >39.00 mg/dL Final   VLDL 94/76/7974 25.8  0.0 - 40.0 mg/dL Final   LDL Cholesterol 01/26/2024 87  0 - 99 mg/dL Final   Total CHOL/HDL Ratio 01/26/2024 4   Final   NonHDL 01/26/2024 112.94   Final  Office Visit on 01/12/2024  Component Date Value Ref Range Status   Vit D, 25-Hydroxy 01/12/2024 49.6  30.0 - 100.0 ng/mL Final   Hgb A1c MFr Bld 01/12/2024 5.6  4.8 - 5.6 % Final   Est. average glucose Bld gHb Est-m* 01/12/2024 114  mg/dL Final  Office Visit on 10/18/2023  Component Date Value Ref Range Status   Influenza A, POC 10/18/2023 Negative  Negative Final   Influenza B,  POC 10/18/2023 Negative  Negative Final   SARS Coronavirus 2 Ag 10/18/2023 Negative  Negative Final  Office Visit on 10/02/2023  Component Date Value Ref Range Status   Color, UA 10/02/2023 yellow   Final   Clarity, UA 10/02/2023 clear   Final   Glucose, UA 10/02/2023 Negative  Negative Final   Bilirubin, UA 10/02/2023 negative   Final   Ketones, UA 10/02/2023 negative   Final   Spec Grav, UA 10/02/2023 1.025  1.010 - 1.025 Final   Blood, UA 10/02/2023 negative   Final   pH, UA 10/02/2023 5.0  5.0 - 8.0 Final   Protein, UA 10/02/2023 Negative  Negative Final   Urobilinogen, UA 10/02/2023 0.2  0.2 or 1.0 E.U./dL Final   Nitrite, UA 98/72/7974 negative   Final   Leukocytes, UA 10/02/2023 Negative  Negative Final  Office Visit on 12/09/2022  Component Date Value Ref Range Status   WBC 12/09/2022 6.2  4.0 - 10.5 K/uL Final   RBC 12/09/2022 4.54  3.87 - 5.11 Mil/uL Final   Platelets 12/09/2022 243.0  150.0 - 400.0 K/uL Final   Hemoglobin 12/09/2022 13.9  12.0 - 15.0 g/dL Final   HCT 95/94/7975 41.6  36.0 - 46.0 % Final   MCV 12/09/2022 91.5  78.0 - 100.0 fl Final   MCHC  12/09/2022 33.5  30.0 - 36.0 g/dL Final   RDW 95/94/7975 13.5  11.5 - 15.5 % Final   Sodium 12/09/2022 140  135 - 145 mEq/L Final   Potassium 12/09/2022 4.1  3.5 - 5.1 mEq/L Final   Chloride 12/09/2022 103  96 - 112 mEq/L Final   CO2 12/09/2022 30  19 - 32 mEq/L Final   Glucose, Bld 12/09/2022 103 (H)  70 - 99 mg/dL Final   BUN 95/94/7975 13  6 - 23 mg/dL Final   Creatinine, Ser 12/09/2022 0.70  0.40 - 1.20 mg/dL Final   Total Bilirubin 12/09/2022 0.7  0.2 - 1.2 mg/dL Final   Alkaline Phosphatase 12/09/2022 98  39 - 117 U/L Final   AST 12/09/2022 24  0 - 37 U/L Final   ALT 12/09/2022 18  0 - 35 U/L Final   Total Protein 12/09/2022 6.6  6.0 - 8.3 g/dL Final   Albumin 95/94/7975 3.9  3.5 - 5.2 g/dL Final   GFR 95/94/7975 84.84  >60.00 mL/min Final   Calcium  12/09/2022 9.2  8.4 - 10.5 mg/dL Final   Cholesterol  95/94/7975 174  0 - 200 mg/dL Final   Triglycerides 95/94/7975 99.0  0.0 - 149.0 mg/dL Final   HDL 95/94/7975 50.90  >39.00 mg/dL Final   VLDL 95/94/7975 19.8  0.0 - 40.0 mg/dL Final   LDL Cholesterol 12/09/2022 104 (H)  0 - 99 mg/dL Final   Total CHOL/HDL Ratio 12/09/2022 3   Final   NonHDL 12/09/2022 123.34   Final   Magnesium 12/09/2022 1.8  1.5 - 2.5 mg/dL Final   VITD 95/94/7975 40.59  30.00 - 100.00 ng/mL Final  Office Visit on 08/19/2022  Component Date Value Ref Range Status   SARS Coronavirus 2 Ag 08/19/2022 Positive (A)  Negative Final  No image results found. US  LIMITED JOINT SPACE STRUCTURES UP LEFT(NO LINKED CHARGES) Result Date: 07/24/2024  Diagnostic Limited MSK Ultrasound of: Left posterior elbow Large hypoechoic fluid collection at olecranon bursa Impression: Olecranon bursitis     MM 3D SCREENING MAMMOGRAM BILATERAL BREAST Result Date: 07/22/2024 CLINICAL DATA:  Screening. EXAM: DIGITAL SCREENING BILATERAL MAMMOGRAM WITH TOMOSYNTHESIS AND CAD TECHNIQUE: Bilateral screening digital craniocaudal and mediolateral oblique mammograms were obtained. Bilateral screening digital breast tomosynthesis was performed. The images were evaluated with computer-aided detection. COMPARISON:  Previous exam(s). ACR Breast Density Category b: There are scattered areas of fibroglandular density. FINDINGS: There are no findings suspicious for malignancy. IMPRESSION: No mammographic evidence of malignancy. A result letter of this screening mammogram will be mailed directly to the patient. RECOMMENDATION: Screening mammogram in one year. (Code:[id number]-B-01Y) BI-RADS CATEGORY  1: Negative. Electronically Signed   By: Craig Farr M.D.   On: 07/22/2024 08:41       ASSESSMENT & PLAN   Assessment & Plan Grade II diastolic dysfunction Atrial fibrillation, unspecified type (HCC) Grade II diastolic dysfunction   Chronic nonactive heart failure due to heart stiffening increases cardiovascular risk. There  is no acute heart failure flare. Fluid management is crucial due to the heart's limited capacity to handle large volumes. Continue weight loss efforts and adopt a heart-healthy diet rich in fiber and extra virgin olive oil, considering a Mediterranean diet. Continue Apixaban  5 mg twice daily. Verapamil  80 mg twice daily is refilled for one year. An advanced cholesterol panel is ordered to potentially facilitate Repatha  coverage.  Following with cardiology and no active symptom(s) at this time  Hypertension, unspecified type Hypertension   Well-controlled- advised patient to half does of  Losartan -hydrochlorothiazide 50-12.5 mg daily. Nausea Refills Zofran  Wheezing Refills albuterol   Vitamin D  deficiency Refills vitamin D  Shared decision-making done; patient understood rationale and agreed to [facility]  Skin infection Discussed potential antibiotic use for respiratory infections, emphasizing distinguishing between viral and bacterial infections. Prescribed Doxycycline for potential skin infections, including potential tick bites. Skin infection   Discussed use of topical antibiotics for skin infections. Prescribed Mupirocin ointment 2% for skin infections. At risk for coronary artery disease At risk for coronary artery disease   Increased risk due to grade II diastolic dysfunction and statin intolerance. Continue Apixaban  5 mg twice daily. An advanced cholesterol panel is ordered to potentially facilitate Repatha  coverage. Statin intolerance Will try to get nexlizet since insurance not covering Repatha . Morbid obesity (HCC) Morbid obesity   Weight loss is ongoing and beneficial for heart condition management. Continue Wegovy  at 2.4 mg for weight management, with a 90-day supply refilled. Ondansetron  4 mg every 8 hours as needed for nausea is also refilled.  ORDER ASSOCIATIONS  #   DIAGNOSIS / CONDITION ICD-10 ENCOUNTER ORDER     ICD-10-CM   1. Grade II diastolic dysfunction  I51.89  Bempedoic Acid-Ezetimibe (NEXLIZET) 180-10 MG TABS    furosemide  (LASIX ) 20 MG tablet    CBC with Differential/Platelet    Comprehensive metabolic panel with GFR    Lipid panel    Hemoglobin A1c    Microalbumin / creatinine urine ratio    Lipoprotein A (LPA)    Lipoprotein Fractionation, NMR    2. Atrial fibrillation, unspecified type (HCC)  I48.91 apixaban  (ELIQUIS ) 5 MG TABS tablet    verapamil  (CALAN ) 80 MG tablet    Lipoprotein A (LPA)    Lipoprotein Fractionation, NMR    3. Hypertension, unspecified type  I10 losartan -hydrochlorothiazide (HYZAAR) 50-12.5 MG tablet    Lipoprotein A (LPA)    Lipoprotein Fractionation, NMR    4. Nausea  R11.0 ondansetron  (ZOFRAN -ODT) 4 MG disintegrating tablet    5. Wheezing  R06.2 albuterol  (VENTOLIN  HFA) 108 (90 Base) MCG/ACT inhaler    6. Vitamin D  deficiency  E55.9 Vitamin D , Ergocalciferol , (DRISDOL ) 1.25 MG (50000 UNIT) CAPS capsule    Vitamin D  (25 hydroxy)    7. Skin infection  L08.9 doxycycline (VIBRA-TABS) 100 MG tablet    mupirocin ointment (BACTROBAN) 2 %    8. At risk for coronary artery disease  Z91.89 Lipoprotein A (LPA)    Lipoprotein Fractionation, NMR    9. Statin intolerance  Z78.9 Lipoprotein A (LPA)    Lipoprotein Fractionation, NMR    10. Morbid obesity (HCC)  E66.01          Orders Placed in Encounter:   Lab Orders         CBC with Differential/Platelet         Comprehensive metabolic panel with GFR         Lipid panel         Hemoglobin A1c         Microalbumin / creatinine urine ratio         Lipoprotein A (LPA)         Lipoprotein Fractionation, NMR         Vitamin D  (25 hydroxy)     Meds ordered this encounter  Medications   Bempedoic Acid-Ezetimibe (NEXLIZET) 180-10 MG TABS    Sig: Take 1 tablet by mouth daily at 6 (six) AM.    Dispense:  90 tablet    Refill:  4   apixaban  (ELIQUIS ) 5 MG  TABS tablet    Sig: Take 1 tablet (5 mg total) by mouth 2 (two) times daily.    Dispense:  180 tablet     Refill:  4   verapamil  (CALAN ) 80 MG tablet    Sig: Take 1 tablet (80 mg total) by mouth 2 (two) times daily.    Dispense:  180 tablet    Refill:  4    This prescription was filled on 09/09/2023. Any refills authorized will be placed on file.   losartan -hydrochlorothiazide (HYZAAR) 50-12.5 MG tablet    Sig: Take 1 tablet by mouth daily.    Dispense:  90 tablet    Refill:  4   ondansetron  (ZOFRAN -ODT) 4 MG disintegrating tablet    Sig: Take 1 tablet (4 mg total) by mouth every 8 (eight) hours as needed for nausea or vomiting.    Dispense:  90 tablet    Refill:  40   albuterol  (VENTOLIN  HFA) 108 (90 Base) MCG/ACT inhaler    Sig: INHALE 1 TO 2 PUFFS BY MOUTH EVERY 4 TO 6 HOURS AS NEEDED    Dispense:  54 g    Refill:  4   furosemide  (LASIX ) 20 MG tablet    Sig: Take 1 tablet (20 mg total) by mouth daily. 3 days a week as needed.    Dispense:  180 tablet    Refill:  4   Vitamin D , Ergocalciferol , (DRISDOL ) 1.25 MG (50000 UNIT) CAPS capsule    Sig: Take 1 capsule (50,000 Units total) by mouth every 7 (seven) days.    Dispense:  12 capsule    Refill:  4   doxycycline (VIBRA-TABS) 100 MG tablet    Sig: Take 1 tablet (100 mg total) by mouth 2 (two) times daily.    Dispense:  20 tablet    Refill:  0   mupirocin ointment (BACTROBAN) 2 %    Sig: Apply topically 2 (two) times daily.    Dispense:  22 g    Refill:  5     This document was synthesized by artificial intelligence ([employer]bridge) using HIPAA-compliant recording of the clinical interaction;   We discussed the use of AI scribe software for clinical note transcription with the patient, who gave verbal consent to proceed. additional Info: This encounter employed state-of-the-art, real-time, collaborative documentation. The patient actively reviewed and assisted in updating their electronic medical record on a shared screen, ensuring transparency and facilitating joint problem-solving for the problem list, overview, and plan. This approach  promotes accurate, informed care. The treatment plan was discussed and reviewed in detail, including medication safety, potential side effects, and all patient questions. We confirmed understanding and comfort with the plan. Follow-up instructions were established, including contacting the office for any concerns, returning if symptoms worsen, persist, or new symptoms develop, and precautions for potential emergency department visits.

## 2024-08-15 NOTE — Assessment & Plan Note (Signed)
 Grade II diastolic dysfunction   Chronic nonactive heart failure due to heart stiffening increases cardiovascular risk. There is no acute heart failure flare. Fluid management is crucial due to the heart's limited capacity to handle large volumes. Continue weight loss efforts and adopt a heart-healthy diet rich in fiber and extra virgin olive oil, considering a Mediterranean diet. Continue Apixaban  5 mg twice daily. Verapamil  80 mg twice daily is refilled for one year. An advanced cholesterol panel is ordered to potentially facilitate Repatha  coverage.  Following with cardiology and no active symptom(s) at this time

## 2024-08-15 NOTE — Assessment & Plan Note (Signed)
 Refills vitamin D  Shared decision-making done; patient understood rationale and agreed to [facility]

## 2024-08-15 NOTE — Patient Instructions (Addendum)
 It was a pleasure seeing you today! Your health and satisfaction are our top priorities.  Erika Cone, MD  VISIT SUMMARY: Today, you had a follow-up visit to manage your chronic health conditions. We discussed your cardiovascular health, weight management, blood pressure, and other concerns. You have been doing well with your weight loss and maintaining a heart-healthy diet. We also talked about your medication regimen and made some adjustments to ensure you have the necessary prescriptions.  YOUR PLAN: -GRADE II DIASTOLIC DYSFUNCTION: This condition means your heart has difficulty relaxing and filling with blood, which can increase your risk of heart problems. Continue your weight loss efforts and follow a heart-healthy diet, such as the Mediterranean diet. Keep taking Apixaban  5 mg twice daily and Verapamil  80 mg twice daily. We have ordered an advanced cholesterol panel to help with potential Repatha  coverage.  -MORBID OBESITY: This means having a body mass index (BMI) of 40 or higher, which can affect your overall health. Continue taking Wegovy  at 2.4 mg for weight management, and we have refilled your prescription for a 90-day supply. Ondansetron  4 mg every 8 hours as needed for nausea is also refilled.  -HYPERTENSION: This is high blood pressure, which can lead to other health issues if not managed. Continue taking Losartan -hydrochlorothiazide 50-12.5 mg daily.  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 We discussed the potential benefits of Repatha  and Nexlizet, though insurance may deny coverage. We have ordered an advanced cholesterol panel to help with potential Repatha  coverage.  -AT RISK FOR CORONARY ARTERY DISEASE: This means you have a higher chance of developing heart disease. Continue taking Apixaban  5 mg twice daily. We have ordered an advanced cholesterol panel to help with potential Repatha  coverage.  -VITAMIN D  DEFICIENCY:  This means you have lower than normal levels of vitamin D , which is important for bone health and overall well-being. Continue taking your vitamin D  supplements as prescribed.  -RESPIRATORY INFECTION: This refers to infections in your respiratory system, such as the lungs or airways. We discussed the use of antibiotics for bacterial infections and prescribed Doxycycline for respiratory infections, including potential tick bites.  -SKIN INFECTION: This refers to infections of the skin. We discussed the use of topical antibiotics and prescribed Mupirocin ointment 2% for skin infections.  INSTRUCTIONS: Please follow up with the advanced cholesterol panel as ordered. Continue with your current medications and lifestyle changes. If you experience any new symptoms or have concerns, schedule an appointment.  Your Providers PCP: Clark Erika MATSU, MD,  [phone]) Referring Provider: Cone Erika MATSU, MD,  [phone]) Care Team Provider: Elicia Claw, MD,  [phone]) Care Team Provider: [facility],  [phone]) Care Team Provider: Sheena Pugh, [place],  [phone])  NEXT STEPS: [x]  Early Intervention: Schedule sooner appointment, call our on-call services, or go to emergency room if there is any significant Increase in pain or discomfort New or worsening symptoms Sudden or severe changes in your health [x]  Flexible Follow-Up: We recommend a Return in about 6 months (around 02/13/2025). for optimal routine care. This allows for progress monitoring and treatment adjustments. [x]  Preventive Care: Schedule your annual preventive care visit! It's typically covered by insurance and helps identify potential health issues early. [x]  Lab & X-ray Appointments: Incomplete tests scheduled today, or call to schedule. X-rays: [facility] (M-F, 8:30am-noon or 1pm-5pm). [x]  Medical Information Release: Sign a release form at front desk to obtain relevant medical  information we don't have.  MAKING THE MOST  OF OUR FOCUSED 20 MINUTE APPOINTMENTS: [x]   Clearly state your top concerns at the beginning of the visit to focus our discussion [x]   If you anticipate you will need more time, please inform the front desk during scheduling - we can book multiple appointments in the same week. [x]   If you have transportation problems- use our convenient video appointments or ask about transportation support. [x]   We can get down to business faster if you use MyChart to update information before the visit and submit non-urgent questions before your visit. Thank you for taking the time to provide details through MyChart.  Let our nurse know and she can import this information into your encounter documents.  Arrival and Wait Times: [x]   Arriving on time ensures that everyone receives prompt attention. [x]   Early morning (8a) and afternoon (1p) appointments tend to have shortest wait times. [x]   Unfortunately, we cannot delay appointments for late arrivals or hold slots during phone calls.  Getting Answers and Following Up [x]   Simple Questions & Concerns: For quick questions or basic follow-up after your visit, reach us  at [phone] or MyChart messaging. [x]   Complex Concerns: If your concern is more complex, scheduling an appointment might be best. Discuss this with the staff to find the most suitable option. [x]   Lab & Imaging Results: We'll contact you directly if results are abnormal or you don't use MyChart. Most normal results will be on MyChart within 2-3 business days, with a review message from Dr. Jesus. Haven't heard back in 2 weeks? Need results sooner? Contact us  at [phone]. [x]   Referrals: Our referral coordinator will manage specialist referrals. The specialist's office should contact you within 2 weeks to schedule an appointment. Call us  if you haven't heard from them after 2 weeks.  Staying Connected [x]   MyChart: Activate your MyChart for  the fastest way to access results and message us . See the last page of this paperwork for instructions on how to activate.  Bring to Your Next Appointment [x]   Medications: Please bring all your medication bottles to your next appointment to ensure we have an accurate record of your prescriptions. [x]   Health Diaries: If you're monitoring any health conditions at home, keeping a diary of your readings can be very helpful for discussions at your next appointment.  Billing [x]   X-ray & Lab Orders: These are billed by separate companies. Contact the invoicing company directly for questions or concerns. [x]   Visit Charges: Discuss any billing inquiries with our administrative services team.  Your Satisfaction Matters [x]   Share Your Experience: We strive for your satisfaction! If you have any complaints, or preferably compliments, please let Dr. Jesus know directly or contact our Practice Administrators, Manuelita Rubin or [employer], by asking at the front desk.   Reviewing Your Records [x]   Review this early draft of your clinical encounter notes below and the final encounter summary tomorrow on MyChart after its been completed.  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 There is no acute heart failure flare. Fluid management is crucial due to the heart's limited capacity to handle large volumes. Continue weight loss efforts and adopt a heart-healthy diet rich in fiber and extra virgin olive oil, considering a Mediterranean diet. Continue Apixaban  5 mg twice daily. Verapamil  80 mg twice daily is refilled for one year. An advanced cholesterol  panel is ordered to potentially facilitate Repatha  coverage.  Orders: -     Nexlizet; Take 1 tablet by mouth daily at 6 (six) AM.  Dispense: 90 tablet; Refill: 4 -     Furosemide ; Take  1 tablet (20 mg total) by mouth daily. 3 days a week as needed.  Dispense: 180 tablet; Refill: 4 -     CBC with Differential/Platelet -     Comprehensive metabolic panel with GFR -     Lipid panel -     Hemoglobin A1c -     Microalbumin / creatinine urine ratio -     Lipoprotein A (LPA) -     Lipoprotein Fractionation, NMR  Atrial fibrillation, unspecified type (HCC) -     Apixaban ; Take 1 tablet (5 mg total) by mouth 2 (two) times daily.  Dispense: 180 tablet; Refill: 4 -     Verapamil  HCl; Take 1 tablet (80 mg total) by mouth 2 (two) times daily.  Dispense: 180 tablet; Refill: 4 -     Lipoprotein A (LPA) -     Lipoprotein Fractionation, NMR  Hypertension, unspecified type Assessment & Plan: Hypertension   Well-controlled- advised patient to half does of  Losartan -hydrochlorothiazide 50-12.5 mg daily.  Orders: -     Losartan  Potassium-HCTZ; Take 1 tablet by mouth daily.  Dispense: 90 tablet; Refill: 4 -     Lipoprotein A (LPA) -     Lipoprotein Fractionation, NMR  Nausea -     Ondansetron ; Take 1 tablet (4 mg total) by mouth every 8 (eight) hours as needed for nausea or vomiting.  Dispense: 90 tablet; Refill: 40  Wheezing -     Albuterol  Sulfate HFA; INHALE 1 TO 2 PUFFS BY MOUTH EVERY 4 TO 6 HOURS AS NEEDED  Dispense: 54 g; Refill: 4  Vitamin D  deficiency -     Vitamin D  (Ergocalciferol ); Take 1 capsule (50,000 Units total) by mouth every 7 (seven) days.  Dispense: 12 capsule; Refill: 4 -     VITAMIN D  25 Hydroxy (Vit-D Deficiency, Fractures)  Skin infection -     Doxycycline Hyclate; Take 1 tablet (100 mg total) by mouth 2 (two) times daily.  Dispense: 20 tablet; Refill: 0 -     Mupirocin; Apply topically 2 (two) times daily.  Dispense: 22 g; Refill: 5  At risk for coronary artery disease Assessment & Plan: At risk for coronary artery disease   Increased risk due to grade II diastolic dysfunction and statin intolerance. Continue Apixaban  5 mg twice daily. An advanced  cholesterol panel is ordered to potentially facilitate Repatha  coverage.  Orders: -     Lipoprotein A (LPA) -     Lipoprotein Fractionation, NMR  Statin intolerance Assessment & Plan: Will try to get nexlizet since insurance not covering Repatha .  Orders: -     Lipoprotein A (LPA) -     Lipoprotein Fractionation, NMR  Morbid obesity (HCC) Assessment & Plan: Morbid obesity   Weight loss is ongoing and beneficial for heart condition management. Continue Wegovy  at 2.4 mg for weight management, with a 90-day supply refilled. Ondansetron  4 mg every 8 hours as needed for nausea is also refilled.

## 2024-08-18 ENCOUNTER — Ambulatory Visit: Payer: Self-pay | Admitting: Internal Medicine

## 2024-08-20 ENCOUNTER — Telehealth: Payer: Self-pay

## 2024-08-20 ENCOUNTER — Other Ambulatory Visit (HOSPITAL_COMMUNITY): Payer: Self-pay

## 2024-08-20 NOTE — Telephone Encounter (Signed)
 Pharmacy Patient Advocate Encounter   Received notification from Onbase that prior authorization for Nexlizet  180-10MG  tablets is required/requested.   Insurance verification completed.   The patient is insured through [facility] ADVANTAGE/RX ADVANCE.   Per test claim: PA required; PA submitted to above mentioned insurance via Latent Key/confirmation #/EOC [facility] Status is pending

## 2024-08-21 LAB — LIPOPROTEIN FRACTIONATION, NMR
HDL P: 33 umol/L (ref >32.8)
HDL Size: 8.9 nm — ABNORMAL LOW (ref >9.0)
LARGE HDL P: 5 umol/L — ABNORMAL LOW (ref >7.2)
LARGE VLDL P: 3 nmol/L (ref <3.7)
LDL P: 1399 nmol/L — ABNORMAL HIGH (ref <935)
LDL SIZE: 21 nm (ref >20.5)
SMALL LDL P: 603 nmol/L — ABNORMAL HIGH (ref <467)
VLDL Size: 47.5 nm — ABNORMAL HIGH (ref <47.1)

## 2024-08-21 NOTE — Telephone Encounter (Signed)
 Pharmacy Patient Advocate Encounter  Received notification from [facility] ADVANTAGE/RX ADVANCE that Prior Authorization for  Nexlizet  180-10MG  tablets  has been APPROVED from 08/20/24 to 02/18/25   PA #/Case ID/Reference #: [id number]

## 2024-08-22 DIAGNOSIS — E7841 Elevated Lipoprotein(a): Secondary | ICD-10-CM | POA: Insufficient documentation

## 2024-08-22 LAB — LIPOPROTEIN A (LPA): Lipoprotein (a): 121 nmol/L — ABNORMAL HIGH (ref <75)

## 2024-08-22 MED ORDER — REPATHA SURECLICK 140 MG/ML ~~LOC~~ SOAJ: 140.0000 mg | SUBCUTANEOUS | 2 refills | Status: AC

## 2024-08-23 ENCOUNTER — Other Ambulatory Visit (HOSPITAL_COMMUNITY): Payer: Self-pay

## 2024-08-23 ENCOUNTER — Telehealth: Payer: Self-pay

## 2024-08-23 NOTE — Telephone Encounter (Signed)
 Pharmacy Patient Advocate Encounter   Received notification from Onbase that prior authorization for Evolocumab  (REPATHA  SURECLICK) 140 MG/ML SOAJ  is required/requested.   Insurance verification completed.   The patient is insured through [facility] ADVANTAGE/RX ADVANCE.   Per test claim: PA required; PA submitted to above mentioned insurance via Latent Key/confirmation #/EOC [id number] Status is pending

## 2024-08-27 NOTE — Telephone Encounter (Signed)
 Patient notified of PA approval via MyChart

## 2024-08-27 NOTE — Telephone Encounter (Signed)
 Pharmacy Patient Advocate Encounter  Received notification from [facility] ADVANTAGE/RX ADVANCE that Prior Authorization for Repatha  SureClick 140MG /ML auto-injectors  has been APPROVED from 08/26/2024 to 02/22/2025   PA #/Case ID/Reference #: [id number]

## 2025-01-24 ENCOUNTER — Ambulatory Visit: Admitting: Cardiology
